# Patient Record
Sex: Female | Born: 1960 | Race: Black or African American | Hispanic: No | Smoking: Never smoker
Health system: Southern US, Community
[De-identification: ages and names within clinical notes are randomized; demographics above are authoritative.]

## PROBLEM LIST (undated history)

## (undated) ENCOUNTER — Emergency Department (HOSPITAL_COMMUNITY)

## (undated) DIAGNOSIS — M1990 Unspecified osteoarthritis, unspecified site: Secondary | ICD-10-CM

## (undated) DIAGNOSIS — J309 Allergic rhinitis, unspecified: Secondary | ICD-10-CM

## (undated) DIAGNOSIS — Z789 Other specified health status: Secondary | ICD-10-CM

## (undated) DIAGNOSIS — R519 Headache, unspecified: Secondary | ICD-10-CM

## (undated) DIAGNOSIS — R51 Headache: Secondary | ICD-10-CM

## (undated) HISTORY — DX: Allergic rhinitis, unspecified: J30.9

---

## 1997-12-15 ENCOUNTER — Ambulatory Visit (HOSPITAL_COMMUNITY): Admission: RE | Admit: 1997-12-15 | Discharge: 1997-12-15 | Payer: Self-pay | Admitting: Obstetrics

## 1997-12-15 ENCOUNTER — Other Ambulatory Visit: Admission: RE | Admit: 1997-12-15 | Discharge: 1997-12-15 | Payer: Self-pay | Admitting: Obstetrics

## 1999-01-25 ENCOUNTER — Other Ambulatory Visit: Admission: RE | Admit: 1999-01-25 | Discharge: 1999-01-25 | Payer: Self-pay | Admitting: Obstetrics and Gynecology

## 1999-03-24 ENCOUNTER — Emergency Department (HOSPITAL_COMMUNITY): Admission: EM | Admit: 1999-03-24 | Discharge: 1999-03-24 | Payer: Self-pay | Admitting: Emergency Medicine

## 1999-03-25 ENCOUNTER — Encounter: Payer: Self-pay | Admitting: Emergency Medicine

## 2002-04-08 ENCOUNTER — Ambulatory Visit (HOSPITAL_COMMUNITY): Admission: RE | Admit: 2002-04-08 | Discharge: 2002-04-08 | Payer: Self-pay | Admitting: Family Medicine

## 2002-04-08 ENCOUNTER — Encounter: Payer: Self-pay | Admitting: Cardiology

## 2005-05-10 ENCOUNTER — Ambulatory Visit (HOSPITAL_COMMUNITY): Admission: RE | Admit: 2005-05-10 | Discharge: 2005-05-10 | Payer: Self-pay | Admitting: Internal Medicine

## 2007-10-16 ENCOUNTER — Encounter: Admission: RE | Admit: 2007-10-16 | Discharge: 2007-10-16 | Payer: Self-pay | Admitting: Family Medicine

## 2007-10-29 ENCOUNTER — Emergency Department (HOSPITAL_COMMUNITY): Admission: EM | Admit: 2007-10-29 | Discharge: 2007-10-30 | Payer: Self-pay | Admitting: Emergency Medicine

## 2007-11-19 ENCOUNTER — Other Ambulatory Visit: Admission: RE | Admit: 2007-11-19 | Discharge: 2007-11-19 | Payer: Self-pay | Admitting: Family Medicine

## 2008-11-25 ENCOUNTER — Ambulatory Visit (HOSPITAL_COMMUNITY): Admission: RE | Admit: 2008-11-25 | Discharge: 2008-11-25 | Payer: Self-pay | Admitting: Family Medicine

## 2010-10-08 ENCOUNTER — Emergency Department (HOSPITAL_COMMUNITY)
Admission: EM | Admit: 2010-10-08 | Discharge: 2010-10-08 | Disposition: A | Discharge status: Home / Self Care | Payer: BC Managed Care – PPO | Attending: Emergency Medicine | Admitting: Emergency Medicine

## 2010-10-08 DIAGNOSIS — W260XXA Contact with knife, initial encounter: Secondary | ICD-10-CM | POA: Insufficient documentation

## 2010-10-08 DIAGNOSIS — W261XXA Contact with sword or dagger, initial encounter: Secondary | ICD-10-CM | POA: Insufficient documentation

## 2010-10-08 DIAGNOSIS — Y92009 Unspecified place in unspecified non-institutional (private) residence as the place of occurrence of the external cause: Secondary | ICD-10-CM | POA: Insufficient documentation

## 2010-10-08 DIAGNOSIS — S51809A Unspecified open wound of unspecified forearm, initial encounter: Secondary | ICD-10-CM | POA: Insufficient documentation

## 2010-10-17 ENCOUNTER — Inpatient Hospital Stay (INDEPENDENT_AMBULATORY_CARE_PROVIDER_SITE_OTHER)
Admission: RE | Admit: 2010-10-17 | Discharge: 2010-10-17 | Disposition: A | Discharge status: Home / Self Care | Payer: BC Managed Care – PPO | Source: Ambulatory Visit | Attending: Emergency Medicine | Admitting: Emergency Medicine

## 2010-10-17 DIAGNOSIS — Z4802 Encounter for removal of sutures: Secondary | ICD-10-CM

## 2011-03-07 LAB — POCT I-STAT, CHEM 8
BUN: 7
Calcium, Ion: 1.06 — ABNORMAL LOW
Chloride: 106
Creatinine, Ser: 0.6
TCO2: 23

## 2011-03-07 LAB — CBC
MCHC: 34.2
MCV: 94.3
Platelets: 210
RDW: 13.5
WBC: 10.3

## 2011-03-07 LAB — DIFFERENTIAL
Basophils Absolute: 0
Basophils Relative: 0
Eosinophils Absolute: 0.1
Eosinophils Relative: 1
Neutrophils Relative %: 81 — ABNORMAL HIGH

## 2011-03-07 LAB — D-DIMER, QUANTITATIVE: D-Dimer, Quant: 0.3

## 2011-03-07 LAB — POCT CARDIAC MARKERS
Myoglobin, poc: 38
Operator id: 277751
Troponin i, poc: <0.05

## 2012-02-02 ENCOUNTER — Encounter (HOSPITAL_COMMUNITY): Payer: Self-pay | Admitting: *Deleted

## 2012-02-02 ENCOUNTER — Inpatient Hospital Stay (HOSPITAL_COMMUNITY)
Admission: AD | Admit: 2012-02-02 | Discharge: 2012-02-02 | Disposition: A | Discharge status: Home / Self Care | Payer: BC Managed Care – PPO | Source: Ambulatory Visit | Attending: Obstetrics & Gynecology | Admitting: Obstetrics & Gynecology

## 2012-02-02 DIAGNOSIS — R51 Headache: Secondary | ICD-10-CM | POA: Insufficient documentation

## 2012-02-02 DIAGNOSIS — R110 Nausea: Secondary | ICD-10-CM | POA: Insufficient documentation

## 2012-02-02 DIAGNOSIS — R197 Diarrhea, unspecified: Secondary | ICD-10-CM | POA: Insufficient documentation

## 2012-02-02 DIAGNOSIS — A084 Viral intestinal infection, unspecified: Secondary | ICD-10-CM

## 2012-02-02 DIAGNOSIS — R109 Unspecified abdominal pain: Secondary | ICD-10-CM | POA: Insufficient documentation

## 2012-02-02 HISTORY — DX: Other specified health status: Z78.9

## 2012-02-02 LAB — URINALYSIS, ROUTINE W REFLEX MICROSCOPIC
Bilirubin Urine: NEGATIVE
Glucose, UA: NEGATIVE mg/dL
Ketones, ur: >80 mg/dL — AB
Leukocytes, UA: NEGATIVE
Nitrite: NEGATIVE
Protein, ur: NEGATIVE mg/dL
Specific Gravity, Urine: >1.03 — ABNORMAL HIGH (ref 1.005–1.030)
Urobilinogen, UA: 0.2 mg/dL (ref 0.0–1.0)
pH: 6 (ref 5.0–8.0)

## 2012-02-02 LAB — CBC WITH DIFFERENTIAL/PLATELET
Basophils Relative: 0 % (ref 0–1)
Eosinophils Absolute: 0 10*3/uL (ref 0.0–0.7)
Eosinophils Relative: 0 % (ref 0–5)
Hemoglobin: 12.7 g/dL (ref 12.0–15.0)
Lymphs Abs: 0.7 10*3/uL (ref 0.7–4.0)
MCH: 29.8 pg (ref 26.0–34.0)
MCHC: 32.2 g/dL (ref 30.0–36.0)
MCV: 92.5 fL (ref 78.0–100.0)
Monocytes Absolute: 0.6 10*3/uL (ref 0.1–1.0)
Monocytes Relative: 8 % (ref 3–12)
Neutrophils Relative %: 85 % — ABNORMAL HIGH (ref 43–77)
RBC: 4.26 MIL/uL (ref 3.87–5.11)

## 2012-02-02 LAB — URINE MICROSCOPIC-ADD ON

## 2012-02-02 LAB — BASIC METABOLIC PANEL
BUN: 10 mg/dL (ref 6–23)
Calcium: 8.3 mg/dL — ABNORMAL LOW (ref 8.4–10.5)
Creatinine, Ser: 0.52 mg/dL (ref 0.50–1.10)
GFR calc Af Amer: >90 mL/min (ref >90)
GFR calc non Af Amer: >90 mL/min (ref >90)
Glucose, Bld: 101 mg/dL — ABNORMAL HIGH (ref 70–99)

## 2012-02-02 MED ORDER — LOPERAMIDE HCL 2 MG PO CAPS
2.0000 mg | ORAL_CAPSULE | ORAL | Status: DC | PRN
  Administered 2012-02-02: 2 mg via ORAL
  Filled 2012-02-02: qty 1
  Filled 2012-02-02: qty 2

## 2012-02-02 MED ORDER — ACETAMINOPHEN 325 MG PO TABS
325.0000 mg | ORAL_TABLET | Freq: Once | ORAL | Status: AC
  Administered 2012-02-02: 325 mg via ORAL
  Filled 2012-02-02 (×2): qty 1

## 2012-02-02 MED ORDER — SODIUM CHLORIDE 0.9 % IV SOLN
25.0000 mg | Freq: Once | INTRAVENOUS | Status: AC
  Administered 2012-02-02: 25 mg via INTRAVENOUS
  Filled 2012-02-02: qty 1

## 2012-02-02 MED ORDER — LOPERAMIDE HCL 2 MG PO CAPS: 2.0000 mg | ORAL_CAPSULE | Freq: Four times a day (QID) | ORAL | Status: DC | PRN

## 2012-02-02 MED ORDER — LOPERAMIDE HCL 2 MG PO CAPS: 2.0000 mg | ORAL_CAPSULE | Freq: Four times a day (QID) | ORAL | Status: AC | PRN

## 2012-02-02 MED ORDER — DEXTROSE-NACL 5-0.9 % IV SOLN: INTRAVENOUS | Status: DC

## 2012-02-02 MED ORDER — ONDANSETRON 8 MG PO TBDP: 8.0000 mg | ORAL_TABLET | Freq: Three times a day (TID) | ORAL | Status: AC | PRN

## 2012-02-02 MED ORDER — PEDIALYTE PO SOLN: 240.0000 mL | Freq: Four times a day (QID) | ORAL | Status: DC

## 2012-02-02 NOTE — MAU Note (Signed)
In to give PO meds and pt vomited large amt in trash can. Will give IVFs . Pharmacy called at 0725 about IVFs.

## 2012-02-02 NOTE — MAU Note (Signed)
Woke up Friday with h/a, nausea, diarrhea. Didn't eat all day Friday. Did drink ginger ale and water on Fri

## 2012-02-02 NOTE — MAU Provider Note (Signed)
History     CSN: [id number]  Arrival date and time: 02/02/12 7829   First Provider Initiated Contact with Patient 02/02/12 [phone]      Chief Complaint  Patient presents with  . Headache  . Nausea  . Diarrhea   HPI This is a 51 y.o. [id number] here with nausea and diarrhea x 3 since yesterday morning.  Pt states she has been unable to eat but has tried drinking fluids throughout the day yesterday.  Has abdominal pain though cannot elaborate quality or quantity.  Menstruation also started yesterday morning, but never had these symptoms with prior menstruation, LMP 6 mo ago (going through menopause).  Nothing makes pt feel better or worse.  Has sick contact of pregnant daughter with similar symptoms (nausea/diarrhea) 2 days ago who came to MAU, given IV fluids and immodium, and sent home, no longer with diarrhea but still feeling poorly.    Also reports fatigue, chills, subjective fevers, chronic epigastric pain, 2 mo of back pain and chronic frontal headache at 4-5/10.  Denies neurologic changes other than chronic blurry vision.  Also denies confusion, upper respiratory symptoms, vomiting,   Pt somewhat a poor historian, ?chronically v due to not feeling well now.  Mentation at baseline per daughter.  PCP - Dr. Parke Simmers on [facility].  Past Medical History  Diagnosis Date  . No pertinent past medical history   Irregular menstruation with LMP 6 mo ago.  Past Surgical History  Procedure Date  . Cesarean section   x 3, no other surgeries  Family History  Problem Relation Age of Onset  . Other Neg Hx   Mother - esophageal cancer at 61 yo Maternal GM - DM, cerebral aneurysm  History  Substance Use Topics  .    Marland Kitchen Smokeless tobacco: Not on file  . Alcohol Use: No  Smoker x ~1 year years ago Lives with 3 daughters and their children, 6 people total, denies DV Works at [employer]  Allergies:  Allergies  Allergen Reactions  . Latex Rash    No prescriptions prior to admission      ROS Per HPI; otherwise, negative Physical Exam   Blood pressure 122/70, pulse 93, temperature 99.3 F (37.4 C), temperature source Oral, resp. rate 20, last menstrual period 02/01/2012.  Physical Exam GEN: AA female lying in bed in NAD, though appears fatigued and answers questions with some prompting HEENT: PERRL, EOMI, dry mucous membranes CV: RRR, 2+ bilateral DP pulses, brisk capillary refill PULM: CTAB, no increased WOB ABD: soft, mild tenderness to palpation diffusely, nondistended, slightly hyperactive bowel sounds EXTR: no bilateral LE edema NEURO: CN 2-12 tested and in tact, alert and oriented x 3   MAU Course  Procedures  MDM  Results for orders placed during the hospital encounter of 02/02/12 (from the past 24 hour(s))  URINALYSIS, ROUTINE W REFLEX MICROSCOPIC     Status: Abnormal   Collection Time   02/02/12  5:45 AM      Component Value Range   Color, Urine YELLOW  YELLOW   APPearance CLEAR  CLEAR   Specific Gravity, Urine >1.030 (*) 1.005 - 1.030   pH 6.0  5.0 - 8.0   Glucose, UA NEGATIVE  NEGATIVE mg/dL   Hgb urine dipstick LARGE (*) NEGATIVE   Bilirubin Urine NEGATIVE  NEGATIVE   Ketones, ur >80 (*) NEGATIVE mg/dL   Protein, ur NEGATIVE  NEGATIVE mg/dL   Urobilinogen, UA 0.2  0.0 - 1.0 mg/dL   Nitrite NEGATIVE  NEGATIVE   Leukocytes, UA NEGATIVE  NEGATIVE  URINE MICROSCOPIC-ADD ON     Status: Abnormal   Collection Time   02/02/12  5:45 AM      Component Value Range   Squamous Epithelial / LPF FEW (*) RARE   WBC, UA 0-2  <3 WBC/hpf   RBC / HPF 21-50  <3 RBC/hpf   Bacteria, UA FEW (*) RARE   Urine-Other MUCOUS PRESENT     - Ordered tylenol for headache, imodium, and rehydrating with IV fluids with phenergan. - CBC and BMET ordered  Assessment and Plan   This is a 51 y.o. [id number] here with nausea and diarrhea x 3 since yesterday morning.  # Diarrhea and abdominal pain: Likely a viral gastroenteritis given symptoms and sick contact.  Pt  clinically appears dehydrated given dry mucous membranes and concentrated urine.  - 1L NS with phenergan in MAU - CBC and BMET are WNL - imodium for diarrhea - expectant management and encourage hydration with gatorade or pedialyte - f/u with PCP if no improvement in 1-2 days  # Headache, chronic: Consider migraine  - tylenol in MAU - f/u with PCP Dr. Parke Simmers, as this appears chronic with no current worsening or neurologic symptoms  # Health maintenance: - f/u chronic epigastric pain with PCP - potential GERD?  [name] 02/02/2012, 6:50 AM

## 2012-02-08 NOTE — MAU Provider Note (Signed)
I examined pt and agree with documentation above and resident plan of care. MUHAMMAD,Jauan Wohl  

## 2012-07-15 ENCOUNTER — Other Ambulatory Visit (HOSPITAL_COMMUNITY)
Admission: RE | Admit: 2012-07-15 | Discharge: 2012-07-15 | Disposition: A | Discharge status: Home / Self Care | Payer: BC Managed Care – PPO | Source: Ambulatory Visit | Attending: Family Medicine | Admitting: Family Medicine

## 2012-07-15 ENCOUNTER — Other Ambulatory Visit: Payer: Self-pay | Admitting: Family Medicine

## 2012-07-15 DIAGNOSIS — Z01419 Encounter for gynecological examination (general) (routine) without abnormal findings: Secondary | ICD-10-CM | POA: Insufficient documentation

## 2012-07-15 DIAGNOSIS — Z1151 Encounter for screening for human papillomavirus (HPV): Secondary | ICD-10-CM | POA: Insufficient documentation

## 2012-09-07 ENCOUNTER — Emergency Department (HOSPITAL_COMMUNITY)
Admission: EM | Admit: 2012-09-07 | Discharge: 2012-09-07 | Disposition: A | Discharge status: Home / Self Care | Payer: BC Managed Care – PPO | Attending: Emergency Medicine | Admitting: Emergency Medicine

## 2012-09-07 ENCOUNTER — Encounter (HOSPITAL_COMMUNITY): Payer: Self-pay | Admitting: *Deleted

## 2012-09-07 DIAGNOSIS — R21 Rash and other nonspecific skin eruption: Secondary | ICD-10-CM | POA: Insufficient documentation

## 2012-09-07 DIAGNOSIS — IMO0002 Reserved for concepts with insufficient information to code with codable children: Secondary | ICD-10-CM | POA: Insufficient documentation

## 2012-09-07 DIAGNOSIS — L03114 Cellulitis of left upper limb: Secondary | ICD-10-CM

## 2012-09-07 MED ORDER — SULFAMETHOXAZOLE-TRIMETHOPRIM 800-160 MG PO TABS: 1.0000 | ORAL_TABLET | Freq: Two times a day (BID) | ORAL | Status: DC

## 2012-09-07 MED ORDER — TRAMADOL HCL 50 MG PO TABS: 50.0000 mg | ORAL_TABLET | Freq: Four times a day (QID) | ORAL | Status: DC | PRN

## 2012-09-07 MED ORDER — SULFAMETHOXAZOLE-TMP DS 800-160 MG PO TABS
1.0000 | ORAL_TABLET | Freq: Once | ORAL | Status: AC
  Administered 2012-09-07: 1 via ORAL
  Filled 2012-09-07: qty 1

## 2012-09-07 MED ORDER — CEPHALEXIN 500 MG PO CAPS: 500.0000 mg | ORAL_CAPSULE | Freq: Four times a day (QID) | ORAL | Status: DC

## 2012-09-07 MED ORDER — CEPHALEXIN 250 MG PO CAPS
500.0000 mg | ORAL_CAPSULE | Freq: Once | ORAL | Status: AC
  Administered 2012-09-07: 500 mg via ORAL
  Filled 2012-09-07: qty 2

## 2012-09-07 MED ORDER — ACETAMINOPHEN 325 MG PO TABS
650.0000 mg | ORAL_TABLET | Freq: Once | ORAL | Status: AC
  Administered 2012-09-07: 650 mg via ORAL
  Filled 2012-09-07: qty 2

## 2012-09-07 NOTE — ED Provider Notes (Signed)
History     CSN: [id number]  Arrival date & time 09/07/12  1027   First MD Initiated Contact with Patient 09/07/12 1120      Chief Complaint  Patient presents with  . Abscess    (Consider location/radiation/quality/duration/timing/severity/associated sxs/prior treatment) Patient is a 52 y.o. female presenting with abscess.  Abscess Associated symptoms: no fever, no nausea and no vomiting     Samantha Banks is a 52 y.o. female complaining of Painful swelling to left forearm onset 2 days ago. Patient's reports that she is being bitten by bugs or possibly spiders and that she has frequent bug bites to turn into abscesses and cellulitis. Denies fever, nausea vomiting. She endorses a pins and needles paresthesia distal to the lesion.  Past Medical History  Diagnosis Date  . No pertinent past medical history     Past Surgical History  Procedure Laterality Date  . Cesarean section      Family History  Problem Relation Age of Onset  . Other Neg Hx     History  Substance Use Topics  . Smoking status: Never Smoker   . Smokeless tobacco: Not on file  . Alcohol Use: No    OB History   Grav Para Term Preterm Abortions TAB SAB Ect Mult Living   3 3 3  0 0 0 0 0 0 3      Review of Systems  Constitutional: Negative for fever.  Respiratory: Negative for shortness of breath.   Cardiovascular: Negative for chest pain.  Gastrointestinal: Negative for nausea, vomiting, abdominal pain and diarrhea.  Skin: Positive for rash.  All other systems reviewed and are negative.    Allergies  Latex  Home Medications  No current outpatient prescriptions on file.  BP 121/57  Pulse 76  Temp(Src) 98 F (36.7 C) (Oral)  Resp 18  SpO2 100%  Physical Exam  Nursing note and vitals reviewed. Constitutional: She is oriented to person, place, and time. She appears well-developed and well-nourished. No distress.  HENT:  Head: Normocephalic.  Eyes: Conjunctivae and EOM are normal.   Cardiovascular: Normal rate.   Pulmonary/Chest: Effort normal. No stridor.  Musculoskeletal: Normal range of motion.       Arms: Neurological: She is alert and oriented to person, place, and time.  Psychiatric: She has a normal mood and affect.      ED Course  Procedures (including critical care time)  Labs Reviewed - No data to display No results found.  Medications  acetaminophen (TYLENOL) tablet 650 mg (not administered)  cephALEXin (KEFLEX) capsule 500 mg (not administered)  sulfamethoxazole-trimethoprim (BACTRIM DS) 800-160 MG per tablet 1 tablet (not administered)    1. Cellulitis of arm, left       MDM   Samantha Banks is a 52 y.o. female 5 cm left forearm cellulitis and no fluctuance.   Filed Vitals:   09/07/12 1118  BP: 121/57  Pulse: 76  Temp: 98 F (36.7 C)  TempSrc: Oral  Resp: 18  SpO2: 100%     Pt verbalized understanding and agrees with care plan. Outpatient follow-up and return precautions given.    New Prescriptions   CEPHALEXIN (KEFLEX) 500 MG CAPSULE    Take 1 capsule (500 mg total) by mouth 4 (four) times daily.   SULFAMETHOXAZOLE-TRIMETHOPRIM (SEPTRA DS) 800-160 MG PER TABLET    Take 1 tablet by mouth every 12 (twelve) hours.   TRAMADOL (ULTRAM) 50 MG TABLET    Take 1 tablet (50 mg total) by mouth  every 6 (six) hours as needed for pain.           Samantha Emery, [name] 09/07/12 1142

## 2012-09-07 NOTE — ED Notes (Signed)
Reports possible bug bites, having abscesses frequently, just had one on leg that healed and now has two on left arm. No distress noted at triage.

## 2012-09-08 NOTE — ED Provider Notes (Signed)
Medical screening examination/treatment/procedure(s) were performed by non-physician practitioner and as supervising physician I was immediately available for consultation/collaboration.  Flint Melter, MD 09/08/12 [phone]

## 2012-12-16 ENCOUNTER — Emergency Department: Payer: Self-pay | Admitting: Unknown Physician Specialty

## 2012-12-16 LAB — URINALYSIS, COMPLETE
Bilirubin,UR: NEGATIVE
Glucose,UR: NEGATIVE mg/dL (ref 0–75)
Ketone: NEGATIVE
Nitrite: NEGATIVE
Ph: 7 (ref 4.5–8.0)
Protein: 100
RBC,UR: 512 /HPF (ref 0–5)
Squamous Epithelial: <1

## 2012-12-17 LAB — GC/CHLAMYDIA PROBE AMP

## 2012-12-18 LAB — URINE CULTURE

## 2013-04-30 ENCOUNTER — Other Ambulatory Visit: Payer: Self-pay | Admitting: Family Medicine

## 2013-04-30 ENCOUNTER — Other Ambulatory Visit (HOSPITAL_COMMUNITY)
Admission: RE | Admit: 2013-04-30 | Discharge: 2013-04-30 | Disposition: A | Discharge status: Home / Self Care | Payer: BC Managed Care – PPO | Source: Ambulatory Visit | Attending: Family Medicine | Admitting: Family Medicine

## 2013-04-30 DIAGNOSIS — Z113 Encounter for screening for infections with a predominantly sexual mode of transmission: Secondary | ICD-10-CM | POA: Insufficient documentation

## 2013-04-30 DIAGNOSIS — N760 Acute vaginitis: Secondary | ICD-10-CM | POA: Insufficient documentation

## 2013-06-17 ENCOUNTER — Ambulatory Visit
Admission: RE | Admit: 2013-06-17 | Discharge: 2013-06-17 | Disposition: A | Discharge status: Home / Self Care | Payer: BC Managed Care – PPO | Source: Ambulatory Visit | Attending: Family Medicine | Admitting: Family Medicine

## 2013-06-17 ENCOUNTER — Other Ambulatory Visit: Payer: Self-pay | Admitting: Family Medicine

## 2013-06-17 DIAGNOSIS — R52 Pain, unspecified: Secondary | ICD-10-CM

## 2013-08-21 ENCOUNTER — Encounter (HOSPITAL_COMMUNITY): Payer: Self-pay | Admitting: Emergency Medicine

## 2013-08-21 ENCOUNTER — Emergency Department (HOSPITAL_COMMUNITY)
Admission: EM | Admit: 2013-08-21 | Discharge: 2013-08-21 | Disposition: A | Discharge status: Home / Self Care | Payer: BC Managed Care – PPO | Attending: Emergency Medicine | Admitting: Emergency Medicine

## 2013-08-21 DIAGNOSIS — Z91040 Latex allergy status: Secondary | ICD-10-CM | POA: Insufficient documentation

## 2013-08-21 DIAGNOSIS — M79609 Pain in unspecified limb: Secondary | ICD-10-CM | POA: Insufficient documentation

## 2013-08-21 DIAGNOSIS — M79606 Pain in leg, unspecified: Secondary | ICD-10-CM

## 2013-08-21 MED ORDER — ENOXAPARIN SODIUM 80 MG/0.8ML ~~LOC~~ SOLN
65.0000 mg | Freq: Once | SUBCUTANEOUS | Status: AC
  Administered 2013-08-21: 65 mg via SUBCUTANEOUS
  Filled 2013-08-21: qty 0.8

## 2013-08-21 NOTE — ED Provider Notes (Signed)
CSN: [id number]     Arrival date & time 08/21/13  2225 History  This chart was scribed for non-physician practitioner Montine Circle, [name] working with Osvaldo Shipper, MD by Eston Mould, ED Scribe. This patient was seen in room TR10C/TR10C and the patient's care was started at 10:41 PM .   Chief Complaint  Patient presents with  . Leg Pain   The history is provided by the patient. No language interpreter was used.   HPI Comments: Samantha Banks is a 53 y.o. female who presents to the Emergency Department complaining of sudden onset worsening L thigh pain that began today. She suspects she may be having "circulation problems". Pt denies having any injuries or traumas to L leg. Pt denies having medical conditions or taking daily medications.  Past Medical History  Diagnosis Date  . No pertinent past medical history    Past Surgical History  Procedure Laterality Date  . Cesarean section     Family History  Problem Relation Age of Onset  . Other Neg Hx    History  Substance Use Topics  . Smoking status: Never Smoker   . Smokeless tobacco: Not on file  . Alcohol Use: No   OB History   Grav Para Term Preterm Abortions TAB SAB Ect Mult Living   3 3 3  0 0 0 0 0 0 3     Review of Systems  Musculoskeletal: Negative for gait problem.  Skin: Negative for color change.    Allergies  Latex  Home Medications   Current Outpatient Rx  Name  Route  Sig  Dispense  Refill  . cephALEXin (KEFLEX) 500 MG capsule   Oral   Take 1 capsule (500 mg total) by mouth 4 (four) times daily.   20 capsule   0   . sulfamethoxazole-trimethoprim (SEPTRA DS) 800-160 MG per tablet   Oral   Take 1 tablet by mouth every 12 (twelve) hours.   14 tablet   0   . traMADol (ULTRAM) 50 MG tablet   Oral   Take 1 tablet (50 mg total) by mouth every 6 (six) hours as needed for pain.   15 tablet   0    BP 135/51  Pulse 70  Temp(Src) 97.6 F (36.4 C) (Oral)  Resp 20  SpO2 99%   LMP 02/01/2012  Physical Exam  Nursing note and vitals reviewed. Constitutional: She is oriented to person, place, and time. She appears well-developed and well-nourished. No distress.  HENT:  Head: Normocephalic and atraumatic.  Eyes: EOM are normal.  Neck: Neck supple. No tracheal deviation present.  Cardiovascular: Normal rate and intact distal pulses.   Pulmonary/Chest: Effort normal. No respiratory distress.  Musculoskeletal: Normal range of motion.  L leg non tender to pal, ROM and strength 5/5. No unilateral leg swelling. No evidence of DVT. No cellulitis or signs of septic joint.  Neurological: She is alert and oriented to person, place, and time.  Sensation intact  Skin: Skin is warm and dry.  Psychiatric: She has a normal mood and affect. Her behavior is normal.   ED Course  Procedures DIAGNOSTIC STUDIES: Oxygen Saturation is 99% on RA, normal by my interpretation.    COORDINATION OF CARE: 10:44 PM-Discussed treatment plan which includes administer Lovenox  shot while in ED and advised pt to return in the morning for ultrasound. Pt agreed to plan.   Labs Review Labs Reviewed - No data to display Imaging Review No results found.   EKG Interpretation  None     MDM   Final diagnoses:  Leg pain   Patient with left leg pain. No obvious injury or deformity. Doubt DVT, but the patient is concerned that she has one. I've given her a Lovenox shot, and recommend ultrasound tomorrow morning. Patient understands and agrees with the plan. She ambulates without difficulty. There is no evidence of infection. She is stable and ready for discharge.  I personally performed the services described in this documentation, which was scribed in my presence. The recorded information has been reviewed and is accurate.     Montine Circle, [name] 08/21/13 2351

## 2013-08-21 NOTE — ED Notes (Signed)
Pt reports left thigh pain x 1 week. States pain has gotten worse in the last couple of hours. States that she noticed a bruise a week ago and that her thigh has gotten hotter and more swollen. Left and right thigh appear same in size and temperature at this time. 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

## 2013-08-21 NOTE — Discharge Instructions (Signed)
Return to the ER tomorrow for and ultrasound on your leg.  Deep Vein Thrombosis A deep vein thrombosis (DVT) is a blood clot that develops in the deep, larger veins of the leg, arm, or pelvis. These are more dangerous than clots that might form in veins near the surface of the body. A DVT can lead to complications if the clot breaks off and travels in the bloodstream to the lungs.  A DVT can damage the valves in your leg veins, so that instead of flowing upward, the blood pools in the lower leg. This is called post-thrombotic syndrome, and it can result in pain, swelling, discoloration, and sores on the leg. CAUSES Usually, several things contribute to blood clots forming. Contributing factors include:  The flow of blood slows down.  The inside of the vein is damaged in some way.  You have a condition that makes blood clot more easily. RISK FACTORS Some people are more likely than others to develop blood clots. Risk factors include:   Older age, especially over 75 years of age.  Having a family history of blood clots or if you have already had a blot clot.  Having major or lengthy surgery. This is especially true for surgery on the hip, knee, or belly (abdomen). Hip surgery is particularly high risk.  Breaking a hip or leg.  Sitting or lying still for a long time. This includes long-distance travel, paralysis, or recovery from an illness or surgery.  Having cancer or cancer treatment.  Having a long, thin tube (catheter) placed inside a vein during a medical procedure.  Being overweight (obese).  Pregnancy and childbirth.  Hormone changes make the blood clot more easily during pregnancy.  The fetus puts pressure on the veins of the pelvis.  There is a risk of injury to veins during delivery or a caesarean. The risk is highest just after childbirth.  Medicines with the female hormone estrogen. This includes birth control pills and hormone replacement  therapy.  Smoking.  Other circulation or heart problems.  SIGNS AND SYMPTOMS When a clot forms, it can either partially or totally block the blood flow in that vein. Symptoms of a DVT can include:  Swelling of the leg or arm, especially if one side is much worse.  Warmth and redness of the leg or arm, especially if one side is much worse.  Pain in an arm or leg. If the clot is in the leg, symptoms may be more noticeable or worse when standing or walking. The symptoms of a DVT that has traveled to the lungs (pulmonary embolism, PE) usually start suddenly and include:  Shortness of breath.  Coughing.  Coughing up blood or blood-tinged phlegm.  Chest pain. The chest pain is often worse with deep breaths.  Rapid heartbeat. Anyone with these symptoms should get emergency medical treatment right away. Call your local emergency services (911 in the [place] if you have these symptoms. DIAGNOSIS If a DVT is suspected, your health care provider will take a full medical history and perform a physical exam. Tests that also may be required include:  Blood tests, including studies of the clotting properties of the blood.  Ultrasonography to see if you have clots in your legs or lungs.  X-rays to show the flow of blood when dye is injected into the veins (venography).  Studies of your lungs if you have any chest symptoms. PREVENTION  Exercise the legs regularly. Take a brisk 30-minute walk every day.  Maintain a weight that  is appropriate for your height.  Avoid sitting or lying in bed for long periods of time without moving your legs.  Women, particularly those over the age of 55 years, should consider the risks and benefits of taking estrogen medicines, including birth control pills.  Do not smoke, especially if you take estrogen medicines.  Long-distance travel can increase your risk of DVT. You should exercise your legs by walking or pumping the muscles every hour.  In-hospital  prevention:  Many of the risk factors above relate to situations that exist with hospitalization, either for illness, injury, or elective surgery.  Your health care provider will assess you for the need for venous thromboembolism prophylaxis when you are admitted to the hospital. If you are having surgery, your surgeon will assess you the day of or day after surgery.  Prevention may include medical and nonmedical measures. TREATMENT Once identified, a DVT can be treated. It can also be prevented in some circumstances. Once you have had a DVT, you may be at increased risk for a DVT in the future. The most common treatment for DVT is blood thinning (anticoagulant) medicine, which reduces the blood's tendency to clot. Anticoagulants can stop new blood clots from forming and stop old ones from growing. They cannot dissolve existing clots. Your body does this by itself over time. Anticoagulants can be given by mouth, by IV access, or by injection. Your health care provider will determine the best program for you. Other medicines or treatments that may be used are:  Heparin or related medicines (low molecular weight heparin) are usually the first treatment for a blood clot. They act quickly. However, they cannot be taken orally.  Heparin can cause a fall in a component of blood that stops bleeding and forms blood clots (platelets). You will be monitored with blood tests to be sure this does not occur.  Warfarin is an anticoagulant that can be swallowed. It takes a few days to start working, so usually heparin or related medicines are used in combination. Once warfarin is working, heparin is usually stopped.  Less commonly, clot dissolving drugs (thrombolytics) are used to dissolve a DVT. They carry a high risk of bleeding, so they are used mainly in severe cases, where your life or a limb is threatened.  Very rarely, a blood clot in the leg needs to be removed surgically.  If you are unable to take  anticoagulants, your health care provider may arrange for you to have a filter placed in a main vein in your abdomen. This filter prevents clots from traveling to your lungs. HOME CARE INSTRUCTIONS  Take all medicines prescribed by your health care provider. Only take over-the-counter or prescription medicines for pain, fever, or discomfort as directed by your health care provider.  Warfarin. Most people will continue taking warfarin after hospital discharge. Your health care provider will advise you on the length of treatment (usually 3 6 months, sometimes lifelong).  Too much and too little warfarin are both dangerous. Too much warfarin increases the risk of bleeding. Too little warfarin continues to allow the risk for blood clots. While taking warfarin, you will need to have regular blood tests to measure your blood clotting time. These blood tests usually include both the prothrombin time (PT) and international normalized ratio (INR) tests. The PT and INR results allow your health care provider to adjust your dose of warfarin. The dose can change for many reasons. It is critically important that you take warfarin exactly as prescribed, and  that you have your PT and INR levels drawn exactly as directed.  Many foods, especially foods high in vitamin K, can interfere with warfarin and affect the PT and INR results. Foods high in vitamin K include spinach, kale, broccoli, cabbage, collard and turnip greens, brussel sprouts, peas, cauliflower, seaweed, and parsley as well as beef and pork liver, green tea, and soybean oil. You should eat a consistent amount of foods high in vitamin K. Avoid major changes in your diet, or notify your health care provider before changing your diet. Arrange a visit with a dietitian to answer your questions.  Many medicines can interfere with warfarin and affect the PT and INR results. You must tell your health care provider about any and all medicines you take. This includes  all vitamins and supplements. Be especially cautious with aspirin and anti-inflammatory medicines. Ask your health care provider before taking these. Do not take or discontinue any prescribed or over-the-counter medicine except on the advice of your health care provider or pharmacist.  Warfarin can have side effects, primarily excessive bruising or bleeding. You will need to hold pressure over cuts for longer than usual. Your health care provider or pharmacist will discuss other potential side effects.  Alcohol can change the body's ability to handle warfarin. It is best to avoid alcoholic drinks or consume only very small amounts while taking warfarin. Notify your health care provider if you change your alcohol intake.  Notify your dentist or other health care providers before procedures.  Activity. Ask your health care provider how soon you can go back to normal activities. It is important to stay active to prevent blood clots. If you are on anticoagulant medicine, avoid contact sports.  Exercise. It is very important to exercise. This is especially important while traveling, sitting, or standing for long periods of time. Exercise your legs by walking or by pumping the muscles frequently. Take frequent walks.  Compression stockings. These are tight elastic stockings that apply pressure to the lower legs. This pressure can help keep the blood in the legs from clotting. You may need to wear compression stockings at home to help prevent a DVT.  Do not smoke. If you smoke, quit. Ask your health care provider for help with quitting smoking.  Learn as much as you can about DVT. Knowing more about the condition should help you keep it from coming back.  Wear a medical alert bracelet or carry a medical alert card. SEEK MEDICAL CARE IF:  You notice a rapid heartbeat.  You feel weaker or more tired than usual.  You feel faint.  You notice increased bruising.  You feel your symptoms are not  getting better in the time expected.  You believe you are having side effects of medicine. SEEK IMMEDIATE MEDICAL CARE IF:  You have chest pain.  You have trouble breathing.  You have new or increased swelling or pain in one leg.  You cough up blood.  You notice blood in vomit, in a bowel movement, or in urine. MAKE SURE YOU:  Understand these instructions.  Will watch your condition.  Will get help right away if you are not doing well or get worse. Document Released: 05/28/2005 Document Revised: 03/18/2013 Document Reviewed: 02/02/2013 [facility] Patient Information 2014 [place].

## 2013-08-21 NOTE — ED Notes (Signed)
Lt thigh pain for one week and she thinks the leg is swollen with more pain today.  No known injury.  The lt leg appears to be the size of the rt leg

## 2013-08-22 ENCOUNTER — Ambulatory Visit (HOSPITAL_COMMUNITY)
Admission: RE | Admit: 2013-08-22 | Discharge: 2013-08-22 | Disposition: A | Discharge status: Home / Self Care | Payer: BC Managed Care – PPO | Source: Ambulatory Visit | Attending: Emergency Medicine | Admitting: Emergency Medicine

## 2013-08-22 DIAGNOSIS — M79609 Pain in unspecified limb: Secondary | ICD-10-CM | POA: Insufficient documentation

## 2013-08-22 NOTE — ED Provider Notes (Signed)
Medical screening examination/treatment/procedure(s) were performed by non-physician practitioner and as supervising physician I was immediately available for consultation/collaboration.   EKG Interpretation None        Osvaldo Shipper, MD 08/22/13 2300

## 2013-08-22 NOTE — Progress Notes (Signed)
VASCULAR LAB PRELIMINARY  PRELIMINARY  PRELIMINARY  PRELIMINARY  Left lower extremity venous Doppler completed.    Preliminary report:  There is no DVT or SVT noted in the left lower extremity.  Halena Mohar, RVT 08/22/2013, 8:47 AM

## 2014-03-01 ENCOUNTER — Other Ambulatory Visit (HOSPITAL_COMMUNITY)
Admission: RE | Admit: 2014-03-01 | Discharge: 2014-03-01 | Disposition: A | Discharge status: Home / Self Care | Payer: BC Managed Care – PPO | Source: Ambulatory Visit | Attending: Family Medicine | Admitting: Family Medicine

## 2014-03-01 ENCOUNTER — Other Ambulatory Visit: Payer: BC Managed Care – PPO | Admitting: Family Medicine

## 2014-03-01 DIAGNOSIS — Z113 Encounter for screening for infections with a predominantly sexual mode of transmission: Secondary | ICD-10-CM | POA: Insufficient documentation

## 2014-03-01 DIAGNOSIS — N760 Acute vaginitis: Secondary | ICD-10-CM | POA: Insufficient documentation

## 2014-03-04 ENCOUNTER — Other Ambulatory Visit: Payer: Self-pay | Admitting: Family Medicine

## 2014-03-04 ENCOUNTER — Other Ambulatory Visit: Payer: Self-pay

## 2014-03-04 ENCOUNTER — Ambulatory Visit
Admission: RE | Admit: 2014-03-04 | Discharge: 2014-03-04 | Disposition: A | Discharge status: Home / Self Care | Payer: BC Managed Care – PPO | Source: Ambulatory Visit | Attending: Family Medicine | Admitting: Family Medicine

## 2014-03-04 DIAGNOSIS — M1991 Primary osteoarthritis, unspecified site: Secondary | ICD-10-CM

## 2014-03-05 LAB — URINE CYTOLOGY ANCILLARY ONLY
BACTERIAL VAGINITIS: POSITIVE — AB
Candida vaginitis: NEGATIVE
Chlamydia: NEGATIVE
Neisseria Gonorrhea: NEGATIVE
Trichomonas: POSITIVE — AB

## 2014-03-13 ENCOUNTER — Emergency Department (HOSPITAL_COMMUNITY)
Admission: EM | Admit: 2014-03-13 | Discharge: 2014-03-13 | Disposition: A | Discharge status: Home / Self Care | Payer: BC Managed Care – PPO | Attending: Emergency Medicine | Admitting: Emergency Medicine

## 2014-03-13 ENCOUNTER — Encounter (HOSPITAL_COMMUNITY): Payer: Self-pay | Admitting: Emergency Medicine

## 2014-03-13 DIAGNOSIS — Z91040 Latex allergy status: Secondary | ICD-10-CM | POA: Insufficient documentation

## 2014-03-13 DIAGNOSIS — R0789 Other chest pain: Secondary | ICD-10-CM | POA: Insufficient documentation

## 2014-03-13 DIAGNOSIS — R079 Chest pain, unspecified: Secondary | ICD-10-CM | POA: Diagnosis present

## 2014-03-13 LAB — CBC
HCT: 41 % (ref 36.0–46.0)
HEMOGLOBIN: 13.6 g/dL (ref 12.0–15.0)
MCH: 29.9 pg (ref 26.0–34.0)
MCHC: 33.2 g/dL (ref 30.0–36.0)
MCV: 90.1 fL (ref 78.0–100.0)
Platelets: 221 10*3/uL (ref 150–400)
RBC: 4.55 MIL/uL (ref 3.87–5.11)
RDW: 13.6 % (ref 11.5–15.5)
WBC: 7.5 10*3/uL (ref 4.0–10.5)

## 2014-03-13 LAB — I-STAT TROPONIN, ED: TROPONIN I, POC: 0 ng/mL (ref 0.00–0.08)

## 2014-03-13 LAB — BASIC METABOLIC PANEL
Anion gap: 13 (ref 5–15)
BUN: 11 mg/dL (ref 6–23)
CALCIUM: 8.9 mg/dL (ref 8.4–10.5)
CHLORIDE: 101 meq/L (ref 96–112)
CO2: 25 meq/L (ref 19–32)
Creatinine, Ser: 0.59 mg/dL (ref 0.50–1.10)
GFR calc Af Amer: >90 mL/min (ref >90)
GFR calc non Af Amer: >90 mL/min (ref >90)
Glucose, Bld: 107 mg/dL — ABNORMAL HIGH (ref 70–99)
POTASSIUM: 3.8 meq/L (ref 3.7–5.3)
SODIUM: 139 meq/L (ref 137–147)

## 2014-03-13 MED ORDER — IBUPROFEN 800 MG PO TABS: 800.0000 mg | ORAL_TABLET | Freq: Three times a day (TID) | ORAL | Status: DC

## 2014-03-13 MED ORDER — KETOROLAC TROMETHAMINE 60 MG/2ML IM SOLN
60.0000 mg | Freq: Once | INTRAMUSCULAR | Status: AC
  Administered 2014-03-13: 60 mg via INTRAMUSCULAR
  Filled 2014-03-13: qty 2

## 2014-03-13 NOTE — ED Provider Notes (Signed)
CSN: [id number]     Arrival date & time 03/13/14  2016 History   First MD Initiated Contact with Patient 03/13/14 2141     Chief Complaint  Patient presents with  . Chest Pain  . Abdominal Pain     (Consider location/radiation/quality/duration/timing/severity/associated sxs/prior Treatment) Patient is a 53 y.o. female presenting with chest pain.  Chest Pain Pain location:  L chest Pain quality: aching   Pain radiates to:  Does not radiate Pain radiates to the back: no   Pain severity:  Moderate Onset quality:  Gradual Duration:  1 week Timing:  Constant Progression:  Waxing and waning Chronicity:  Recurrent Context: raising an arm   Relieved by:  None tried Worsened by:  Movement Ineffective treatments:  None tried Associated symptoms: no abdominal pain, no cough, no diaphoresis, no fever, no headache, no lower extremity edema, no nausea, no shortness of breath and not vomiting   Risk factors: no coronary artery disease, no diabetes mellitus, no high cholesterol and no hypertension     Past Medical History  Diagnosis Date  . No pertinent past medical history    Past Surgical History  Procedure Laterality Date  . Cesarean section     Family History  Problem Relation Age of Onset  . Other Neg Hx    History  Substance Use Topics  . Smoking status: Never Smoker   . Smokeless tobacco: Not on file  . Alcohol Use: No   OB History   Grav Para Term Preterm Abortions TAB SAB Ect Mult Living   3 3 3  0 0 0 0 0 0 3     Review of Systems  Constitutional: Negative for fever, chills and diaphoresis.  HENT: Negative for congestion and sore throat.   Eyes: Negative for visual disturbance.  Respiratory: Negative for cough, shortness of breath and wheezing.   Cardiovascular: Positive for chest pain. Negative for leg swelling.  Gastrointestinal: Negative for nausea, vomiting, abdominal pain, diarrhea and constipation.  Genitourinary: Negative for dysuria, difficulty urinating  and vaginal pain.  Musculoskeletal: Negative for arthralgias and myalgias.  Skin: Negative for rash.  Neurological: Negative for syncope and headaches.  Psychiatric/Behavioral: Negative for behavioral problems.  All other systems reviewed and are negative.     Allergies  Latex  Home Medications   Prior to Admission medications   Medication Sig Start Date End Date Taking? Authorizing Provider  ibuprofen (ADVIL,MOTRIN) 800 MG tablet Take 1 tablet (800 mg total) by mouth 3 (three) times daily. 03/13/14   Renne Musca, MD   BP 120/66  Pulse 51  Temp(Src) 98 F (36.7 C) (Oral)  Resp 13  SpO2 100%  LMP 02/01/2012 Physical Exam  Vitals reviewed. Constitutional: She is oriented to person, place, and time. She appears well-developed and well-nourished. No distress.  HENT:  Head: Normocephalic and atraumatic.  Eyes: EOM are normal.  Neck: Normal range of motion.  Cardiovascular: Normal rate, regular rhythm and normal heart sounds.   No murmur heard. Pulmonary/Chest: Effort normal and breath sounds normal. No respiratory distress. She has no wheezes. She exhibits tenderness.    Abdominal: Soft. There is no tenderness.  Musculoskeletal: She exhibits no edema.  Neurological: She is alert and oriented to person, place, and time.  Skin: She is not diaphoretic.  Psychiatric: She has a normal mood and affect. Her behavior is normal.    ED Course  Procedures (including critical care time) Labs Review Labs Reviewed  BASIC METABOLIC PANEL - Abnormal; Notable for the following:  Glucose, Bld 107 (*)    All other components within normal limits  CBC  I-STAT TROPOININ, ED    Imaging Review No results found.   EKG Interpretation   Date/Time:  Saturday March 13 2014 20:21:06 EDT Ventricular Rate:  66 PR Interval:  158 QRS Duration: 72 QT Interval:  368 QTC Calculation: 385 R Axis:   83 Text Interpretation:  Normal sinus rhythm Normal ECG No significant change  since  last tracing Confirmed by YAO  MD, DAVID ([id number]) on 03/13/2014  9:56:25 PM      MDM   Final diagnoses:  Chest wall pain    Patient presents with recurrence left-sided chest wall pain. Patient states this pain started one week ago and is worse when lying on her left side with moving her arm. Patient denies any trauma. Patient has had no cough no leg swelling is Wells low risk. Patient states that she has had a recent DVT workup that was negative. Patient's labs as seen above are unremarkable with a negative troponin making ACS unlikely. On physical exam patient has no focal lung sounds and is very tender to the left chest wall and she states it is the pain that she came in for. Patient was given a shot of IM Toradol and had improvement of pain. Is likely costochondritis the patient advised to take Motrin for symptom control. Patient to followup with PCP.    Renne Musca, MD 03/13/14 [phone]

## 2014-03-13 NOTE — ED Notes (Signed)
Patient arrives with complaint of chest pain and mild abdominal pain which began around Monday and has been ongoing. Denies other anginal equivalents. States that otherwise she feels fine. Ambulatory in triage. States that the pain gets worse when she tries to lay down on her side for bed.

## 2014-03-13 NOTE — Discharge Instructions (Signed)

## 2014-03-16 NOTE — ED Provider Notes (Signed)
I saw and evaluated the patient, reviewed the resident's note and I agree with the findings and plan.   EKG Interpretation   Date/Time:  Saturday March 13 2014 20:21:06 EDT Ventricular Rate:  66 PR Interval:  158 QRS Duration: 72 QT Interval:  368 QTC Calculation: 385 R Axis:   83 Text Interpretation:  Normal sinus rhythm Normal ECG No significant change  since last tracing Confirmed by Dhyana Bastone  MD, Kariem Wolfson ([id number]) on 03/13/2014  6:04:54 PM      Samantha Banks is a 53 y.o. female here with L sided chest pain. Started a week ago, worse with movement. No hx of DVT or PE. On exam, patient comfortable. Reproducible tenderness L chest wall. Given toradol, felt better. Labs unremarkable. Likely costochondritis. Will d/c home with motrin.    Wandra Arthurs, MD 03/16/14 2228

## 2014-04-06 ENCOUNTER — Ambulatory Visit: Payer: Self-pay | Admitting: Podiatry

## 2014-04-12 ENCOUNTER — Encounter (HOSPITAL_COMMUNITY): Payer: Self-pay | Admitting: Emergency Medicine

## 2014-04-13 ENCOUNTER — Ambulatory Visit (INDEPENDENT_AMBULATORY_CARE_PROVIDER_SITE_OTHER): Payer: BC Managed Care – PPO

## 2014-04-13 ENCOUNTER — Encounter: Payer: Self-pay | Admitting: Podiatry

## 2014-04-13 ENCOUNTER — Ambulatory Visit (INDEPENDENT_AMBULATORY_CARE_PROVIDER_SITE_OTHER): Payer: BC Managed Care – PPO | Admitting: Podiatry

## 2014-04-13 VITALS — BP 125/84 | HR 74 | Resp 16 | Ht 59.0 in | Wt 149.0 lb

## 2014-04-13 DIAGNOSIS — M779 Enthesopathy, unspecified: Secondary | ICD-10-CM

## 2014-04-13 DIAGNOSIS — M8430XA Stress fracture, unspecified site, initial encounter for fracture: Secondary | ICD-10-CM

## 2014-04-13 MED ORDER — HYDROCODONE-ACETAMINOPHEN 5-325 MG PO TABS: 1.0000 | ORAL_TABLET | ORAL | Status: DC | PRN

## 2014-04-13 NOTE — Progress Notes (Signed)
   Subjective:    Patient ID: Samantha Banks, female    DOB: 01/24/1961, 53 y.o.   MRN: [id number]  HPI Comments: Samantha Banks, 53 year old female, presents the office today with complaints of left foot pain which is been ongoing for approximately 4 weeks and has been progressively getting worse. She states that the left foot is painful and swollen and hurts mostly on top. She states it is painful to walk, stand, wear shoes. She said no prior treatment. She denies any specific injury or trauma to the area. No other complaints at this time.  Foot Pain      Review of Systems  Constitutional:       Sweating  HENT: Positive for sinus pressure.   Eyes: Positive for visual disturbance.  All other systems reviewed and are negative.      Objective:   Physical Exam AAO x3, NAD DP/PT pulses palpable bilaterally, CRT less than 3 seconds Protective sensation intact with Simms Weinstein monofilament, vibratory sensation intact, Achilles tendon reflex intact Tenderness to palpation and pain with vibratory sensation of the left fourth metatarsal. There is overlying edema over this area. No pain with ROM of the MTPJ. There is no other areas of tenderness palpation or pain with vibratory sensation.  MMT decreased on the left secondary to pain. No open lesions. No calf pain, swelling, warmth, erythema.     Assessment & Plan:  53 year old female with left fourth metatarsal stress fracture. -X-rays were obtained and reviewed with the patient. There is periosteal reaction of the fourth metatarsal consistent with stress fracture. -Treatment options discussed including alternatives, risks, complications. Likely etiology discussed.  -At this time recommend immobilization in a CAM Walker. Risks and complications of immobilization including but not limited to DVT/PE were discussed the patient. Discussed with her that if she were to have any symptoms, to go directly to the ER.  -Ice to the effected  area. -Prescribed Vicodin. -Follow-up in 2 weeks for repeat x-ray. Meantime call the office with any questions, concerns, change in symptoms. Follow-up with PCP for other issues mentioned in the ROS as these are chronic and no acute change.

## 2014-04-27 ENCOUNTER — Encounter: Payer: Self-pay | Admitting: Podiatry

## 2014-04-27 ENCOUNTER — Ambulatory Visit (INDEPENDENT_AMBULATORY_CARE_PROVIDER_SITE_OTHER): Payer: BC Managed Care – PPO | Admitting: Podiatry

## 2014-04-27 ENCOUNTER — Ambulatory Visit (INDEPENDENT_AMBULATORY_CARE_PROVIDER_SITE_OTHER): Payer: BC Managed Care – PPO

## 2014-04-27 VITALS — BP 121/74 | HR 61 | Resp 16

## 2014-04-27 DIAGNOSIS — M8430XD Stress fracture, unspecified site, subsequent encounter for fracture with routine healing: Secondary | ICD-10-CM

## 2014-04-27 DIAGNOSIS — M8430XA Stress fracture, unspecified site, initial encounter for fracture: Secondary | ICD-10-CM

## 2014-04-28 NOTE — Progress Notes (Signed)
Patient ID: Samantha Banks, female   DOB: 10/31/1960, 53 y.o.   MRN: [id number]  Subjective: 53 year old female returns the office today for follow up evaluation of left fourth metatarsal stress fracture. She states that she is continuing continuing with the cam walker. 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 She denies any acute changes since last appointment. Denies any systemic complaints as fevers, chills, nausea, vomiting. No other complaints at this time.  Objective: AAO x3, NAD DP/PT pulses palpable bilaterally, CRT less than 3 seconds Protective sensation intact with Simms Weinstein monofilament, vibratory sensation intact, Achilles tendon reflex intact There is tenderness to palpation overlying the left fourth metatarsal. There is mild overlying edema. There is no other areas of pinpoint bony tenderness or pain with vibratory sensation. There is no overlying erythema or increase in warmth. No calf pain with compression, swelling, warmth, erythema. No open lesions or pre-ulcerative lesions.  Assessment: 53 year old female with left fourth metatarsal stress fracture.  Plan: -X-rays were obtained and reviewed with the patient. -Treatment options discussed including alternatives, risks, complications. -At this time recommend continue immobilization in a Cam Walker. Due to patient's job recommend the patient to try to have a more sedentary position at work. A letter was provided for the patient to give to the office. Also discussed possible crutches and/or scooter to help take some of the pressure off however she states that she cannot use this. Discussed the patient the more she is off of her foot it'll help the healing process. -Continue ice to the area. -Follow-up in 3 weeks. In the meantime, call the office in the questions, concerns, change in symptoms.

## 2014-05-20 ENCOUNTER — Encounter: Payer: Self-pay | Admitting: Podiatry

## 2014-05-20 ENCOUNTER — Ambulatory Visit (INDEPENDENT_AMBULATORY_CARE_PROVIDER_SITE_OTHER): Payer: BC Managed Care – PPO | Admitting: Podiatry

## 2014-05-20 ENCOUNTER — Ambulatory Visit (INDEPENDENT_AMBULATORY_CARE_PROVIDER_SITE_OTHER): Payer: BC Managed Care – PPO

## 2014-05-20 VITALS — BP 119/71 | HR 78 | Resp 16

## 2014-05-20 DIAGNOSIS — M8430XD Stress fracture, unspecified site, subsequent encounter for fracture with routine healing: Secondary | ICD-10-CM

## 2014-05-20 NOTE — Progress Notes (Signed)
Patient ID: Samantha Banks, female   DOB: June 08, 1961, 53 y.o.   MRN: [id number]  Subjective: 53 year old female returns the office they for follow-up evaluation of left fourth metatarsal stress fracture. She's been continuing to wear the CAM walker and she's been using crutches. She states that it aches when it rains and she has some mild discomfort at times. She states that she has been staying off of her foot more. Denies any acute changes since last appointment and no other complaints at this time. Denies any systemic complaints as fevers, chills, nausea, vomiting.  Objective: AAO x3, NAD DP/PT pulses palpable bilaterally, CRT less than 3 seconds Protective sensation intact with Simms Weinstein monofilament, vibratory sensation intact, Achilles tendon reflex intact There is mild tenderness to palpation overlying the fourth metatarsal the site of the fracture. There is no overlying edema, erythema, increase in warmth. There is no other areas of pinpoint bony tenderness or pain with vibratory sensation. No gross deformity to the foot. No open lesions or pre-ulcerative lesions. No pain with calf compression, swelling, warmth, erythema.  Assessment: 53 year old female with left fourth metatarsal stress fracture, healing   Plan: -X-rays were obtained and reviewed with the patient.  -Treatment options were discussed including alternatives, risks, complications. -At this time recommend continue immobilization in a CAM Walker. Discussed the patient she can start to put weight in the boot as tolerated however she should wear the boot at all times. Continue to ice and elevate. Again discussed the patient risks, complications of immobilization including but not limited to DVT/PE and directed to the emergency room should any occur. -Follow-up in 2 weeks for repeat x-rays or sooner if any palms are to arise. In the meantime, call the office with any questions, concerns, change in symptoms.

## 2014-06-01 ENCOUNTER — Encounter: Payer: Self-pay | Admitting: Podiatry

## 2014-06-01 ENCOUNTER — Ambulatory Visit (INDEPENDENT_AMBULATORY_CARE_PROVIDER_SITE_OTHER): Payer: BC Managed Care – PPO | Admitting: Podiatry

## 2014-06-01 ENCOUNTER — Ambulatory Visit (INDEPENDENT_AMBULATORY_CARE_PROVIDER_SITE_OTHER): Payer: BC Managed Care – PPO

## 2014-06-01 VITALS — BP 119/75 | HR 74 | Resp 18

## 2014-06-01 DIAGNOSIS — M8430XD Stress fracture, unspecified site, subsequent encounter for fracture with routine healing: Secondary | ICD-10-CM

## 2014-06-06 NOTE — Progress Notes (Signed)
Patient ID: Samantha Banks, female   DOB: 1960-09-02, 53 y.o.   MRN: [id number]  Subjective: 53 year old female returns the office they for follow-up evaluation of left fourth metatarsal stress fracture. His last appointment, she has transition to weightbearing in a Cam Walker. Today, she states that she has no discomfort overlying the area and only has some intermittent discomfort. No recent injury or trauma. Denies any acute changes since last appointment and no other complaints at this time. Denies any systemic complaints as fevers, chills, nausea, vomiting.  Objective: AAO 3, NAD DP/PT pulses palpable bilaterally, CRT less than 3 seconds Protective sensation intact with Simms Weinstein monofilament, vibratory sensation intact, Achilles tendon reflex intact. Currently, there is no tenderness to palpation around the fourth metatarsal is at the fracture. There is no overlying edema, erythema or increase in warmth. There is no other areas of pinpoint bony tenderness or pain with vibratory sensation. No tenderness overlying the right foot/ankle. No open lesions or pre-ulcerative lesions.  No pain with calf compression, swelling, warmth, erythema.  Assessment: 53 year old female with left fourth metatarsal stress fracture, healing  Plan: -X-rays were obtained and reviewed with the patient. -Treatment options were discussed the patient including alternatives, risks, complications. -Discussed the patient over the next couple weeks can slowly start to transition out of the Cam Walker back into a regular supportive sneaker. Discussed the patient that if she has any increase in symptoms will transitioning to return to the [facility]. -Follow-up in 3 weeks or sooner should any problems arise. In the meantime, call the office in the questions, concerns, change in symptoms. We'll obtain a new x-ray next appointment.

## 2014-06-19 ENCOUNTER — Emergency Department (HOSPITAL_COMMUNITY): Payer: BLUE CROSS/BLUE SHIELD

## 2014-06-19 ENCOUNTER — Encounter (HOSPITAL_COMMUNITY): Payer: Self-pay | Admitting: Emergency Medicine

## 2014-06-19 ENCOUNTER — Emergency Department (HOSPITAL_COMMUNITY)
Admission: EM | Admit: 2014-06-19 | Discharge: 2014-06-19 | Disposition: A | Discharge status: Home / Self Care | Payer: BLUE CROSS/BLUE SHIELD | Attending: Emergency Medicine | Admitting: Emergency Medicine

## 2014-06-19 DIAGNOSIS — R05 Cough: Secondary | ICD-10-CM

## 2014-06-19 DIAGNOSIS — J069 Acute upper respiratory infection, unspecified: Secondary | ICD-10-CM | POA: Diagnosis not present

## 2014-06-19 DIAGNOSIS — R0789 Other chest pain: Secondary | ICD-10-CM | POA: Insufficient documentation

## 2014-06-19 DIAGNOSIS — R059 Cough, unspecified: Secondary | ICD-10-CM

## 2014-06-19 DIAGNOSIS — R079 Chest pain, unspecified: Secondary | ICD-10-CM | POA: Diagnosis present

## 2014-06-19 DIAGNOSIS — Z91040 Latex allergy status: Secondary | ICD-10-CM | POA: Diagnosis not present

## 2014-06-19 LAB — CBC
HCT: 42.8 % (ref 36.0–46.0)
Hemoglobin: 14.3 g/dL (ref 12.0–15.0)
MCH: 31 pg (ref 26.0–34.0)
MCHC: 33.4 g/dL (ref 30.0–36.0)
MCV: 92.8 fL (ref 78.0–100.0)
Platelets: 221 10*3/uL (ref 150–400)
RBC: 4.61 MIL/uL (ref 3.87–5.11)
RDW: 13.1 % (ref 11.5–15.5)
WBC: 12.1 10*3/uL — AB (ref 4.0–10.5)

## 2014-06-19 LAB — BASIC METABOLIC PANEL
ANION GAP: 9 (ref 5–15)
BUN: 12 mg/dL (ref 6–23)
CALCIUM: 9.1 mg/dL (ref 8.4–10.5)
CO2: 26 mmol/L (ref 19–32)
Chloride: 104 mEq/L (ref 96–112)
Creatinine, Ser: 0.79 mg/dL (ref 0.50–1.10)
GFR calc Af Amer: >90 mL/min (ref >90)
GLUCOSE: 104 mg/dL — AB (ref 70–99)
Potassium: 3.9 mmol/L (ref 3.5–5.1)
Sodium: 139 mmol/L (ref 135–145)

## 2014-06-19 LAB — I-STAT TROPONIN, ED: Troponin i, poc: 0 ng/mL (ref 0.00–0.08)

## 2014-06-19 MED ORDER — GUAIFENESIN-CODEINE 100-10 MG/5ML PO SOLN: 5.0000 mL | Freq: Three times a day (TID) | ORAL | Status: DC | PRN

## 2014-06-19 MED ORDER — HYDROCODONE-ACETAMINOPHEN 5-325 MG PO TABS
2.0000 | ORAL_TABLET | Freq: Once | ORAL | Status: AC
  Administered 2014-06-19: 2 via ORAL
  Filled 2014-06-19: qty 2

## 2014-06-19 MED ORDER — IBUPROFEN 400 MG PO TABS: 400.0000 mg | ORAL_TABLET | Freq: Four times a day (QID) | ORAL | Status: DC | PRN

## 2014-06-19 MED ORDER — IBUPROFEN 800 MG PO TABS
800.0000 mg | ORAL_TABLET | Freq: Once | ORAL | Status: AC
  Administered 2014-06-19: 800 mg via ORAL
  Filled 2014-06-19: qty 1

## 2014-06-19 NOTE — Discharge Instructions (Signed)

## 2014-06-19 NOTE — ED Notes (Signed)
EKG given to EDP Vanita Panda for review

## 2014-06-19 NOTE — ED Provider Notes (Signed)
CSN: [id number]     Arrival date & time 06/19/14  1957 History   First MD Initiated Contact with Patient 06/19/14 2158     Chief Complaint  Patient presents with  . Chest Pain  . Cough     (Consider location/radiation/quality/duration/timing/severity/associated sxs/prior Treatment) HPI Comments: Patient presents today with a chief complaint of cough.  She states that she has had a productive cough since yesterday.  She also reports that she has had some substernal non radiating chest pain.  She describes the pain as soreness.  Pain has been constant since yesterday morning.  She states that the pain is worse with coughing. She has taken [place] for her symptoms with mild improvement.  She denies fever, chills, hemoptysis, nausea, vomiting, abdominal pain, diaphoresis, LE pain, or LE swelling.  She denies prior cardiac history.  Denies history of HTN, Hyperlipidemia, or DM.  She does not smoke.  No family history of Cardiac disease.  She denies history of PE or DVT.  Denies prolonged travel or surgeries in the past 4 weeks.  Denies use of estrogen containing medications.  Patient is a 54 y.o. female presenting with chest pain and cough. The history is provided by the patient.  Chest Pain Associated symptoms: cough   Cough Associated symptoms: chest pain     Past Medical History  Diagnosis Date  . No pertinent past medical history    Past Surgical History  Procedure Laterality Date  . Cesarean section     Family History  Problem Relation Age of Onset  . Other Neg Hx    History  Substance Use Topics  . Smoking status: Never Smoker   . Smokeless tobacco: Not on file  . Alcohol Use: No   OB History    Gravida Para Term Preterm AB TAB SAB Ectopic Multiple Living   3 3 3  0 0 0 0 0 0 3     Review of Systems  Respiratory: Positive for cough.   Cardiovascular: Positive for chest pain.  All other systems reviewed and are negative.     Allergies  Latex  Home Medications    Prior to Admission medications   Medication Sig Start Date End Date Taking? Authorizing Provider  HYDROcodone-acetaminophen (NORCO/VICODIN) 5-325 MG per tablet Take 1 tablet by mouth every 4 (four) hours as needed. Patient not taking: Reported on 06/19/2014 04/13/14   Celesta Gentile, DPM  ibuprofen (ADVIL,MOTRIN) 800 MG tablet Take 1 tablet (800 mg total) by mouth 3 (three) times daily. Patient not taking: Reported on 06/19/2014 03/13/14   Renne Musca, MD   BP 126/71 mmHg  Pulse 84  Temp(Src) 99.1 F (37.3 C) (Oral)  Resp 16  Ht 4\' 11"  (1.499 m)  Wt 140 lb (63.504 kg)  BMI 28.26 kg/m2  SpO2 97%  LMP 02/01/2012 Physical Exam  Constitutional: She appears well-developed and well-nourished.  HENT:  Head: Normocephalic and atraumatic.  Mouth/Throat: Oropharynx is clear and moist.  Neck: Normal range of motion. Neck supple.  Cardiovascular: Normal rate, regular rhythm and normal heart sounds.   Pulses:      Dorsalis pedis pulses are 2+ on the right side, and 2+ on the left side.  Pulmonary/Chest: Effort normal and breath sounds normal. No respiratory distress. She has no wheezes. She has no rales. She exhibits tenderness.  Abdominal: Soft. Bowel sounds are normal. She exhibits no distension and no mass. There is no tenderness. There is no rebound and no guarding.  Musculoskeletal: Normal range of motion.  No LE edema bilaterally  Neurological: She is alert.  Skin: Skin is warm and dry.  Psychiatric: She has a normal mood and affect.  Nursing note and vitals reviewed.   ED Course  Procedures (including critical care time) Labs Review Labs Reviewed  CBC - Abnormal; Notable for the following:    WBC 12.1 (*)    All other components within normal limits  BASIC METABOLIC PANEL - Abnormal; Notable for the following:    Glucose, Bld 104 (*)    All other components within normal limits  I-STAT TROPOININ, ED    Imaging Review Dg Chest 2 View  06/19/2014   CLINICAL DATA:  Initial  evaluation for chest pain cough congestion 1 week, worse for the past day  EXAM: CHEST  2 VIEW  COMPARISON:  10/30/2007  FINDINGS: The heart size and mediastinal contours are within normal limits. Both lungs are clear. The visualized skeletal structures are unremarkable.  IMPRESSION: No active cardiopulmonary disease.   Electronically Signed   By: Skipper Cliche M.D.   On: 06/19/2014 21:14     EKG Interpretation None      Date: 06/19/2014  Rate: 97  Rhythm: normal sinus rhythm  QRS Axis: normal  Intervals: normal  ST/T Wave abnormalities: normal  Conduction Disutrbances:none  Narrative Interpretation:   Old EKG Reviewed: unchanged  11:01 PM Reassessed patient.  She reports that her pain has improved at this time. MDM   Final diagnoses:  Chest pain  Cough   Patient presents today with cough and chest wall pain.  She reports that her chest pain has been constant since yesterday morning.  Pain is atypical and worse with coughing and palpation.  Chest wall tender to palpation on exam.  Feel that the pain is most likely musculoskeletal, possibly costochondritis.  No ischemic changes on EKG.  Troponin negative.  CXR negative.  No risk factors for PE aside from age.  HEART score is 1.  Pain improved after given Ibuprofen and Hydrocodone.  Feel that the patient is stable for discharge.  Return precautions given.  Patient discussed with Dr Vanita Panda who is in agreement with the plan.     Hyman Bible, [name] 06/19/14 [place], MD 06/20/14 1660  Carmin Muskrat, MD 06/20/14 [phone]

## 2014-06-19 NOTE — ED Notes (Signed)
Pt from home c/o generalized chest pain that's is sensitive to touch and cough, and chills x several days.

## 2014-06-22 ENCOUNTER — Ambulatory Visit: Payer: BC Managed Care – PPO | Admitting: Podiatry

## 2014-06-29 ENCOUNTER — Ambulatory Visit (INDEPENDENT_AMBULATORY_CARE_PROVIDER_SITE_OTHER): Payer: BLUE CROSS/BLUE SHIELD

## 2014-06-29 ENCOUNTER — Ambulatory Visit (INDEPENDENT_AMBULATORY_CARE_PROVIDER_SITE_OTHER): Payer: BLUE CROSS/BLUE SHIELD | Admitting: Podiatry

## 2014-06-29 VITALS — BP 122/70 | HR 63 | Resp 16

## 2014-06-29 DIAGNOSIS — M8430XD Stress fracture, unspecified site, subsequent encounter for fracture with routine healing: Secondary | ICD-10-CM

## 2014-06-29 MED ORDER — ACETAMINOPHEN-CODEINE #3 300-30 MG PO TABS: 1.0000 | ORAL_TABLET | Freq: Four times a day (QID) | ORAL | Status: DC | PRN

## 2014-06-30 NOTE — Progress Notes (Signed)
Patient ID: Samantha Banks, female   DOB: 03/03/1961, 54 y.o.   MRN: [id number]  Subjective: 54 year old female returns the office today for follow-up evaluation of left fourth metatarsal stress fracture. Since last appointment she states that she is transition back into regular sneaker. She states that she intermittently has some achiness to the foot is requesting something to help with the discomfort however she denies any specific pain. She is also return back to work. No other complaints at this time.  Objective: AAO x3, NAD; wearing a regular shoe (boot) DP/PT pulses palpable bilaterally, CRT less than 3 seconds Protective sensation intact with Simms Weinstein monofilament, vibratory sensation intact, Achilles tendon reflex intact There is no areas of pinpoint bony tenderness or pain the vibratory sensation overlying the site of the fracture or other areas of the left foot. There is no tenderness over the right foot. There is trace edema to the left foot. There is no overlying erythema or increase in warmth. MMT 5/5, ROM WNL No open lesions or pre-ulcerative lesions. No pain with calf compression, swelling, warmth, erythema.  Assessment: 54 year old female presents for follow-up of left fourth metatarsal stress fracture  Plan: -X-ray was obtained and reviewed the patient. -Treatment options were discussed including alternatives, risks, complications. -Discussed the patient that she would most likely be best any regular, supportive sneaker as opposed to a boot type shoe. -Given a one-time prescription of Tylenol 3. -Discussed with the patient that if she has any recurrence of symptoms or any increase in symptoms to call the office. Otherwise, follow-up as needed. Encouraged to call the office with any questions, concerns, changes symptoms.

## 2014-07-09 ENCOUNTER — Emergency Department (HOSPITAL_COMMUNITY): Payer: BLUE CROSS/BLUE SHIELD

## 2014-07-09 ENCOUNTER — Emergency Department (HOSPITAL_COMMUNITY)
Admission: EM | Admit: 2014-07-09 | Discharge: 2014-07-09 | Disposition: A | Discharge status: Home / Self Care | Payer: BLUE CROSS/BLUE SHIELD | Attending: Emergency Medicine | Admitting: Emergency Medicine

## 2014-07-09 ENCOUNTER — Encounter (HOSPITAL_COMMUNITY): Payer: Self-pay | Admitting: *Deleted

## 2014-07-09 DIAGNOSIS — R071 Chest pain on breathing: Secondary | ICD-10-CM | POA: Diagnosis not present

## 2014-07-09 DIAGNOSIS — R079 Chest pain, unspecified: Secondary | ICD-10-CM | POA: Diagnosis present

## 2014-07-09 DIAGNOSIS — Z91040 Latex allergy status: Secondary | ICD-10-CM | POA: Diagnosis not present

## 2014-07-09 DIAGNOSIS — R110 Nausea: Secondary | ICD-10-CM | POA: Diagnosis not present

## 2014-07-09 LAB — I-STAT TROPONIN, ED: TROPONIN I, POC: 0 ng/mL (ref 0.00–0.08)

## 2014-07-09 LAB — CBC
HCT: 42 % (ref 36.0–46.0)
HEMOGLOBIN: 13.3 g/dL (ref 12.0–15.0)
MCH: 29.8 pg (ref 26.0–34.0)
MCHC: 31.7 g/dL (ref 30.0–36.0)
MCV: 94.2 fL (ref 78.0–100.0)
PLATELETS: 217 10*3/uL (ref 150–400)
RBC: 4.46 MIL/uL (ref 3.87–5.11)
RDW: 13.4 % (ref 11.5–15.5)
WBC: 9 10*3/uL (ref 4.0–10.5)

## 2014-07-09 LAB — BASIC METABOLIC PANEL
Anion gap: 5 (ref 5–15)
BUN: 15 mg/dL (ref 6–23)
CHLORIDE: 109 mmol/L (ref 96–112)
CO2: 28 mmol/L (ref 19–32)
Calcium: 8.7 mg/dL (ref 8.4–10.5)
Creatinine, Ser: 0.6 mg/dL (ref 0.50–1.10)
GFR calc Af Amer: >90 mL/min (ref >90)
GFR calc non Af Amer: >90 mL/min (ref >90)
GLUCOSE: 72 mg/dL (ref 70–99)
Potassium: 3.8 mmol/L (ref 3.5–5.1)
SODIUM: 142 mmol/L (ref 135–145)

## 2014-07-09 MED ORDER — HYDROCODONE-ACETAMINOPHEN 5-325 MG PO TABS
1.0000 | ORAL_TABLET | ORAL | Status: AC
  Administered 2014-07-09: 1 via ORAL
  Filled 2014-07-09: qty 1

## 2014-07-09 MED ORDER — HYDROCODONE-ACETAMINOPHEN 5-325 MG PO TABS: 1.0000 | ORAL_TABLET | ORAL | Status: DC | PRN

## 2014-07-09 NOTE — ED Notes (Addendum)
Pt aware of driving risks with vidcodin and reports will have daughter pick her up.

## 2014-07-09 NOTE — Discharge Instructions (Signed)
Chest Wall Pain °Chest wall pain is pain felt in or around the chest bones and muscles. It may take up to 6 weeks to get better. It may take longer if you are active. Chest wall pain can happen on its own. Other times, things like germs, injury, coughing, or exercise can cause the pain. °HOME CARE  °· Avoid activities that make you tired or cause pain. Try not to use your chest, belly (abdominal), or side muscles. Do not use heavy weights. °· Put ice on the sore area. °¨ Put ice in a plastic bag. °¨ Place a towel between your skin and the bag. °¨ Leave the ice on for 15-20 minutes for the first 2 days. °· Only take medicine as told by your doctor. °GET HELP RIGHT AWAY IF:  °· You have more pain or are very uncomfortable. °· You have a fever. °· Your chest pain gets worse. °· You have new problems. °· You feel sick to your stomach (nauseous) or throw up (vomit). °· You start to sweat or feel lightheaded. °· You have a cough with mucus (phlegm). °· You cough up blood. °MAKE SURE YOU:  °· Understand these instructions. °· Will watch your condition. °· Will get help right away if you are not doing well or get worse. °Document Released: 11/14/2007 Document Revised: 08/20/2011 Document Reviewed: 01/22/2011 °[employer]® Patient Information ©2015 [employer]. This information is not intended to replace advice given to you by your health care provider. Make sure you discuss any questions you have with your health care provider. ° °

## 2014-07-09 NOTE — ED Notes (Signed)
Pt checked in as L breast pain. In triage, pt sts pain is under L breast and radiating to L chest. Intermittent pain for last week, worse since last night. Worse when lying flat or takin a deep breath. Mild nausea. Denies vomiting, sob.

## 2014-07-09 NOTE — ED Provider Notes (Signed)
CSN: [id number]     Arrival date & time 07/09/14  1621 History   First MD Initiated Contact with Patient 07/09/14 1841     Chief Complaint  Patient presents with  . Chest Pain  . Nausea    Patient is a 54 y.o. female presenting with chest pain. The history is provided by the patient.  Chest Pain Pain location:  L chest Pain quality: aching   Pain quality comment:  A deep pain Pain radiates to:  Does not radiate Pain severity:  Severe Duration:  1 week Timing:  Constant (it has been constant all day today) Chronicity:  New Context: breathing   Context comment:  Coughing Relieved by:  Rest Worsened by:  Coughing and deep breathing Risk factors: no coronary artery disease and no prior DVT/PE     Past Medical History  Diagnosis Date  . No pertinent past medical history    Past Surgical History  Procedure Laterality Date  . Cesarean section     Family History  Problem Relation Age of Onset  . Other Neg Hx    History  Substance Use Topics  . Smoking status: Never Smoker   . Smokeless tobacco: Not on file  . Alcohol Use: No   OB History    Gravida Para Term Preterm AB TAB SAB Ectopic Multiple Living   3 3 3  0 0 0 0 0 0 3     Review of Systems  Cardiovascular: Positive for chest pain.  All other systems reviewed and are negative.     Allergies  Latex  Home Medications   Prior to Admission medications   Medication Sig Start Date End Date Taking? Authorizing Provider  acetaminophen-codeine (TYLENOL #3) 300-30 MG per tablet Take 1 tablet by mouth every 6 (six) hours as needed for moderate pain. 06/29/14  Yes Celesta Gentile, DPM  ibuprofen (ADVIL,MOTRIN) 400 MG tablet Take 1 tablet (400 mg total) by mouth every 6 (six) hours as needed. 06/19/14  Yes Heather Laisure, [name]  HYDROcodone-acetaminophen (NORCO/VICODIN) 5-325 MG per tablet Take 1-2 tablets by mouth every 4 (four) hours as needed. 07/09/14   Dorie Rank, MD   BP 107/57 mmHg  Pulse 62  Temp(Src) 97.9 F  (36.6 C) (Oral)  Resp 16  SpO2 96%  LMP 02/01/2012 Physical Exam  Constitutional: She appears well-developed and well-nourished. No distress.  HENT:  Head: Normocephalic and atraumatic.  Right Ear: External ear normal.  Left Ear: External ear normal.  Eyes: Conjunctivae are normal. Right eye exhibits no discharge. Left eye exhibits no discharge. No scleral icterus.  Neck: Neck supple. No tracheal deviation present.  Cardiovascular: Normal rate, regular rhythm and intact distal pulses.   Pulmonary/Chest: Effort normal and breath sounds normal. No stridor. No respiratory distress. She has no wheezes. She has no rales. She exhibits tenderness (left side).  Abdominal: Soft. Bowel sounds are normal. She exhibits no distension. There is no tenderness. There is no rebound and no guarding.  Musculoskeletal: She exhibits no edema or tenderness.  Neurological: She is alert. She has normal strength. No cranial nerve deficit (no facial droop, extraocular movements intact, no slurred speech) or sensory deficit. She exhibits normal muscle tone. She displays no seizure activity. Coordination normal.  Skin: Skin is warm and dry. No rash noted.  Psychiatric: She has a normal mood and affect.  Nursing note and vitals reviewed.   ED Course  Procedures (including critical care time) Labs Review Labs Reviewed  CBC  BASIC METABOLIC PANEL  I-STAT  [place], ED  [facility], ED    Imaging Review Dg Chest 2 View  07/09/2014   CLINICAL DATA:  Left-sided chest pain for 1 week, worsening.  EXAM: CHEST  2 VIEW  COMPARISON:  06/19/2014  FINDINGS: The heart size and mediastinal contours are within normal limits. Both lungs are clear. The visualized skeletal structures are unremarkable.  There is no significant interval change.  IMPRESSION: No active cardiopulmonary disease.   Electronically Signed   By: Andreas Newport M.D.   On: 07/09/2014 17:48     Date: 07/09/2014  Rate: 72  Rhythm: normal sinus  rhythm  QRS Axis: normal  Intervals: normal  ST/T Wave abnormalities: normal  Conduction Disutrbances:none  Narrative Interpretation: nl  Old EKG Reviewed: none available    MDM   Final diagnoses:  Chest pain on breathing    Pt has chest wall ttp.  Sx ongoing for 1 week.  Did have a recent URI and has been coughing a lot.  Doubt ACS, PE.  DC home with pain meds.  Follow up with pCP    Dorie Rank, MD 07/10/14 [phone]

## 2014-08-06 ENCOUNTER — Other Ambulatory Visit: Payer: Self-pay | Admitting: Orthopedic Surgery

## 2014-08-06 ENCOUNTER — Ambulatory Visit
Admission: RE | Admit: 2014-08-06 | Discharge: 2014-08-06 | Disposition: A | Discharge status: Home / Self Care | Payer: BLUE CROSS/BLUE SHIELD | Source: Ambulatory Visit | Attending: Orthopedic Surgery | Admitting: Orthopedic Surgery

## 2014-08-06 DIAGNOSIS — M940 Chondrocostal junction syndrome [Tietze]: Secondary | ICD-10-CM

## 2014-08-23 ENCOUNTER — Emergency Department (HOSPITAL_COMMUNITY)
Admission: EM | Admit: 2014-08-23 | Discharge: 2014-08-24 | Disposition: A | Discharge status: Home / Self Care | Payer: BLUE CROSS/BLUE SHIELD | Attending: Emergency Medicine | Admitting: Emergency Medicine

## 2014-08-23 ENCOUNTER — Encounter (HOSPITAL_COMMUNITY): Payer: Self-pay | Admitting: Emergency Medicine

## 2014-08-23 ENCOUNTER — Emergency Department (HOSPITAL_COMMUNITY): Payer: BLUE CROSS/BLUE SHIELD

## 2014-08-23 DIAGNOSIS — B9789 Other viral agents as the cause of diseases classified elsewhere: Secondary | ICD-10-CM

## 2014-08-23 DIAGNOSIS — R079 Chest pain, unspecified: Secondary | ICD-10-CM | POA: Insufficient documentation

## 2014-08-23 DIAGNOSIS — J069 Acute upper respiratory infection, unspecified: Secondary | ICD-10-CM

## 2014-08-23 DIAGNOSIS — Z91040 Latex allergy status: Secondary | ICD-10-CM | POA: Diagnosis not present

## 2014-08-23 DIAGNOSIS — R05 Cough: Secondary | ICD-10-CM | POA: Diagnosis present

## 2014-08-23 LAB — COMPREHENSIVE METABOLIC PANEL
ALK PHOS: 69 U/L (ref 39–117)
ALT: 18 U/L (ref 0–35)
AST: 18 U/L (ref 0–37)
Albumin: 4.6 g/dL (ref 3.5–5.2)
Anion gap: 9 (ref 5–15)
BUN: 11 mg/dL (ref 6–23)
CO2: 26 mmol/L (ref 19–32)
Calcium: 9.4 mg/dL (ref 8.4–10.5)
Chloride: 104 mmol/L (ref 96–112)
Creatinine, Ser: 0.61 mg/dL (ref 0.50–1.10)
GFR calc Af Amer: >90 mL/min (ref >90)
GLUCOSE: 104 mg/dL — AB (ref 70–99)
POTASSIUM: 3.7 mmol/L (ref 3.5–5.1)
SODIUM: 139 mmol/L (ref 135–145)
Total Bilirubin: 0.5 mg/dL (ref 0.3–1.2)
Total Protein: 7.8 g/dL (ref 6.0–8.3)

## 2014-08-23 LAB — CBC WITH DIFFERENTIAL/PLATELET
BASOS ABS: 0 10*3/uL (ref 0.0–0.1)
Basophils Relative: 0 % (ref 0–1)
EOS ABS: 0 10*3/uL (ref 0.0–0.7)
Eosinophils Relative: 0 % (ref 0–5)
HCT: 44.7 % (ref 36.0–46.0)
Hemoglobin: 14.6 g/dL (ref 12.0–15.0)
Lymphocytes Relative: 5 % — ABNORMAL LOW (ref 12–46)
Lymphs Abs: 0.6 10*3/uL — ABNORMAL LOW (ref 0.7–4.0)
MCH: 30.5 pg (ref 26.0–34.0)
MCHC: 32.7 g/dL (ref 30.0–36.0)
MCV: 93.3 fL (ref 78.0–100.0)
Monocytes Absolute: 1.2 10*3/uL — ABNORMAL HIGH (ref 0.1–1.0)
Monocytes Relative: 9 % (ref 3–12)
NEUTROS ABS: 10.6 10*3/uL — AB (ref 1.7–7.7)
Neutrophils Relative %: 86 % — ABNORMAL HIGH (ref 43–77)
Platelets: 205 10*3/uL (ref 150–400)
RBC: 4.79 MIL/uL (ref 3.87–5.11)
RDW: 14.3 % (ref 11.5–15.5)
WBC: 12.4 10*3/uL — AB (ref 4.0–10.5)

## 2014-08-23 LAB — BRAIN NATRIURETIC PEPTIDE: B NATRIURETIC PEPTIDE 5: 15.4 pg/mL (ref 0.0–100.0)

## 2014-08-23 LAB — I-STAT TROPONIN, ED: Troponin i, poc: 0 ng/mL (ref 0.00–0.08)

## 2014-08-23 MED ORDER — ACETAMINOPHEN-CODEINE #3 300-30 MG PO TABS: 1.0000 | ORAL_TABLET | Freq: Two times a day (BID) | ORAL | Status: DC | PRN

## 2014-08-23 MED ORDER — METOCLOPRAMIDE HCL 10 MG PO TABS
10.0000 mg | ORAL_TABLET | Freq: Once | ORAL | Status: AC
  Administered 2014-08-24: 10 mg via ORAL
  Filled 2014-08-23: qty 1

## 2014-08-23 MED ORDER — ACETAMINOPHEN-CODEINE #3 300-30 MG PO TABS
2.0000 | ORAL_TABLET | Freq: Once | ORAL | Status: AC
  Administered 2014-08-24: 2 via ORAL
  Filled 2014-08-23: qty 2

## 2014-08-23 NOTE — ED Provider Notes (Signed)
CSN: [id number]     Arrival date & time 08/23/14  1751 History   First MD Initiated Contact with Patient 08/23/14 2301     Chief Complaint  Patient presents with  . Chest Pain  . Cough     (Consider location/radiation/quality/duration/timing/severity/associated sxs/prior Treatment) HPI  Samantha Banks is a 54 y.o. female with no significant past medical history presenting today with congestion and cough for the past 2 months. Patient states this has occurred off and on and this episode has lasted for the last 2 days. She was treated with azithromycin recently with only minimal relief. Patient describes associated chest pain as well which is worse with a cough. It is midsternal without radiation. The cough is nonproductive, she admits to subjective fevers as well. She has no rhinorrhea or sneezing. She does have sick contacts and her children with viral URIs.  She denies getting her flu shot this year, she does have diffuse body aches as well. Patient states she has no further complaints.  10 Systems reviewed and are negative for acute change except as noted in the HPI.    Past Medical History  Diagnosis Date  . No pertinent past medical history    Past Surgical History  Procedure Laterality Date  . Cesarean section     Family History  Problem Relation Age of Onset  . Other Neg Hx    History  Substance Use Topics  . Smoking status: Never Smoker   . Smokeless tobacco: Not on file  . Alcohol Use: No   OB History    Gravida Para Term Preterm AB TAB SAB Ectopic Multiple Living   3 3 3  0 0 0 0 0 0 3     Review of Systems    Allergies  Latex  Home Medications   Prior to Admission medications   Medication Sig Start Date End Date Taking? Authorizing Provider  acetaminophen-codeine (TYLENOL #3) 300-30 MG per tablet Take 1 tablet by mouth every 6 (six) hours as needed for moderate pain. Patient not taking: Reported on 08/23/2014 06/29/14   Trula Slade, DPM   HYDROcodone-acetaminophen (NORCO/VICODIN) 5-325 MG per tablet Take 1-2 tablets by mouth every 4 (four) hours as needed. Patient not taking: Reported on 08/23/2014 07/09/14   Dorie Rank, MD  ibuprofen (ADVIL,MOTRIN) 400 MG tablet Take 1 tablet (400 mg total) by mouth every 6 (six) hours as needed. Patient not taking: Reported on 08/23/2014 06/19/14   Hyman Bible, [name]   BP 111/65 mmHg  Pulse 93  Temp(Src) 98.9 F (37.2 C) (Oral)  Resp 20  SpO2 100%  LMP 02/01/2012 Physical Exam  Constitutional: She is oriented to person, place, and time. She appears well-developed and well-nourished. No distress.  HENT:  Head: Normocephalic and atraumatic.  Nose: Nose normal.  Mouth/Throat: Oropharynx is clear and moist. No oropharyngeal exudate.  Eyes: Conjunctivae and EOM are normal. Pupils are equal, round, and reactive to light. No scleral icterus.  Neck: Normal range of motion. Neck supple. No JVD present. No tracheal deviation present. No thyromegaly present.  Cardiovascular: Normal rate, regular rhythm and normal heart sounds.  Exam reveals no gallop and no friction rub.   No murmur heard. Pulmonary/Chest: Effort normal and breath sounds normal. No respiratory distress. She has no wheezes. She exhibits no tenderness.  Abdominal: Soft. Bowel sounds are normal. She exhibits no distension and no mass. There is no tenderness. There is no rebound and no guarding.  Musculoskeletal: Normal range of motion. She exhibits no  edema or tenderness.  Lymphadenopathy:    She has no cervical adenopathy.  Neurological: She is alert and oriented to person, place, and time. No cranial nerve deficit. She exhibits normal muscle tone.  Skin: Skin is warm and dry. No rash noted. No erythema. No pallor.  Nursing note and vitals reviewed.   ED Course  Procedures (including critical care time) Labs Review Labs Reviewed  CBC WITH DIFFERENTIAL/PLATELET - Abnormal; Notable for the following:    WBC 12.4 (*)     Neutrophils Relative % 86 (*)    Neutro Abs 10.6 (*)    Lymphocytes Relative 5 (*)    Lymphs Abs 0.6 (*)    Monocytes Absolute 1.2 (*)    All other components within normal limits  COMPREHENSIVE METABOLIC PANEL - Abnormal; Notable for the following:    Glucose, Bld 104 (*)    All other components within normal limits  BRAIN NATRIURETIC PEPTIDE  I-STAT [facility], ED    Imaging Review Dg Chest 2 View  08/23/2014   CLINICAL DATA:  Acute onset of cough, congestion and shortness of breath. Chest pain. Initial encounter.  EXAM: CHEST  2 VIEW  COMPARISON:  Chest radiograph performed 08/06/2014  FINDINGS: The lungs are well-aerated and clear. There is no evidence of focal opacification, pleural effusion or pneumothorax.  The heart is normal in size; the mediastinal contour is within normal limits. No acute osseous abnormalities are seen.  IMPRESSION: No acute cardiopulmonary process seen.   Electronically Signed   By: Garald Balding M.D.   On: 08/23/2014 19:21     EKG Interpretation   Date/Time:  Monday August 23 2014 17:59:18 EDT Ventricular Rate:  95 PR Interval:  141 QRS Duration: 74 QT Interval:  330 QTC Calculation: 415 R Axis:   57 Text Interpretation:  Sinus rhythm Probable left atrial enlargement Normal  ECG Since last tracing rate faster Confirmed by MILLER  MD, BRIAN ([id number])  on 08/23/2014 9:14:29 PM      MDM   Final diagnoses:  Chest pain    Patient since emergency department for congestion and coughing for the past 2 months. Chest x-ray here does not show any concerns for pneumonia. White count is mildly elevated at 12.4, I do not believe this is reflective of any serious bacterial infection. She likely has a viral URI with persistent cough. Also has sick contacts at home. Patient was given Tylenol 3 and Reglan emergency department to treat her coughing, chest pain and headache.  Patient will be given a prescription for continued relief. Her vital signs were within her  normal limits and she is safe for discharge and primary care follow-up within 3 days.    Everlene Balls, MD 08/23/14 2324

## 2014-08-23 NOTE — ED Notes (Signed)
Pt states she was here one month ago for similar symtoms, states she received pills that made the pain go away. States she's having continued pain that's not going away. Pt states today she's been having fever/chills, chest pain, and a dry painful cough

## 2014-08-23 NOTE — Discharge Instructions (Signed)
Cough, Adult [name], your chest x-ray does not show any pneumonia. You likely have a viral infection causing her cough. Continue to take medication as prescribed to help your symptoms and this will resolve on its own. Follow-up with your primary care physician within 3 days for continued management. If symptoms worsen come back to the emergency department immediately. Thank you.  A cough is a reflex. It helps you clear your throat and airways. A cough can help heal your body. A cough can last 2 or 3 weeks (acute) or may last more than 8 weeks (chronic). Some common causes of a cough can include an infection, allergy, or a cold. HOME CARE  Only take medicine as told by your doctor.  If given, take your medicines (antibiotics) as told. Finish them even if you start to feel better.  Use a cold steam vaporizer or humidifier in your home. This can help loosen thick spit (secretions).  Sleep so you are almost sitting up (semi-upright). Use pillows to do this. This helps reduce coughing.  Rest as needed.  Stop smoking if you smoke. GET HELP RIGHT AWAY IF:  You have yellowish-white fluid (pus) in your thick spit.  Your cough gets worse.  Your medicine does not reduce coughing, and you are losing sleep.  You cough up blood.  You have trouble breathing.  Your pain gets worse and medicine does not help.  You have a fever. MAKE SURE YOU:   Understand these instructions.  Will watch your condition.  Will get help right away if you are not doing well or get worse. Document Released: 02/08/2011 Document Revised: 10/12/2013 Document Reviewed: 02/08/2011 [facility] Patient Information 2015 [place], [place]. This information is not intended to replace advice given to you by your health care provider. Make sure you discuss any questions you have with your health care provider.

## 2014-08-24 NOTE — ED Notes (Signed)
Awake. Verbally responsive. A/O x4. Resp even and unlabored. No audible adventitious breath sounds noted. ABC's intact.  

## 2014-09-20 ENCOUNTER — Encounter: Payer: Self-pay | Admitting: Pulmonary Disease

## 2014-09-20 ENCOUNTER — Ambulatory Visit (INDEPENDENT_AMBULATORY_CARE_PROVIDER_SITE_OTHER): Payer: BLUE CROSS/BLUE SHIELD | Admitting: Pulmonary Disease

## 2014-09-20 VITALS — BP 118/70 | HR 64 | Temp 97.6°F | Ht 59.0 in | Wt 144.0 lb

## 2014-09-20 DIAGNOSIS — R0600 Dyspnea, unspecified: Secondary | ICD-10-CM | POA: Diagnosis not present

## 2014-09-20 DIAGNOSIS — F411 Generalized anxiety disorder: Secondary | ICD-10-CM | POA: Diagnosis not present

## 2014-09-20 DIAGNOSIS — R0789 Other chest pain: Secondary | ICD-10-CM | POA: Diagnosis not present

## 2014-09-20 HISTORY — DX: Dyspnea, unspecified: R06.00

## 2014-09-20 MED ORDER — TRAMADOL HCL 50 MG PO TABS: 50.0000 mg | ORAL_TABLET | Freq: Three times a day (TID) | ORAL | Status: DC | PRN

## 2014-09-20 MED ORDER — CLONAZEPAM 0.5 MG PO TABS: ORAL_TABLET | ORAL | Status: DC

## 2014-09-20 NOTE — Patient Instructions (Signed)
For your chest wall pain/ inflammation>>    I recommend that you REST THE CHEST (no heavy lifting etc)...    Apply HEAT to the chest wall- hot towel, heating pad, consider deep heat cream/ solanpas patches/ etc (all avail OTC)    Take the TRAMADOL 50mg  - one tab up to 3 times daily as needed for pain (you may take it w/ one extra strength Tylenol to potentiate it's effect...  For the chest wall muscle spasm>>    Apply the HEAT as directed...    Use the "combination relaxer" as directed>       In this regard try KLONOPIN Rx 0.5mg  tabs- 1/2 to 1 tab twice daily to help the shortness of breath & allow you to take in a good deep breath...  You should follow up w/ your primary provider in 3-4 weeks.Marland KitchenMarland Kitchen

## 2014-09-20 NOTE — Progress Notes (Signed)
Subjective:     Patient ID: Samantha Banks, female   DOB: 09-09-1960, 54 y.o.   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   She states she noted onset of chest congestion ~38mo ago, with lingering symptoms but her CC is the CWP ("it was so painful- pain, pain, pain"); also notes SOB- "I have to take deep breaths all day long", hard to get the air "in", doesn't feel satisfied breathing but if she takes a deep sigh breath she'll feel better for awhile... She denies prev hx of lung/ breathing problems (no hx athma, bronchitis, pneumonia, etc)...  Current Meds>  She is not on any regular meds x the pain meds prev used...   EXAM showed Afeb, VSS, O2sat=99% on RA;  HEENT- neg;  Chest- clear to P&A, +tender on palpation;  Heart- RR w/o m/r/g;  Ext- w/o c/c/e...   CXR 08/23/14 showed norm heart size, clear lungs, NAD (I reviewed the images on computer)...  Left Rib details 08/06/14 were neg for fx or other abn...  EKG 08/23/14 showed NSR, rate95, poss LAE, otherw wnl...  Spirometry 09/20/14 showed a poor effort & was uninterpretable...  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  ROV in 3-4 weeks to assess response...   Past Medical History  Diagnosis Date  . No pertinent past medical history   . Allergic rhinitis    Past Surgical History  Procedure Laterality Date  . Cesarean section      Outpatient Encounter Prescriptions as of 09/20/2014  Medication Sig  . [DISCONTINUED] acetaminophen-codeine (TYLENOL #3) 300-30 MG per tablet Take 1 tablet by mouth 2 (two) times daily as needed for severe pain (cough). (Patient not taking: Reported on 09/20/2014)  . [DISCONTINUED] HYDROcodone-acetaminophen (NORCO/VICODIN) 5-325 MG per tablet Take 1-2 tablets by mouth every 4 (four) hours as needed. (Patient not taking: Reported on 08/23/2014)  . [DISCONTINUED] ibuprofen (ADVIL,MOTRIN) 400 MG tablet Take 1 tablet (400 mg total) by mouth every 6 (six) hours as needed. (Patient not taking: Reported on 08/23/2014)   Allergies  Allergen Reactions  . Latex Rash    Family History  Problem Relation Age of Onset  . Other Neg Hx   . Throat cancer Mother   . Osteoarthritis Mother    History   Social History  . Marital Status: Widowed    Spouse Name: N/A  . Number of  Children: N/A  . Years of Education: N/A   Occupational History  . cna    Social History Main Topics  . Smoking status: Never Smoker   . Smokeless tobacco: Not on file  . Alcohol Use: No  . Drug Use: No  . Sexual Activity: Not Currently    Birth Control/ Protection: None   Other Topics Concern  . Not on file   Social History Narrative   Current Medications, Allergies, Past Medical History, Past Surgical History, Family History, and Social History were reviewed in [employer] record.   Review of Systems            All symptoms NEG except where BOLDED >>  Constitutional:   Denies F/C/S, anorexia, unexpected weight change. HEENT:  No HA, visual changes, earache, nasal symptoms, sore throat, hoarseness. Resp:  cough, sputum, hemoptysis; SOB, tightness, wheezing. Cardio:  CP, palpit, DOE, orthopnea, edema. GI:  Denies N/V/D/C or blood in stool; no reflux, abd pain, distention, or gas. GU:  No dysuria, freq, urgency, hematuria, or flank pain. MS:  Denies joint pain, swelling, tenderness, or decr ROM; no neck pain, back pain, etc. Neuro:  No tremors, seizures, dizziness, syncope, weakness, numbness, gait abn. Skin:  No suspicious lesions or skin rash. Heme:  No adenopathy, bruising, bleeding. Psyche: Denies confusion, sleep disturbance, hallucinations, anxiety, depression.   Objective:   Physical Exam    Vital Signs:  Reviewed...  General:  WD, WN, 54 y/o WF in NAD; alert & oriented; pleasant & cooperative... HEENT:  [place]/AT; Conjunctiva- pink, Sclera- nonicteric, EOM-wnl, PERRLA, EACs-clear, TMs-wnl; NOSE-clear; THROAT-clear & wnl. Neck:  Supple w/ full ROM; no JVD; normal carotid impulses w/o bruits; no thyromegaly or nodules palpated; no lymphadenopathy. Chest:  Clear to P & A; without wheezes, rales, or rhonchi heard; +tender chest wall & costal margin to palp... Heart:  Regular Rhythm; norm S1 & S2 without murmurs, rubs, or gallops detected. Abdomen:  Soft & nontender- no guarding or rebound; normal bowel sounds; no organomegaly or masses palpated. Ext:  Normal ROM; without deformities or arthritic changes; no varicose veins, venous insuffic, or edema;  Pulses intact w/o bruits. Neuro:  CNs II-XII intact; motor testing normal; sensory testing normal; gait normal & balance OK. Derm:  No lesions noted; no rash etc. Lymph:  No cervical, supraclavicular, axillary, or inguinal adenopathy palpated.   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  ROV in 3-4 weeks to assess response.     Plan:     Patient's Medications  New Prescriptions   CLONAZEPAM (KLONOPIN) 0.5 MG TABLET    Take 1/2- 1 tablet po BID as directed by physician   TRAMADOL (ULTRAM) 50 MG TABLET    Take 1 tablet (50 mg total) by mouth 3 (three) times daily as needed.  Previous Medications   No medications on file  Modified Medications   No medications on file  Discontinued Medications   ACETAMINOPHEN-CODEINE (TYLENOL #3) 300-30 MG PER TABLET    Take 1 tablet by mouth 2 (two) times daily as needed for severe pain (cough).   HYDROCODONE-ACETAMINOPHEN (NORCO/VICODIN) 5-325 MG PER TABLET    Take 1-2 tablets by mouth every 4 (four) hours  as needed.   IBUPROFEN (ADVIL,MOTRIN) 400 MG TABLET    Take 1 tablet (400 mg total) by mouth every 6 (six) hours as needed.

## 2014-09-27 ENCOUNTER — Institutional Professional Consult (permissible substitution): Payer: BLUE CROSS/BLUE SHIELD | Admitting: Internal Medicine

## 2015-01-07 ENCOUNTER — Emergency Department
Admission: EM | Admit: 2015-01-07 | Discharge: 2015-01-08 | Disposition: A | Discharge status: Home / Self Care | Payer: BLUE CROSS/BLUE SHIELD | Attending: Emergency Medicine | Admitting: Emergency Medicine

## 2015-01-07 ENCOUNTER — Other Ambulatory Visit: Payer: Self-pay

## 2015-01-07 ENCOUNTER — Encounter: Payer: Self-pay | Admitting: *Deleted

## 2015-01-07 ENCOUNTER — Emergency Department: Payer: BLUE CROSS/BLUE SHIELD

## 2015-01-07 DIAGNOSIS — R0602 Shortness of breath: Secondary | ICD-10-CM | POA: Diagnosis present

## 2015-01-07 DIAGNOSIS — Z91040 Latex allergy status: Secondary | ICD-10-CM | POA: Diagnosis not present

## 2015-01-07 DIAGNOSIS — R0789 Other chest pain: Secondary | ICD-10-CM | POA: Insufficient documentation

## 2015-01-07 DIAGNOSIS — Z79899 Other long term (current) drug therapy: Secondary | ICD-10-CM | POA: Diagnosis not present

## 2015-01-07 LAB — CBC
HEMATOCRIT: 41 % (ref 35.0–47.0)
Hemoglobin: 13.1 g/dL (ref 12.0–16.0)
MCH: 29.5 pg (ref 26.0–34.0)
MCHC: 32 g/dL (ref 32.0–36.0)
MCV: 92.2 fL (ref 80.0–100.0)
Platelets: 196 10*3/uL (ref 150–440)
RBC: 4.45 MIL/uL (ref 3.80–5.20)
RDW: 14.3 % (ref 11.5–14.5)
WBC: 9.9 10*3/uL (ref 3.6–11.0)

## 2015-01-07 LAB — BASIC METABOLIC PANEL
Anion gap: 8 (ref 5–15)
BUN: 13 mg/dL (ref 6–20)
CO2: 28 mmol/L (ref 22–32)
CREATININE: 0.77 mg/dL (ref 0.44–1.00)
Calcium: 8.6 mg/dL — ABNORMAL LOW (ref 8.9–10.3)
Chloride: 103 mmol/L (ref 101–111)
GFR calc Af Amer: >60 mL/min (ref >60)
GLUCOSE: 88 mg/dL (ref 65–99)
Potassium: 4 mmol/L (ref 3.5–5.1)
Sodium: 139 mmol/L (ref 135–145)

## 2015-01-07 LAB — TROPONIN I: Troponin I: <0.03 ng/mL (ref <0.031)

## 2015-01-07 NOTE — ED Notes (Signed)
Pt states she has had shortness of breath and chest pain  for about 3-4 months, "its just getting worse"

## 2015-01-07 NOTE — ED Provider Notes (Signed)
[facility] Emergency Department Provider Note  ____________________________________________  Time seen: Approximately 10:47 PM  I have reviewed the triage vital signs and the nursing notes.   HISTORY  Chief Complaint Shortness of Breath    HPI Samantha Banks is a 54 y.o. female who reports she's having pain in her left chest and left back. It's worse when she bends over or takes a deep breath. The pain is worse  at work where she does a lot of bending and lifting. Patient is also somewhat short of breath for last 2 days at least with this. Patient has had this intermittently for about a year. Got better then got worse again. Patient talked me worried about it possibly being a symptom with HIV apparently she had a man try to have sex with her but did not penetrate and did not ejaculate. She had her doctor check her afterwards and thinks she was diagnosed with Trichomonas. Uncertain if Dr. Criss Rosales her doctor had checked her for HIV or not. Suggest that she should recontact her doctor and find out if a she was checked for HIV and be possibly get rechecked again. Patient also complained of some swelling this been going on in the left upper leg. She the swelling is just above the knee.   Past Medical History  Diagnosis Date  . No pertinent past medical history   . Allergic rhinitis     Patient Active Problem List   Diagnosis Date Noted  . Dyspnea 09/20/2014  . Chest wall pain 09/20/2014    Past Surgical History  Procedure Laterality Date  . Cesarean section      Current Outpatient Rx  Name  Route  Sig  Dispense  Refill  . clonazePAM (KLONOPIN) 0.5 MG tablet      Take 1/2- 1 tablet po BID as directed by physician   60 tablet   0   . ibuprofen (ADVIL,MOTRIN) 800 MG tablet   Oral   Take 1 tablet (800 mg total) by mouth every 8 (eight) hours as needed.   30 tablet   0   . traMADol (ULTRAM) 50 MG tablet   Oral   Take 1 tablet (50 mg total) by mouth  3 (three) times daily as needed.   90 tablet   0     Allergies Latex  Family History  Problem Relation Age of Onset  . Other Neg Hx   . Throat cancer Mother   . Osteoarthritis Mother     Social History History  Substance Use Topics  . Smoking status: Never Smoker   . Smokeless tobacco: Not on file  . Alcohol Use: No    Review of Systems Constitutional: No fever/chills Eyes: No visual changes. ENT: No sore throat. Cardiovascular: See history of present illness Respiratory: See history of present illness Gastrointestinal: No abdominal pain.  No nausea, no vomiting.  No diarrhea.  No constipation. Genitourinary: Negative for dysuria. Musculoskeletal: Negative for back pain. Skin: Negative for rash. Neurological: Negative for headaches, focal weakness or numbness.  10-point ROS otherwise negative.  ____________________________________________   PHYSICAL EXAM:  VITAL SIGNS: ED Triage Vitals  Enc Vitals Group     BP 01/07/15 1813 142/80 mmHg     Pulse Rate 01/07/15 1813 72     Resp 01/07/15 1813 16     Temp 01/07/15 1813 98.7 F (37.1 C)     Temp Source 01/07/15 1813 Oral     SpO2 01/07/15 1813 100 %  Weight 01/07/15 1813 143 lb (64.864 kg)     Height 01/07/15 1813 4\' 11"  (1.499 m)     Head Cir --      Peak Flow --      Pain Score 01/07/15 1814 7     Pain Loc --      Pain Edu? --      Excl. in [place]? --     Constitutional: Alert and oriented. Well appearing and in no acute distress. Eyes: Conjunctivae are normal. PERRL. EOMI. Head: Atraumatic. Nose: No congestion/rhinnorhea. Mouth/Throat: Mucous membranes are moist.  Oropharynx non-erythematous. Neck: No stridor.   Cardiovascular: Normal rate, regular rhythm. Grossly normal heart sounds.  Good peripheral circulation. Respiratory: Normal respiratory effort.  No retractions. Lungs CTAB. Gastrointestinal: Soft and nontender. No distention. No abdominal bruits. No CVA tenderness. Musculoskeletal: No lower  extremity tenderness nor edema. The left upper leg is however 1 inch bigger in circumference than the right 3 inches above the knee  No joint effusions. Neurologic:  Normal speech and language. No gross focal neurologic deficits are appreciated. No gait instability. Skin:  Skin is warm, dry and intact. No rash noted. Psychiatric: Mood and affect are normal. Speech and behavior are normal.  ____________________________________________   LABS (all labs ordered are listed, but only abnormal results are displayed)  Labs Reviewed  BASIC METABOLIC PANEL - Abnormal; Notable for the following:    Calcium 8.6 (*)    All other components within normal limits  CBC  TROPONIN I  FIBRIN DERIVATIVES D-DIMER ([facility] ONLY)   ____________________________________________  EKG EKG read and interpreted by me shows normal sinus rhythm at a rate of 67 normal axis normal EKG similar to one done 08/23/2014 ____________________________________________  RADIOLOGY  Chest x-ray read by radiology as normal ____________________________________________   PROCEDURES    ____________________________________________   INITIAL IMPRESSION / ASSESSMENT AND PLAN / ED COURSE  Pertinent labs & imaging results that were available during my care of the patient were reviewed by me and considered in my medical decision making (see chart for details). Patient's d-dimer is negative. We'll treat the patient's chest wall pain patient follow-up with her family physician  ____________________________________________   FINAL CLINICAL IMPRESSION(S) / ED DIAGNOSES  Final diagnoses:  Chest wall pain      Nena Polio, MD 01/08/15 [phone]

## 2015-01-07 NOTE — ED Notes (Signed)
States pain is worse when working.  Job requires a lot of bending.

## 2015-01-07 NOTE — ED Notes (Signed)
AAOx3.  Skin warm and dry.  No SOB/ DOE.  NAD 

## 2015-01-08 LAB — FIBRIN DERIVATIVES D-DIMER (ARMC ONLY): Fibrin derivatives D-dimer (ARMC): 107 (ref 0–499)

## 2015-01-08 MED ORDER — IBUPROFEN 800 MG PO TABS: 800.0000 mg | ORAL_TABLET | Freq: Three times a day (TID) | ORAL | Status: DC | PRN

## 2015-01-08 NOTE — Discharge Instructions (Signed)
Chest Wall Pain Chest wall pain is pain in or around the bones and muscles of your chest. It may take up to 6 weeks to get better. It may take longer if you must stay physically active in your work and activities.  CAUSES  Chest wall pain may happen on its own. However, it may be caused by:  A viral illness like the flu.  Injury.  Coughing.  Exercise.  Arthritis.  Fibromyalgia.  Shingles. HOME CARE INSTRUCTIONS   Avoid overtiring physical activity. Try not to strain or perform activities that cause pain. This includes any activities using your chest or your abdominal and side muscles, especially if heavy weights are used.  Put ice on the sore area.  Put ice in a plastic bag.  Place a towel between your skin and the bag.  Leave the ice on for 15-20 minutes per hour while awake for the first 2 days.  Only take over-the-counter or prescription medicines for pain, discomfort, or fever as directed by your caregiver. SEEK IMMEDIATE MEDICAL CARE IF:   Your pain increases, or you are very uncomfortable.  You have a fever.  Your chest pain becomes worse.  You have new, unexplained symptoms.  You have nausea or vomiting.  You feel sweaty or lightheaded.  You have a cough with phlegm (sputum), or you cough up blood. MAKE SURE YOU:   Understand these instructions.  Will watch your condition.  Will get help right away if you are not doing well or get worse. Document Released: 05/28/2005 Document Revised: 08/20/2011 Document Reviewed: 01/22/2011 [facility] Patient Information 2015 [place], [place]. This information is not intended to replace advice given to you by your health care provider. Make sure you discuss any questions you have with your health care provider. Please follow up with your doctor. Return for worse pain, fever or worse shortness of breath. For now use the motrin 1 pill 3 x a day with food. Please talk to your doctor about the tests she ran and ask her about an  HIV test if you are worried about that.

## 2015-03-30 DIAGNOSIS — J301 Allergic rhinitis due to pollen: Secondary | ICD-10-CM | POA: Insufficient documentation

## 2015-04-13 DIAGNOSIS — G43709 Chronic migraine without aura, not intractable, without status migrainosus: Secondary | ICD-10-CM | POA: Insufficient documentation

## 2015-04-13 DIAGNOSIS — IMO0002 Reserved for concepts with insufficient information to code with codable children: Secondary | ICD-10-CM | POA: Insufficient documentation

## 2015-05-24 ENCOUNTER — Other Ambulatory Visit: Payer: Self-pay | Admitting: Family Medicine

## 2015-05-24 DIAGNOSIS — R51 Headache: Principal | ICD-10-CM

## 2015-05-24 DIAGNOSIS — R519 Headache, unspecified: Secondary | ICD-10-CM

## 2015-05-27 ENCOUNTER — Ambulatory Visit
Admission: RE | Admit: 2015-05-27 | Discharge: 2015-05-27 | Disposition: A | Discharge status: Home / Self Care | Payer: BLUE CROSS/BLUE SHIELD | Source: Ambulatory Visit | Attending: Family Medicine | Admitting: Family Medicine

## 2015-05-27 DIAGNOSIS — R51 Headache: Secondary | ICD-10-CM | POA: Diagnosis present

## 2015-05-27 DIAGNOSIS — R519 Headache, unspecified: Secondary | ICD-10-CM

## 2015-05-27 MED ORDER — IOHEXOL 300 MG/ML  SOLN
75.0000 mL | Freq: Once | INTRAMUSCULAR | Status: AC | PRN
  Administered 2015-05-27: 75 mL via INTRAVENOUS

## 2015-06-12 ENCOUNTER — Emergency Department (HOSPITAL_COMMUNITY): Payer: BLUE CROSS/BLUE SHIELD

## 2015-06-12 ENCOUNTER — Emergency Department (HOSPITAL_COMMUNITY)
Admission: EM | Admit: 2015-06-12 | Discharge: 2015-06-12 | Disposition: A | Discharge status: Home / Self Care | Payer: BLUE CROSS/BLUE SHIELD | Attending: Emergency Medicine | Admitting: Emergency Medicine

## 2015-06-12 ENCOUNTER — Encounter (HOSPITAL_COMMUNITY): Payer: Self-pay | Admitting: Emergency Medicine

## 2015-06-12 DIAGNOSIS — Z91040 Latex allergy status: Secondary | ICD-10-CM | POA: Insufficient documentation

## 2015-06-12 DIAGNOSIS — R079 Chest pain, unspecified: Secondary | ICD-10-CM | POA: Diagnosis present

## 2015-06-12 DIAGNOSIS — R0600 Dyspnea, unspecified: Secondary | ICD-10-CM | POA: Diagnosis not present

## 2015-06-12 DIAGNOSIS — Z79899 Other long term (current) drug therapy: Secondary | ICD-10-CM | POA: Diagnosis not present

## 2015-06-12 DIAGNOSIS — R0789 Other chest pain: Secondary | ICD-10-CM | POA: Diagnosis not present

## 2015-06-12 DIAGNOSIS — Z3202 Encounter for pregnancy test, result negative: Secondary | ICD-10-CM | POA: Diagnosis not present

## 2015-06-12 LAB — I-STAT TROPONIN, ED
Troponin i, poc: 0 ng/mL (ref 0.00–0.08)
Troponin i, poc: 0.01 ng/mL (ref 0.00–0.08)

## 2015-06-12 LAB — BASIC METABOLIC PANEL
ANION GAP: 10 (ref 5–15)
BUN: 10 mg/dL (ref 6–20)
CO2: 28 mmol/L (ref 22–32)
Calcium: 9.1 mg/dL (ref 8.9–10.3)
Chloride: 105 mmol/L (ref 101–111)
Creatinine, Ser: 0.58 mg/dL (ref 0.44–1.00)
GFR calc Af Amer: >60 mL/min (ref >60)
Glucose, Bld: 112 mg/dL — ABNORMAL HIGH (ref 65–99)
Potassium: 3.6 mmol/L (ref 3.5–5.1)
SODIUM: 143 mmol/L (ref 135–145)

## 2015-06-12 LAB — CBC
HCT: 42.2 % (ref 36.0–46.0)
Hemoglobin: 13.6 g/dL (ref 12.0–15.0)
MCH: 30 pg (ref 26.0–34.0)
MCHC: 32.2 g/dL (ref 30.0–36.0)
MCV: 93 fL (ref 78.0–100.0)
PLATELETS: 226 10*3/uL (ref 150–400)
RBC: 4.54 MIL/uL (ref 3.87–5.11)
RDW: 13.4 % (ref 11.5–15.5)
WBC: 11.3 10*3/uL — ABNORMAL HIGH (ref 4.0–10.5)

## 2015-06-12 LAB — HCG, QUANTITATIVE, PREGNANCY: hCG, Beta Chain, Quant, S: 4 m[IU]/mL (ref <5)

## 2015-06-12 LAB — D-DIMER, QUANTITATIVE: D-Dimer, Quant: 4.47 ug/mL-FEU — ABNORMAL HIGH (ref 0.00–0.50)

## 2015-06-12 MED ORDER — IOHEXOL 350 MG/ML SOLN
100.0000 mL | Freq: Once | INTRAVENOUS | Status: AC | PRN
  Administered 2015-06-12: 75 mL via INTRAVENOUS

## 2015-06-12 MED ORDER — IBUPROFEN 800 MG PO TABS
800.0000 mg | ORAL_TABLET | Freq: Once | ORAL | Status: AC
  Administered 2015-06-12: 800 mg via ORAL
  Filled 2015-06-12: qty 1

## 2015-06-12 MED ORDER — MORPHINE SULFATE (PF) 4 MG/ML IV SOLN
4.0000 mg | Freq: Once | INTRAVENOUS | Status: DC
  Filled 2015-06-12: qty 1

## 2015-06-12 MED ORDER — NAPROXEN 500 MG PO TABS: 500.0000 mg | ORAL_TABLET | Freq: Two times a day (BID) | ORAL | Status: DC

## 2015-06-12 NOTE — ED Notes (Signed)
Patient transported to CT 

## 2015-06-12 NOTE — ED Notes (Signed)
PT CALLED FOR BLOOD DRAW NO RESPONSE

## 2015-06-12 NOTE — ED Provider Notes (Signed)
CSN: [id number]     Arrival date & time 06/12/15  1528 History   First MD Initiated Contact with Patient 06/12/15 1754     Chief Complaint  Patient presents with  . Chest Pain  . Shortness of Breath     (Consider location/radiation/quality/duration/timing/severity/associated sxs/prior Treatment) HPI   55 year old female without any significant past medical history who presents for evaluation of chest pain and shortness of breath. Patient reports gradual onset of persistent left-sided chest pain and shortness of breath since last night. She described the discomfort as a pressure sensation radiates to her back with having some shortness of breath and having to breathe deeply to catch her breath. The symptom has been persistent throughout the day and currently rated as 7 out of 10. Movement does aggravate her symptoms. No associated fever, chills, headache, lightheadedness, dizziness, diaphoresis, nausea vomiting diarrhea, abdominal pain, focal numbness or weakness. 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 She does not have any significant cardiac history, she is not a smoker and denies any recent alcohol use. No strong family history of premature cardiac death. No prior history of PE or DVT, no recent surgery, prolonged bed rest, unilateral leg swelling or calf pain, active cancer, or taking hormone. She denies any specific treatment tried.  Past Medical History  Diagnosis Date  . No pertinent past medical history   . Allergic rhinitis    Past Surgical History  Procedure Laterality Date  . Cesarean section     Family History  Problem Relation Age of Onset  . Other Neg Hx   . Throat cancer Mother   . Osteoarthritis Mother    Social History  Substance Use Topics  . Smoking status: Never Smoker   . Smokeless tobacco: None  . Alcohol Use: No   OB History    Gravida Para Term Preterm AB TAB SAB Ectopic Multiple Living   3 3 3  0 0 0 0 0 0 3      Review of Systems  All other systems reviewed and are negative.     Allergies  Latex  Home Medications   Prior to Admission medications   Medication Sig Start Date End Date Taking? Authorizing Provider  clonazePAM (KLONOPIN) 0.5 MG tablet Take 1/2- 1 tablet po BID as directed by physician 09/20/14   Noralee Space, MD  ibuprofen (ADVIL,MOTRIN) 800 MG tablet Take 1 tablet (800 mg total) by mouth every 8 (eight) hours as needed. 01/08/15   Nena Polio, MD  traMADol (ULTRAM) 50 MG tablet Take 1 tablet (50 mg total) by mouth 3 (three) times daily as needed. 09/20/14   Noralee Space, MD   BP 119/64 mmHg  Pulse 77  Temp(Src) 97.6 F (36.4 C) (Oral)  Resp 20  SpO2 100%  LMP 02/01/2012 Physical Exam  Constitutional: She appears well-developed and well-nourished. No distress.  African-American female resting in bed in no acute discomfort.  HENT:  Head: Atraumatic.  Eyes: Conjunctivae are normal.  Neck: Neck supple. No JVD present.  Cardiovascular: Normal rate, regular rhythm and intact distal pulses.   Pulmonary/Chest: Effort normal and breath sounds normal. No respiratory distress. She has no wheezes. She has no rales. She exhibits tenderness (Mild tenderness to anterior chest wall on palpation without crepitus or emphysema. No rash noted.).  Abdominal: Soft. There is no tenderness.  Musculoskeletal:  Bilateral lower extremities without palpable cords, erythema, edema.   Neurological: She is alert.  Skin: No  rash noted.  Psychiatric: She has a normal mood and affect.  Nursing note and vitals reviewed.   ED Course  Procedures (including critical care time) Labs Review Labs Reviewed  BASIC METABOLIC PANEL - Abnormal; Notable for the following:    Glucose, Bld 112 (*)    All other components within normal limits  CBC - Abnormal; Notable for the following:    WBC 11.3 (*)    All other components within normal limits  D-DIMER, QUANTITATIVE (NOT AT [facility]) - Abnormal; Notable  for the following:    D-Dimer, Quant 4.47 (*)    All other components within normal limits  HCG, QUANTITATIVE, PREGNANCY  I-STAT TROPOININ, ED  Randolm Idol, ED    Imaging Review Dg Chest 2 View  06/12/2015  CLINICAL DATA:  55 year old female with acute left chest pain and shortness of breath for 1 day. EXAM: CHEST  2 VIEW COMPARISON:  01/07/2015 and prior chest radiographs dating back to 10/30/2006 FINDINGS: The cardiomediastinal silhouette is unremarkable. There is no evidence of focal airspace disease, pulmonary edema, suspicious pulmonary nodule/mass, pleural effusion, or pneumothorax. No acute bony abnormalities are identified. IMPRESSION: No active cardiopulmonary disease. Electronically Signed   By: Margarette Canada M.D.   On: 06/12/2015 16:09   Ct Angio Chest Pe W/cm &/or Wo Cm  06/12/2015  CLINICAL DATA:  Left chest pressure in shortness of Breath starting last night. EXAM: CT ANGIOGRAPHY CHEST WITH CONTRAST TECHNIQUE: Multidetector CT imaging of the chest was performed using the standard protocol during bolus administration of intravenous contrast. Multiplanar CT image reconstructions and MIPs were obtained to evaluate the vascular anatomy. CONTRAST:  29mL OMNIPAQUE IOHEXOL 350 MG/ML SOLN COMPARISON:  06/12/2015 FINDINGS: Mediastinum/Nodes: No filling defect is identified in the pulmonary arterial tree to suggest pulmonary embolus. There is some fluid in the superior pericardial recess adjacent to the aorta but no aortic dissection or acute aortic abnormality observed. Heart size normal. No thoracic adenopathy. Lungs/Pleura: Calcified granuloma in the apical segment right upper lobe. Mild dependent subsegmental atelectasis in the posterior basal segments of both lower lobes. Upper abdomen: Unremarkable Musculoskeletal: Unremarkable Review of the MIP images confirms the above findings. IMPRESSION: 1. No filling defect is identified in the pulmonary arterial tree to suggest pulmonary embolus. No  acute findings. Electronically Signed   By: Van Clines M.D.   On: 06/12/2015 21:47   I have personally reviewed and evaluated these images and lab results as part of my medical decision-making.   EKG Interpretation   Date/Time:  Sunday June 12 2015 15:35:17 EST Ventricular Rate:  78 PR Interval:  159 QRS Duration: 82 QT Interval:  377 QTC Calculation: 429 R Axis:   56 Text Interpretation:  Sinus rhythm since last tracing no significant  change Confirmed by BELFI  MD, MELANIE ([id number]) on 06/12/2015 3:39:37 PM      MDM   Final diagnoses:  Chest wall pain  Dyspnea    BP 111/67 mmHg  Pulse 65  Temp(Src) 97.6 F (36.4 C) (Oral)  Resp 13  SpO2 100%  LMP 02/01/2012   6:34 PM Patient presents with reproducible chest wall discomfort but also reported having shortness of breath. Although she does not have any significant risk factors for PE, due to her age, a d-dimer was obtained. Her heart score is 1, low suspicion for MACE.  9:52 PM D-dimer was elevated at 4.47. Chest CT and she'll was obtained and results shown no evidence of PE or any other acute finding. Patient has a negative  delta troponin. Pt currently resting comfortably.  Results of testing and imaging was discussed with pt.  All questions answer to pt's satisfaction.  Pt will f/u with PCP for further care.  Return precaution discussed.  Care discussed with Dr. Nanetta Batty, [name] 06/12/15 2158  Merrily Pew, MD 06/13/15 1550

## 2015-06-12 NOTE — ED Notes (Signed)
Patient is resting comfortably. 

## 2015-06-12 NOTE — Discharge Instructions (Signed)
Please follow up closely with your doctor for further evaluation of your pain.  Return if your condition worsen or if you have other concerns.    Chest Wall Pain Chest wall pain is pain in or around the bones and muscles of your chest. Sometimes, an injury causes this pain. Sometimes, the cause may not be known. This pain may take several weeks or longer to get better. HOME CARE INSTRUCTIONS  Pay attention to any changes in your symptoms. Take these actions to help with your pain:   Rest as told by your health care provider.   Avoid activities that cause pain. These include any activities that use your chest muscles or your abdominal and side muscles to lift heavy items.   If directed, apply ice to the painful area:  Put ice in a plastic bag.  Place a towel between your skin and the bag.  Leave the ice on for 20 minutes, 2-3 times per day.  Take over-the-counter and prescription medicines only as told by your health care provider.  Do not use tobacco products, including cigarettes, chewing tobacco, and e-cigarettes. If you need help quitting, ask your health care provider.  Keep all follow-up visits as told by your health care provider. This is important. SEEK MEDICAL CARE IF:  You have a fever.  Your chest pain becomes worse.  You have new symptoms. SEEK IMMEDIATE MEDICAL CARE IF:  You have nausea or vomiting.  You feel sweaty or light-headed.  You have a cough with phlegm (sputum) or you cough up blood.  You develop shortness of breath.   This information is not intended to replace advice given to you by your health care provider. Make sure you discuss any questions you have with your health care provider.   Document Released: 05/28/2005 Document Revised: 02/16/2015 Document Reviewed: 08/23/2014 Elsevier Interactive Patient Education [employer].

## 2015-06-12 NOTE — ED Notes (Signed)
Pt states that she has had L sided chest pressure and SOB since last night. Denies nausea or hx of same. Alert and oriented.

## 2015-12-13 ENCOUNTER — Emergency Department (HOSPITAL_COMMUNITY): Payer: Self-pay

## 2015-12-13 ENCOUNTER — Encounter (HOSPITAL_COMMUNITY): Payer: Self-pay | Admitting: Emergency Medicine

## 2015-12-13 ENCOUNTER — Emergency Department (HOSPITAL_COMMUNITY)
Admission: EM | Admit: 2015-12-13 | Discharge: 2015-12-14 | Disposition: A | Discharge status: Home / Self Care | Payer: Self-pay | Attending: Emergency Medicine | Admitting: Emergency Medicine

## 2015-12-13 DIAGNOSIS — Z91040 Latex allergy status: Secondary | ICD-10-CM | POA: Insufficient documentation

## 2015-12-13 DIAGNOSIS — R509 Fever, unspecified: Secondary | ICD-10-CM | POA: Insufficient documentation

## 2015-12-13 DIAGNOSIS — M549 Dorsalgia, unspecified: Secondary | ICD-10-CM | POA: Insufficient documentation

## 2015-12-13 DIAGNOSIS — R079 Chest pain, unspecified: Secondary | ICD-10-CM | POA: Insufficient documentation

## 2015-12-13 DIAGNOSIS — Z79899 Other long term (current) drug therapy: Secondary | ICD-10-CM | POA: Insufficient documentation

## 2015-12-13 DIAGNOSIS — R1013 Epigastric pain: Secondary | ICD-10-CM | POA: Insufficient documentation

## 2015-12-13 LAB — I-STAT TROPONIN, ED: TROPONIN I, POC: 0 ng/mL (ref 0.00–0.08)

## 2015-12-13 LAB — HEPATIC FUNCTION PANEL
ALBUMIN: 4.4 g/dL (ref 3.5–5.0)
ALK PHOS: 62 U/L (ref 38–126)
ALT: 19 U/L (ref 14–54)
AST: 27 U/L (ref 15–41)
BILIRUBIN INDIRECT: 0.6 mg/dL (ref 0.3–0.9)
Bilirubin, Direct: 0.3 mg/dL (ref 0.1–0.5)
TOTAL PROTEIN: 7.1 g/dL (ref 6.5–8.1)
Total Bilirubin: 0.9 mg/dL (ref 0.3–1.2)

## 2015-12-13 LAB — BASIC METABOLIC PANEL
Anion gap: 9 (ref 5–15)
BUN: 10 mg/dL (ref 6–20)
CALCIUM: 8.7 mg/dL — AB (ref 8.9–10.3)
CHLORIDE: 101 mmol/L (ref 101–111)
CO2: 24 mmol/L (ref 22–32)
CREATININE: 0.57 mg/dL (ref 0.44–1.00)
GFR calc Af Amer: >60 mL/min (ref >60)
GFR calc non Af Amer: >60 mL/min (ref >60)
GLUCOSE: 91 mg/dL (ref 65–99)
Potassium: 4.1 mmol/L (ref 3.5–5.1)
Sodium: 134 mmol/L — ABNORMAL LOW (ref 135–145)

## 2015-12-13 LAB — URINE MICROSCOPIC-ADD ON

## 2015-12-13 LAB — URINALYSIS, ROUTINE W REFLEX MICROSCOPIC
Bilirubin Urine: NEGATIVE
GLUCOSE, UA: NEGATIVE mg/dL
Hgb urine dipstick: NEGATIVE
KETONES UR: 15 mg/dL — AB
Nitrite: NEGATIVE
PH: 7 (ref 5.0–8.0)
Protein, ur: NEGATIVE mg/dL
SPECIFIC GRAVITY, URINE: 1.009 (ref 1.005–1.030)

## 2015-12-13 LAB — CBC
HCT: 40.3 % (ref 36.0–46.0)
HEMOGLOBIN: 13.2 g/dL (ref 12.0–15.0)
MCH: 29.8 pg (ref 26.0–34.0)
MCHC: 32.8 g/dL (ref 30.0–36.0)
MCV: 91 fL (ref 78.0–100.0)
PLATELETS: 179 10*3/uL (ref 150–400)
RBC: 4.43 MIL/uL (ref 3.87–5.11)
RDW: 13.7 % (ref 11.5–15.5)
WBC: 10.6 10*3/uL — ABNORMAL HIGH (ref 4.0–10.5)

## 2015-12-13 LAB — I-STAT CG4 LACTIC ACID, ED: Lactic Acid, Venous: 1.51 mmol/L (ref 0.5–1.9)

## 2015-12-13 LAB — LIPASE, BLOOD: Lipase: 22 U/L (ref 11–51)

## 2015-12-13 MED ORDER — IBUPROFEN 200 MG PO TABS
600.0000 mg | ORAL_TABLET | Freq: Once | ORAL | Status: AC
  Administered 2015-12-13: 600 mg via ORAL
  Filled 2015-12-13: qty 3

## 2015-12-13 MED ORDER — ACETAMINOPHEN 500 MG PO TABS
1000.0000 mg | ORAL_TABLET | Freq: Once | ORAL | Status: AC
  Administered 2015-12-13: 1000 mg via ORAL
  Filled 2015-12-13: qty 2

## 2015-12-13 MED ORDER — SODIUM CHLORIDE 0.9 % IV BOLUS (SEPSIS)
1000.0000 mL | Freq: Once | INTRAVENOUS | Status: AC
  Administered 2015-12-13: 1000 mL via INTRAVENOUS

## 2015-12-13 NOTE — ED Notes (Signed)
Patient transported to X-ray 

## 2015-12-13 NOTE — ED Notes (Signed)
Per patient, she is complaining of chest pain that radiates all over her body.  It started 3 hours ago.  Has some nausea.  She states she has a headache.

## 2015-12-13 NOTE — ED Provider Notes (Signed)
CSN: [id number]     Arrival date & time 12/13/15  1732 History  By signing my name below, I, Samantha Banks, attest that this documentation has been prepared under the direction and in the presence of Elnora Morrison, MD . Electronically Signed: Evelene Banks, Scribe. 12/13/2015. 7:17 PM.     Chief Complaint  Patient presents with  . Chest Pain    The history is provided by the patient. No language interpreter was used.     HPI Comments:  Samantha Banks is a 55 y.o. female who presents to the Emergency Department complaining of moderate CP x ~ 3 hours. She states her pain radiates to her back.  She reports associated chills, fever with TMAX of 102, and generalized weakness. She also reports a HA; describes a pressure in her head. Pt reports a history of the same intermittently for the last 6 months. She denies personal and family history of cardiac issues. She also denies h/o blood clots, h/o IVDA, recent travel outside of the country and recent surgery. Pt denies nausea, vomiting, diarrhea, and cough. No sick contacts at home.  No alleviating factors noted.  Past Medical History  Diagnosis Date  . No pertinent past medical history   . Allergic rhinitis    Past Surgical History  Procedure Laterality Date  . Cesarean section     Family History  Problem Relation Age of Onset  . Other Neg Hx   . Throat cancer Mother   . Osteoarthritis Mother    Social History  Substance Use Topics  . Smoking status: Never Smoker   . Smokeless tobacco: None  . Alcohol Use: No   OB History    Gravida Para Term Preterm AB TAB SAB Ectopic Multiple Living   3 3 3  0 0 0 0 0 0 3     Review of Systems  Constitutional: Positive for fever and chills.  Respiratory: Negative for cough.   Cardiovascular: Positive for chest pain.  Gastrointestinal: Negative for nausea, vomiting and diarrhea.  Musculoskeletal: Positive for back pain.  Neurological: Positive for weakness (generalized) and headaches.  All  other systems reviewed and are negative.   Allergies  Latex and Contrast media  Home Medications   Prior to Admission medications   Medication Sig Start Date End Date Taking? Authorizing Provider  acetaminophen (TYLENOL) 500 MG tablet Take 1,000 mg by mouth every 6 (six) hours as needed for moderate pain or headache.   Yes Historical Provider, MD  cephALEXin (KEFLEX) 250 MG capsule Take 1 capsule (250 mg total) by mouth 4 (four) times daily. 12/17/15   Konrad Felix, PA   BP 106/73 mmHg  Pulse 63  Temp(Src) 100.4 F (38 C) (Oral)  Resp 18  Ht 4\' 11"  (1.499 m)  Wt 145 lb (65.772 kg)  BMI 29.27 kg/m2  SpO2 99%  LMP 02/01/2012 Physical Exam  Constitutional: She is oriented to person, place, and time. She appears well-developed and well-nourished. No distress.  HENT:  Head: Normocephalic and atraumatic.  Mild dry mucous membranes   Eyes: Conjunctivae are normal.  Cardiovascular: Normal rate, regular rhythm and normal heart sounds.   Pulmonary/Chest: Effort normal and breath sounds normal. No respiratory distress.  Abdominal: Soft. She exhibits no distension. There is tenderness.  Umbilical and epigastric region;  no peritonitis  No focal flank pain   Neurological: She is alert and oriented to person, place, and time.  Skin: Skin is warm and dry.  Psychiatric: She has a normal mood and  affect.  Nursing note and vitals reviewed.   ED Course  Procedures  DIAGNOSTIC STUDIES:  Oxygen Saturation is 100% on RA, normal by my interpretation.    COORDINATION OF CARE:  6:30 PM Discussed treatment plan with pt at bedside and pt agreed to plan.  Labs Review Labs Reviewed  BASIC METABOLIC PANEL - Abnormal; Notable for the following:    Sodium 134 (*)    Calcium 8.7 (*)    All other components within normal limits  CBC - Abnormal; Notable for the following:    WBC 10.6 (*)    All other components within normal limits  URINALYSIS, ROUTINE W REFLEX MICROSCOPIC (NOT AT [facility]) -  Abnormal; Notable for the following:    Ketones, ur 15 (*)    Leukocytes, UA TRACE (*)    All other components within normal limits  URINE MICROSCOPIC-ADD ON - Abnormal; Notable for the following:    Squamous Epithelial / LPF 0-5 (*)    Bacteria, UA RARE (*)    All other components within normal limits  HEPATIC FUNCTION PANEL  LIPASE, BLOOD  I-STAT TROPOININ, ED  I-STAT CG4 LACTIC ACID, ED    Imaging Review No results found. I have personally reviewed and evaluated these images and lab results as part of my medical decision-making.   EKG Interpretation   Date/Time:  Tuesday December 13 2015 17:54:31 EDT Ventricular Rate:  95 PR Interval:    QRS Duration: 76 QT Interval:  321 QTC Calculation: 404 R Axis:   60 Text Interpretation:  Sinus rhythm similar to previous Confirmed by Zollie Ellery  MD, Samvel Zinn [phone]) on 12/13/2015 6:08:49 PM      MDM   Final diagnoses:  Fever, unspecified fever cause   Pt presents with epig pain and fever.  No pelvic sxs.  Pt well appearing otherwise.  No concerning rashes on exam. No meningismus or encephalitis.   Pt had cardiac screen in triage with epig pain.  Pt signed out to reassess and fup CT scan for final dispo.  Results and differential diagnosis were discussed with the patient/parent/guardian. Xrays were independently reviewed by myself.  Close follow up outpatient was discussed, comfortable with the plan.   Medications  acetaminophen (TYLENOL) tablet 1,000 mg (1,000 mg Oral Given 12/13/15 1910)  sodium chloride 0.9 % bolus 1,000 mL (0 mLs Intravenous Stopped 12/13/15 2010)  ibuprofen (ADVIL,MOTRIN) tablet 600 mg (600 mg Oral Given 12/13/15 2059)  iopamidol (ISOVUE-300) 61 % injection 100 mL (100 mLs Intravenous Contrast Given 12/14/15 0003)  diphenhydrAMINE (BENADRYL) injection 50 mg (50 mg Intravenous Given 12/14/15 0032)    Filed Vitals:   12/13/15 2043 12/13/15 2121 12/14/15 0158 12/14/15 0405  BP:  102/59 112/65 106/73  Pulse:  82 69 63   Temp: 100.4 F (38 C)     TempSrc: Oral     Resp:  16 18 18   Height:      Weight:      SpO2:  98% 98% 99%    Final diagnoses:  Fever, unspecified fever cause     Elnora Morrison, MD 12/26/15 [phone]

## 2015-12-14 ENCOUNTER — Emergency Department (HOSPITAL_COMMUNITY): Payer: Self-pay

## 2015-12-14 MED ORDER — DIPHENHYDRAMINE HCL 50 MG/ML IJ SOLN
50.0000 mg | Freq: Once | INTRAMUSCULAR | Status: AC
  Administered 2015-12-14: 50 mg via INTRAVENOUS
  Filled 2015-12-14: qty 1

## 2015-12-14 MED ORDER — IOPAMIDOL (ISOVUE-300) INJECTION 61%
100.0000 mL | Freq: Once | INTRAVENOUS | Status: AC | PRN
  Administered 2015-12-14: 100 mL via INTRAVENOUS

## 2015-12-14 NOTE — ED Provider Notes (Signed)
Patient signed out by Dr. Reather Converse as pending CT scan for evaluation of fever. CT is negative.  Rec for PCP fu in 2 days.  No other infectious source found.  VS remain within her normal limits.  Dr. Reather Converse states pelvic exam is not indicated in this case.  Patient safe for DC.  Everlene Balls, MD 12/14/15 [phone]

## 2015-12-14 NOTE — ED Notes (Signed)
Pt drove self here and has had medications that are contraindicated to drive while taking  Will hold pt until she has a ride home or it is safe for her to drive

## 2015-12-14 NOTE — Discharge Instructions (Signed)
Fever, Adult Ms. Vanyo, your CT scan did not show a cause for your fever.  Continue tylenol and ibuprofen as needed for fever and see your primary doctor in 2 days for repeat evaluation. If any symptoms worsen, come back to the ED immediately. Thank you. A fever is an increase in the body's temperature. It is often defined as a temperature of 100 F (38C) or higher. Short mild or moderate fevers often have no long-term effects. They also often do not need treatment. Moderate or high fevers may make you feel uncomfortable. Sometimes, they can also be a sign of a serious illness or disease. The sweating that may happen with repeated fevers or fevers that last a while may also cause you to not have enough fluid in your body (dehydration). You can take your temperature with a thermometer to see if you have a fever. A measured temperature can change with:  Age.  Time of day.  Where the thermometer is placed:  Mouth (oral).  Rectum (rectal).  Ear (tympanic).  Underarm (axillary).  Forehead (temporal). HOME CARE Pay attention to any changes in your symptoms. Take these actions to help with your condition:  Take over-the-counter and prescription medicines only as told by your doctor. Follow the dosing instructions carefully.  If you were prescribed an antibiotic medicine, take it as told by your doctor. Do not stop taking the antibiotic even if you start to feel better.  Rest as needed.  Drink enough fluid to keep your pee (urine) clear or pale yellow.  Sponge yourself or bathe with room-temperature water as needed. This helps to lower your body temperature . Do not use ice water.  Do not wear too many blankets or heavy clothes. GET HELP IF:  You throw up (vomit).  You cannot eat or drink without throwing up.  You have watery poop (diarrhea).  It hurts when you pee.  Your symptoms do not get better with treatment.  You have new symptoms.  You feel very weak. GET HELP  RIGHT AWAY IF:  You are short of breath or have trouble breathing.  You are dizzy or you pass out (faint).  You feel confused.  You have signs of not having enough fluid in your body, such as:  A dry mouth.  Peeing less.  Looking pale.  You have very bad pain in your belly (abdomen).  You keep throwing up or having water poop.  You have a skin rash.  Your symptoms suddenly get worse.   This information is not intended to replace advice given to you by your health care provider. Make sure you discuss any questions you have with your health care provider.   Document Released: 03/06/2008 Document Revised: 02/16/2015 Document Reviewed: 07/22/2014 Elsevier Interactive Patient Education [employer].

## 2015-12-14 NOTE — ED Notes (Signed)
Patient transported to CT 

## 2015-12-17 ENCOUNTER — Encounter (HOSPITAL_COMMUNITY): Payer: Self-pay | Admitting: Emergency Medicine

## 2015-12-17 ENCOUNTER — Ambulatory Visit (HOSPITAL_COMMUNITY)
Admission: EM | Admit: 2015-12-17 | Discharge: 2015-12-17 | Disposition: A | Discharge status: Home / Self Care | Payer: BLUE CROSS/BLUE SHIELD | Attending: Family Medicine | Admitting: Family Medicine

## 2015-12-17 ENCOUNTER — Ambulatory Visit (INDEPENDENT_AMBULATORY_CARE_PROVIDER_SITE_OTHER): Payer: BLUE CROSS/BLUE SHIELD

## 2015-12-17 DIAGNOSIS — L03011 Cellulitis of right finger: Secondary | ICD-10-CM

## 2015-12-17 MED ORDER — CEPHALEXIN 250 MG PO CAPS: 250.0000 mg | ORAL_CAPSULE | Freq: Four times a day (QID) | ORAL | Status: DC

## 2015-12-17 NOTE — ED Provider Notes (Signed)
CSN: [id number]     Arrival date & time 12/17/15  1210 History   First MD Initiated Contact with Patient 12/17/15 1242     Chief Complaint  Patient presents with  . Hand Pain   (Consider location/radiation/quality/duration/timing/severity/associated sxs/prior Treatment) HPI History obtained from patient: Location:Left index finger   Patient states that several days ago she started having swelling in her left index finger. She was able to express a little bit' from the wound. States that she does bite her fingernails at times. Recently he was seen in the emergency department for fever workup was essentially negative but patient states that she still feels as though she then going on in her body. He is also complaining of left knee pain. She denies any recent injury. Her pain score is approximately 3. She states that the pain in her left finger radiates into her left arm. There is a family history of thyroid cancer.    Past Medical History  Diagnosis Date  . No pertinent past medical history   . Allergic rhinitis    Past Surgical History  Procedure Laterality Date  . Cesarean section     Family History  Problem Relation Age of Onset  . Other Neg Hx   . Throat cancer Mother   . Osteoarthritis Mother    Social History  Substance Use Topics  . Smoking status: Never Smoker   . Smokeless tobacco: None  . Alcohol Use: No   OB History    Gravida Para Term Preterm AB TAB SAB Ectopic Multiple Living   3 3 3  0 0 0 0 0 0 3     Review of Systems  Denies: HEADACHE, NAUSEA, ABDOMINAL PAIN, CHEST PAIN, CONGESTION, DYSURIA, SHORTNESS OF BREATH  Allergies  Latex and Contrast media  Home Medications   Prior to Admission medications   Medication Sig Start Date End Date Taking? Authorizing Provider  acetaminophen (TYLENOL) 500 MG tablet Take 1,000 mg by mouth every 6 (six) hours as needed for moderate pain or headache.    Historical Provider, MD   Meds Ordered and Administered this Visit   Medications - No data to display  BP 133/58 mmHg  Pulse 60  Temp(Src) 97.5 F (36.4 C) (Oral)  Resp 16  SpO2 100%  LMP 02/01/2012 No data found.   Physical Exam NURSES NOTES AND VITAL SIGNS REVIEWED. CONSTITUTIONAL: Well developed, well nourished, no acute distress HEENT: normocephalic, atraumatic EYES: Conjunctiva normal NECK:normal ROM, supple, no adenopathy PULMONARY:No respiratory distress, normal effort ABDOMINAL: Soft, ND, NT BS+, No CVAT MUSCULOSKELETAL: Normal ROM of all extremities, left index finger there is a small cuticle infection noted. Left knee is minimally tender to palpation there is a small effusion palpable. There is some crepitus noted. SKIN: warm and dry without rash PSYCHIATRIC: Mood and affect, behavior are normal  ED Course  Procedures (including critical care time)  Labs Review Labs Reviewed - No data to display  Imaging Review No results found.   Visual Acuity Review  Right Eye Distance:   Left Eye Distance:   Bilateral Distance:    Right Eye Near:   Left Eye Near:    Bilateral Near:         MDM   1. Paronychia of finger of right hand     Patient is reassured that there are no issues that require transfer to higher level of care at this time or additional tests. Patient is advised to continue home symptomatic treatment. Patient is advised that if there  are new or worsening symptoms to attend the emergency department, contact primary care provider, or return to UC. Instructions of care provided discharged home in stable condition.    THIS NOTE WAS GENERATED USING A VOICE RECOGNITION SOFTWARE PROGRAM. ALL REASONABLE EFFORTS  WERE MADE TO PROOFREAD THIS DOCUMENT FOR ACCURACY.  I have verbally reviewed the discharge instructions with the patient. A printed AVS was given to the patient.  All questions were answered prior to discharge.      Konrad Felix, [place] 12/17/15 2000

## 2015-12-17 NOTE — ED Notes (Signed)
Pt d/c by Frank P, PA 

## 2015-12-17 NOTE — Discharge Instructions (Signed)
Paronychia  °Paronychia is an infection of the skin. It happens near a fingernail or toenail. It may cause pain and swelling around the nail. Usually, it is not serious and it clears up with treatment. °HOME CARE °· Soak the fingers or toes in warm water as told by your doctor. You may be told to do this for 20 minutes, 2-3 times a day. °· Keep the area dry when you are not soaking it. °· Take medicines only as told by your doctor. °· If you were given an antibiotic medicine, finish all of it even if you start to feel better. °· Keep the affected area clean. °· Do not try to drain a fluid-filled bump yourself. °· Wear rubber gloves when putting your hands in water. °· Wear gloves if your hands might touch cleaners or chemicals. °· Follow your doctor's instructions about: °¨ Wound care. °¨ Bandage (dressing) changes and removal. °GET HELP IF: °· Your symptoms get worse or do not improve. °· You have a fever or chills. °· You have redness spreading from the affected area. °· You have more fluid, blood, or pus coming from the affected area. °· Your finger or knuckle is swollen or is hard to move. °  °This information is not intended to replace advice given to you by your health care provider. Make sure you discuss any questions you have with your health care provider. °  °Document Released: 05/16/2009 Document Revised: 10/12/2014 Document Reviewed: 05/05/2014 °Elsevier Interactive Patient Education ©2016 Elsevier Inc. ° °

## 2015-12-17 NOTE — ED Notes (Signed)
C/o right index finger pain/swelling onset x6 days... Denies inj/trauma Reports she was also seen at [facility] ED for fever... Feeling somewhat better A&O x4... NAD

## 2016-01-05 DIAGNOSIS — R202 Paresthesia of skin: Secondary | ICD-10-CM | POA: Insufficient documentation

## 2016-01-05 DIAGNOSIS — J383 Other diseases of vocal cords: Secondary | ICD-10-CM | POA: Insufficient documentation

## 2016-01-05 DIAGNOSIS — F411 Generalized anxiety disorder: Secondary | ICD-10-CM | POA: Insufficient documentation

## 2016-01-05 DIAGNOSIS — G43009 Migraine without aura, not intractable, without status migrainosus: Secondary | ICD-10-CM | POA: Insufficient documentation

## 2016-01-05 DIAGNOSIS — M5416 Radiculopathy, lumbar region: Secondary | ICD-10-CM | POA: Insufficient documentation

## 2016-01-09 DIAGNOSIS — G44229 Chronic tension-type headache, not intractable: Secondary | ICD-10-CM | POA: Insufficient documentation

## 2016-01-09 DIAGNOSIS — R519 Headache, unspecified: Secondary | ICD-10-CM | POA: Insufficient documentation

## 2016-01-30 ENCOUNTER — Encounter: Payer: Self-pay | Admitting: *Deleted

## 2016-01-31 ENCOUNTER — Ambulatory Visit
Admission: RE | Admit: 2016-01-31 | Payer: BLUE CROSS/BLUE SHIELD | Source: Ambulatory Visit | Admitting: Gastroenterology

## 2016-01-31 ENCOUNTER — Encounter: Admission: RE | Payer: Self-pay | Source: Ambulatory Visit

## 2016-01-31 HISTORY — DX: Unspecified osteoarthritis, unspecified site: M19.90

## 2016-01-31 SURGERY — COLONOSCOPY WITH PROPOFOL
Anesthesia: General

## 2016-12-17 ENCOUNTER — Ambulatory Visit: Payer: BLUE CROSS/BLUE SHIELD | Admitting: Anesthesiology

## 2016-12-17 ENCOUNTER — Ambulatory Visit
Admission: RE | Admit: 2016-12-17 | Discharge: 2016-12-17 | Disposition: A | Discharge status: Home / Self Care | Payer: BLUE CROSS/BLUE SHIELD | Source: Ambulatory Visit | Attending: Gastroenterology | Admitting: Gastroenterology

## 2016-12-17 ENCOUNTER — Encounter
Admission: RE | Disposition: A | Discharge status: Home / Self Care | Payer: Self-pay | Source: Ambulatory Visit | Attending: Gastroenterology

## 2016-12-17 DIAGNOSIS — Z1211 Encounter for screening for malignant neoplasm of colon: Secondary | ICD-10-CM | POA: Insufficient documentation

## 2016-12-17 DIAGNOSIS — D124 Benign neoplasm of descending colon: Secondary | ICD-10-CM | POA: Insufficient documentation

## 2016-12-17 DIAGNOSIS — K621 Rectal polyp: Secondary | ICD-10-CM | POA: Diagnosis not present

## 2016-12-17 DIAGNOSIS — Z91040 Latex allergy status: Secondary | ICD-10-CM | POA: Insufficient documentation

## 2016-12-17 DIAGNOSIS — K5730 Diverticulosis of large intestine without perforation or abscess without bleeding: Secondary | ICD-10-CM | POA: Diagnosis not present

## 2016-12-17 DIAGNOSIS — Z91041 Radiographic dye allergy status: Secondary | ICD-10-CM | POA: Insufficient documentation

## 2016-12-17 HISTORY — DX: Headache, unspecified: R51.9

## 2016-12-17 HISTORY — DX: Headache: R51

## 2016-12-17 HISTORY — PX: COLONOSCOPY WITH PROPOFOL: SHX5780

## 2016-12-17 SURGERY — COLONOSCOPY WITH PROPOFOL
Anesthesia: General

## 2016-12-17 MED ORDER — PROPOFOL 10 MG/ML IV BOLUS
INTRAVENOUS | Status: AC
  Filled 2016-12-17: qty 20

## 2016-12-17 MED ORDER — SODIUM CHLORIDE 0.9 % IV SOLN: INTRAVENOUS | Status: DC

## 2016-12-17 MED ORDER — PROPOFOL 10 MG/ML IV BOLUS
INTRAVENOUS | Status: DC | PRN
  Administered 2016-12-17: 100 mg via INTRAVENOUS

## 2016-12-17 MED ORDER — PROPOFOL 500 MG/50ML IV EMUL
INTRAVENOUS | Status: DC | PRN
  Administered 2016-12-17: 120 ug/kg/min via INTRAVENOUS

## 2016-12-17 MED ORDER — SODIUM CHLORIDE 0.9 % IV SOLN
INTRAVENOUS | Status: DC
  Administered 2016-12-17: 10:00:00 via INTRAVENOUS

## 2016-12-17 NOTE — Anesthesia Post-op Follow-up Note (Cosign Needed)
Anesthesia QCDR form completed.        

## 2016-12-17 NOTE — H&P (Signed)
Outpatient short stay form Pre-procedure 12/17/2016 10:18 AM Lollie Sails MD  Primary Physician: Dr. Hortencia Pilar  Reason for visit:  Colonoscopy  History of present illness:  Patient is a 56 year old female presenting today for her initial colonoscopy. She tolerated her prep well. She takes no aspirin or blood thinning agents. There is no family history of colon cancer colon polyps.    Current Facility-Administered Medications:  .  0.9 %  sodium chloride infusion, , Intravenous, Continuous, Lollie Sails, MD, Last Rate: 20 mL/hr at 12/17/16 1016 .  0.9 %  sodium chloride infusion, , Intravenous, Continuous, Lollie Sails, MD  Prescriptions Prior to Admission  Medication Sig Dispense Refill Last Dose  . acetaminophen (TYLENOL) 500 MG tablet Take 1,000 mg by mouth every 6 (six) hours as needed for moderate pain or headache.   Past Week at Unknown time  . nortriptyline (PAMELOR) 10 MG capsule Take 20 mg by mouth at bedtime.   Past Week at Unknown time  . cephALEXin (KEFLEX) 250 MG capsule Take 1 capsule (250 mg total) by mouth 4 (four) times daily. (Patient not taking: Reported on 12/17/2016) 28 capsule 0 Completed Course at Unknown time  . meloxicam (MOBIC) 15 MG tablet Take 15 mg by mouth daily.   Not Taking at Unknown time     Allergies  Allergen Reactions  . Latex Rash  . Contrast Media [Iodinated Diagnostic Agents] Hives    Pt states hives with previous injection of IV contarst, IV benadryl given 1 hour prior to injection     Past Medical History:  Diagnosis Date  . Allergic rhinitis   . Arthritis   . Headache    chronic migraines   . No pertinent past medical history     Review of systems:      Physical Exam    Heart and lungs: Regular rate and rhythm without rub or gallop, lungs are bilaterally clear.    HEENT: Normocephalic atraumatic eyes are anicteric    Other:     Pertinant exam for procedure: Soft nontender nondistended bowel sounds positive  normoactive.    Planned proceedures: Colonoscopy and indicated procedures. I have discussed the risks benefits and complications of procedures to include not limited to bleeding, infection, perforation and the risk of sedation and the patient wishes to proceed.    Lollie Sails, MD Gastroenterology 12/17/2016  10:18 AM

## 2016-12-17 NOTE — Transfer of Care (Signed)
Immediate Anesthesia Transfer of Care Note  Patient: Samantha Banks  Procedure(s) Performed: Procedure(s): COLONOSCOPY WITH PROPOFOL (N/A)  Patient Location: PACU and Endoscopy Unit  Anesthesia Type:General  Level of Consciousness: drowsy and patient cooperative  Airway & Oxygen Therapy: Patient Spontanous Breathing and Patient connected to nasal cannula oxygen  Post-op Assessment: Report given to RN and Post -op Vital signs reviewed and stable  Post vital signs: Reviewed and stable  Last Vitals:  Vitals:   12/17/16 0948 12/17/16 1052  BP: 133/73 99/60  Pulse: 70 73  Resp: 20 18  Temp: (!) 36.1 C     Last Pain:  Vitals:   12/17/16 0948  TempSrc: Tympanic  PainSc: 7          Complications: No apparent anesthesia complications

## 2016-12-17 NOTE — Anesthesia Preprocedure Evaluation (Addendum)
Anesthesia Evaluation  Patient identified by MRN, date of birth, ID band Patient awake    Reviewed: Allergy & Precautions, H&P , NPO status , Patient's Chart, lab work & pertinent test results  Airway Mallampati: III  TM Distance: >3 FB Neck ROM: full    Dental  (+) Poor Dentition, Chipped, Missing   Pulmonary neg pulmonary ROS, neg shortness of breath,           Cardiovascular Exercise Tolerance: Good negative cardio ROS       Neuro/Psych  Headaches, negative psych ROS   GI/Hepatic negative GI ROS, Neg liver ROS, neg GERD  ,  Endo/Other  negative endocrine ROS  Renal/GU negative Renal ROS  negative genitourinary   Musculoskeletal  (+) Arthritis ,   Abdominal   Peds  Hematology negative hematology ROS (+)   Anesthesia Other Findings Past Medical History: No date: Allergic rhinitis No date: Arthritis No date: Headache     Comment: chronic migraines  No date: No pertinent past medical history  Past Surgical History: No date: CESAREAN SECTION  BMI    Body Mass Index:  29.29 kg/m      Reproductive/Obstetrics negative OB ROS                             Anesthesia Physical Anesthesia Plan  ASA: II  Anesthesia Plan: General   Post-op Pain Management:    Induction: Intravenous  PONV Risk Score and Plan:   Airway Management Planned: Natural Airway and Nasal Cannula  Additional Equipment:   Intra-op Plan:   Post-operative Plan:   Informed Consent: I have reviewed the patients History and Physical, chart, labs and discussed the procedure including the risks, benefits and alternatives for the proposed anesthesia with the patient or authorized representative who has indicated his/her understanding and acceptance.   Dental Advisory Given  Plan Discussed with: Anesthesiologist, CRNA and Surgeon  Anesthesia Plan Comments: (Patient consented for risks of anesthesia including  but not limited to:  - adverse reactions to medications - damage to teeth, lips or other oral mucosa - sore throat or hoarseness - Damage to heart, brain, lungs or loss of life  Patient voiced understanding.)       Anesthesia Quick Evaluation

## 2016-12-17 NOTE — Anesthesia Postprocedure Evaluation (Signed)
Anesthesia Post Note  Patient: Samantha Banks  Procedure(s) Performed: Procedure(s) (LRB): COLONOSCOPY WITH PROPOFOL (N/A)  Patient location during evaluation: Endoscopy Anesthesia Type: General Level of consciousness: awake and alert Pain management: pain level controlled Vital Signs Assessment: post-procedure vital signs reviewed and stable Respiratory status: spontaneous breathing, nonlabored ventilation and respiratory function stable Cardiovascular status: blood pressure returned to baseline and stable Postop Assessment: no signs of nausea or vomiting Anesthetic complications: no     Last Vitals:  Vitals:   12/17/16 1130 12/17/16 1153  BP: 128/69 122/69  Pulse: 79   Resp: 11   Temp:      Last Pain:  Vitals:   12/17/16 0948  TempSrc: Tympanic  PainSc: 7                  Precious Haws Piscitello

## 2016-12-17 NOTE — Op Note (Signed)
[facility] Gastroenterology Patient Name: Samantha Banks Procedure Date: 12/17/2016 10:14 AM MRN: [id number] Account #: [id number] Date of Birth: Dec 30, 1960 Admit Type: Outpatient Age: 56 Room: [facility] ENDO ROOM 3 Gender: Female Note Status: Finalized Procedure:            Colonoscopy Indications:          Screening for colorectal malignant neoplasm, This is                        the patient's first colonoscopy Providers:            Lollie Sails, MD Referring MD:         Kerin Perna MD, MD (Referring MD) Medicines:            Monitored Anesthesia Care Complications:        No immediate complications. Procedure:            Pre-Anesthesia Assessment:                       - ASA Grade Assessment: II - A patient with mild                        systemic disease.                       After obtaining informed consent, the colonoscope was                        passed under direct vision. Throughout the procedure,                        the patient's blood pressure, pulse, and oxygen                        saturations were monitored continuously. The                        Colonoscope was introduced through the anus and                        advanced to the the cecum, identified by appendiceal                        orifice and ileocecal valve. The colonoscopy was                        performed without difficulty. The patient tolerated the                        procedure well. The quality of the bowel preparation                        was good. Findings:      A 2 mm polyp was found in the rectum. The polyp was sessile. The polyp       was removed with a cold biopsy forceps. Resection and retrieval were       complete.      A 6 mm polyp was found in the rectum. The polyp was sessile. Polypectomy       was attempted, initially using a lift and cut technique with a  hot       snare. Polyp resection was incomplete with this device. This       intervention  then required a different device and polypectomy technique.       The polyp was removed with a cold biopsy forceps. Resection and       retrieval were complete.      A 4 mm polyp was found in the descending colon. The polyp was sessile.       The polyp was removed with a cold biopsy forceps. Resection and       retrieval were complete.      A few medium-mouthed diverticula were found in the sigmoid colon,       descending colon, transverse colon and ascending colon.      The retroflexed view of the distal rectum and anal verge was normal and       showed no anal or rectal abnormalities.      The digital rectal exam was normal. Impression:           - One 2 mm polyp in the rectum, removed with a cold                        biopsy forceps. Resected and retrieved.                       - One 6 mm polyp in the rectum, removed with a cold                        biopsy forceps. Resected and retrieved.                       - One 4 mm polyp in the descending colon, removed with                        a cold biopsy forceps. Resected and retrieved.                       - Diverticulosis in the sigmoid colon, in the                        descending colon, in the transverse colon and in the                        ascending colon.                       - The distal rectum and anal verge are normal on                        retroflexion view. Recommendation:       - Discharge patient to home.                       - Telephone GI clinic for pathology results in 1 week. Procedure Code(s):    --- Professional ---                       [id number], Colonoscopy, flexible; with biopsy, single or                        multiple Diagnosis Code(s):    ---  Professional ---                       Z12.11, Encounter for screening for malignant neoplasm                        of colon                       K62.1, Rectal polyp                       D12.4, Benign neoplasm of descending colon                        K57.30, Diverticulosis of large intestine without                        perforation or abscess without bleeding CPT copyright 2016 American Medical Association. All rights reserved. The codes documented in this report are preliminary and upon coder review may  be revised to meet current compliance requirements. Lollie Sails, MD 12/17/2016 10:53:21 AM This report has been signed electronically. Number of Addenda: 0 Note Initiated On: 12/17/2016 10:14 AM Scope Withdrawal Time: 0 hours 5 minutes 1 second  Total Procedure Duration: 0 hours 22 minutes 2 seconds       [facility]

## 2016-12-18 ENCOUNTER — Encounter: Payer: Self-pay | Admitting: Gastroenterology

## 2016-12-19 LAB — SURGICAL PATHOLOGY

## 2017-01-17 ENCOUNTER — Encounter (HOSPITAL_COMMUNITY): Payer: Self-pay | Admitting: Emergency Medicine

## 2017-01-17 DIAGNOSIS — R0602 Shortness of breath: Secondary | ICD-10-CM | POA: Insufficient documentation

## 2017-01-17 DIAGNOSIS — Z91040 Latex allergy status: Secondary | ICD-10-CM | POA: Insufficient documentation

## 2017-01-17 DIAGNOSIS — M79605 Pain in left leg: Secondary | ICD-10-CM | POA: Insufficient documentation

## 2017-01-17 DIAGNOSIS — Z79899 Other long term (current) drug therapy: Secondary | ICD-10-CM | POA: Insufficient documentation

## 2017-01-17 NOTE — ED Triage Notes (Signed)
Pt is c/o left leg pain  States the pain is a hot burning pain  Pt states she has also felt short of breath that is worse when she bends over

## 2017-01-18 ENCOUNTER — Other Ambulatory Visit: Payer: Self-pay

## 2017-01-18 ENCOUNTER — Emergency Department (HOSPITAL_COMMUNITY): Payer: BLUE CROSS/BLUE SHIELD

## 2017-01-18 ENCOUNTER — Emergency Department (HOSPITAL_COMMUNITY)
Admission: EM | Admit: 2017-01-18 | Discharge: 2017-01-18 | Disposition: A | Discharge status: Home / Self Care | Payer: BLUE CROSS/BLUE SHIELD | Attending: Emergency Medicine | Admitting: Emergency Medicine

## 2017-01-18 DIAGNOSIS — M79605 Pain in left leg: Secondary | ICD-10-CM

## 2017-01-18 DIAGNOSIS — R0602 Shortness of breath: Secondary | ICD-10-CM

## 2017-01-18 LAB — CBC WITH DIFFERENTIAL/PLATELET
BASOS ABS: 0 10*3/uL (ref 0.0–0.1)
BASOS PCT: 1 %
EOS ABS: 0.2 10*3/uL (ref 0.0–0.7)
EOS PCT: 2 %
HCT: 40.2 % (ref 36.0–46.0)
Hemoglobin: 13.1 g/dL (ref 12.0–15.0)
LYMPHS ABS: 2 10*3/uL (ref 0.7–4.0)
LYMPHS PCT: 24 %
MCH: 29.1 pg (ref 26.0–34.0)
MCHC: 32.6 g/dL (ref 30.0–36.0)
MCV: 89.3 fL (ref 78.0–100.0)
MONOS PCT: 7 %
Monocytes Absolute: 0.6 10*3/uL (ref 0.1–1.0)
Neutro Abs: 5.5 10*3/uL (ref 1.7–7.7)
Neutrophils Relative %: 66 %
PLATELETS: 225 10*3/uL (ref 150–400)
RBC: 4.5 MIL/uL (ref 3.87–5.11)
RDW: 14 % (ref 11.5–15.5)
WBC: 8.4 10*3/uL (ref 4.0–10.5)

## 2017-01-18 LAB — BASIC METABOLIC PANEL
Anion gap: 9 (ref 5–15)
BUN: 9 mg/dL (ref 6–20)
CALCIUM: 9 mg/dL (ref 8.9–10.3)
CO2: 29 mmol/L (ref 22–32)
Chloride: 104 mmol/L (ref 101–111)
Creatinine, Ser: 0.6 mg/dL (ref 0.44–1.00)
GLUCOSE: 108 mg/dL — AB (ref 65–99)
Potassium: 3.8 mmol/L (ref 3.5–5.1)
SODIUM: 142 mmol/L (ref 135–145)

## 2017-01-18 LAB — I-STAT TROPONIN, ED: TROPONIN I, POC: 0 ng/mL (ref 0.00–0.08)

## 2017-01-18 LAB — D-DIMER, QUANTITATIVE (NOT AT ARMC)

## 2017-01-18 MED ORDER — NAPROXEN 500 MG PO TABS: 500.0000 mg | ORAL_TABLET | Freq: Two times a day (BID) | ORAL | 0 refills | Status: DC

## 2017-01-18 MED ORDER — FAMOTIDINE 20 MG PO TABS: 20.0000 mg | ORAL_TABLET | Freq: Two times a day (BID) | ORAL | 0 refills | Status: DC

## 2017-01-18 NOTE — Discharge Instructions (Signed)
Please read and follow all provided instructions.  Your diagnoses today include:  1. Shortness of breath   2. Pain of left lower extremity     Tests performed today include:  An EKG of your heart - normal heart rhythm  A chest x-ray - no infeciton  Cardiac enzymes - a blood test for heart muscle damage was normal  D-dimer - screening test for blood clot was negative  Blood counts and electrolytes  Vital signs. See below for your results today.   Medications prescribed:   Naproxen - anti-inflammatory pain medication  Do not exceed 500mg  naproxen every 12 hours, take with food  You have been prescribed an anti-inflammatory medication or NSAID. Take with food. Take smallest effective dose for the shortest duration needed for your pain. Stop taking if you experience stomach pain or vomiting.    Pepcid (famotidine) - antihistamine  You can find this medication over-the-counter.   DO NOT exceed:   20mg  Pepcid every 12 hours  Take any prescribed medications only as directed.  Follow-up instructions: Please follow-up with your primary care provider in the next 1 week for further evaluation of your symptoms.   Return instructions:  SEEK IMMEDIATE MEDICAL ATTENTION IF:  You have severe chest pain, especially if the pain is crushing or pressure-like and spreads to the arms, back, neck, or jaw, or if you have sweating, nausea (feeling sick to your stomach), or shortness of breath. THIS IS AN EMERGENCY. Don't wait to see if the pain will go away. Get medical help at once. Call 911 or 0 (operator). DO NOT drive yourself to the hospital.   Your chest pain gets worse and does not go away with rest.   You have an attack of chest pain lasting longer than usual, despite rest and treatment with the medications your caregiver has prescribed.   You wake from sleep with chest pain or shortness of breath.  You feel dizzy or faint.  You have chest pain not typical of your usual pain  for which you originally saw your caregiver.   You have any other emergent concerns regarding your health.  Additional Information: Chest pain and shortness of breath can come from many different causes. Your caregiver has diagnosed you as having chest pain that is not specific for one problem, but does not require admission.  You are at low risk for an acute heart condition or other serious illness.   Your vital signs today were: BP (!) 150/84 (BP Location: Right Arm)    Pulse 62    Temp 98 F (36.7 C) (Oral)    Resp 18    LMP 02/01/2012    SpO2 100%  If your blood pressure (BP) was elevated above 135/85 this visit, please have this repeated by your doctor within one month. --------------

## 2017-01-18 NOTE — ED Provider Notes (Signed)
[facility] DEPT Provider Note   CSN: [id number] Arrival date & time: 01/17/17  2152     History   Chief Complaint Chief Complaint  Patient presents with  . Leg Pain    HPI Sam[facility] Banks is a 56 y.o. female.  Patient presents with complaint of left thigh and knee pain/swelling that has been ongoing but acutely worse yesterday. No injuries or falls. She has had associated shortness of breath "like I can get my air out" and a feeling of weight on her chest. This has also been going on 'for about a year' but seems to have gotten worse as well. 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 She states that the symptoms are worse when she bends over. She has had occasional nonproductive cough. No fever. No hemoptysis. No abdominal pain, nausea, vomiting, diarrhea. No treatments prior to arrival. Patient denies risk factors for pulmonary embolism including: unilateral leg swelling, history of DVT/PE/other blood clots, use of exogenous hormones, recent immobilizations, recent surgery, recent travel (>4hr segment), malignancy (does have family history). Denies HTN, high cholesterol, DM, smoking.        Past Medical History:  Diagnosis Date  . Allergic rhinitis   . Arthritis   . Headache    chronic migraines   . No pertinent past medical history     Patient Active Problem List   Diagnosis Date Noted  . Dyspnea 09/20/2014  . Chest wall pain 09/20/2014    Past Surgical History:  Procedure Laterality Date  . CESAREAN SECTION    . COLONOSCOPY WITH PROPOFOL N/A 12/17/2016   Procedure: COLONOSCOPY WITH PROPOFOL;  Surgeon: Lollie Sails, MD;  Location: [facility] ENDOSCOPY;  Service: Endoscopy;  Laterality: N/A;    OB History    Gravida Para Term Preterm AB Living   3 3 3  0 0 3   SAB TAB Ectopic Multiple Live Births   0 0 0 0         Home Medications    Prior to Admission medications   Medication Sig Start Date End Date Taking? Authorizing Provider  acetaminophen  (TYLENOL) 500 MG tablet Take 1,000 mg by mouth every 6 (six) hours as needed for moderate pain or headache.    [provider]  cephALEXin (KEFLEX) 250 MG capsule Take 1 capsule (250 mg total) by mouth 4 (four) times daily. Patient not taking: Reported on 12/17/2016 12/17/15   Konrad Felix, PA  meloxicam (MOBIC) 15 MG tablet Take 15 mg by mouth daily.    [provider]  nortriptyline (PAMELOR) 10 MG capsule Take 20 mg by mouth at bedtime.    [provider]    Family History Family History  Problem Relation Age of Onset  . Throat cancer Mother   . Osteoarthritis Mother   . Hypertension Father   . Diabetes Mellitus II Sister   . Asthma Sister   . Other Neg Hx     Social History Social History  Substance Use Topics  . Smoking status: Never Smoker  . Smokeless tobacco: Never Used  . Alcohol use No     Allergies   Latex and Contrast media [iodinated diagnostic agents]   Review of Systems Review of Systems  Constitutional: Negative for diaphoresis and fever.  Eyes: Negative for redness.  Respiratory: Positive for shortness of breath. Negative for cough.   Cardiovascular: Positive for chest pain (pressure) and leg swelling. Negative for palpitations.  Gastrointestinal: Negative for abdominal pain, nausea and vomiting.  Genitourinary: Negative for dysuria.  Musculoskeletal: Positive for arthralgias and myalgias. Negative for back pain, joint swelling and neck pain.  Skin: Negative for rash.  Neurological: Negative for syncope and light-headedness.  Psychiatric/Behavioral: The patient is not nervous/anxious.      Physical Exam Updated Vital Signs BP (!) 150/84 (BP Location: Right Arm)   Pulse 62   Temp 98 F (36.7 C) (Oral)   Resp 18   LMP 02/01/2012   SpO2 100%   Physical Exam  Constitutional: She appears well-developed and well-nourished.  HENT:  Head: Normocephalic and atraumatic.  Mouth/Throat: Oropharynx is clear and moist and  mucous membranes are normal. Mucous membranes are not dry.  Eyes: Conjunctivae are normal. Right eye exhibits no discharge. Left eye exhibits no discharge.  Neck: Trachea normal and normal range of motion. Neck supple. Normal carotid pulses and no JVD present. No muscular tenderness present. Carotid bruit is not present. No tracheal deviation present.  Cardiovascular: Normal rate, regular rhythm, S1 normal, S2 normal, normal heart sounds and intact distal pulses.  Exam reveals no decreased pulses.   No murmur heard. Pulses:      Dorsalis pedis pulses are 2+ on the right side, and 2+ on the left side.  Pulmonary/Chest: Effort normal and breath sounds normal. No respiratory distress. She has no wheezes. She exhibits no tenderness.  Abdominal: Soft. Normal aorta and bowel sounds are normal. There is no tenderness. There is no rebound and no guarding.  Musculoskeletal: Normal range of motion. She exhibits no edema.  No gross swelling. Generalized tenderness of L knee and thigh. Slightly warm to touch. No cellulitis. No calf tenderness. Full ROM L hip and knee.   Neurological: She is alert.  Skin: Skin is warm and dry. She is not diaphoretic. No cyanosis. No pallor.  Psychiatric: She has a normal mood and affect.  Nursing note and vitals reviewed.    ED Treatments / Results  Labs (all labs ordered are listed, but only abnormal results are displayed) Labs Reviewed  BASIC METABOLIC PANEL - Abnormal; Notable for the following:       Result Value   Glucose, Bld 108 (*)    All other components within normal limits  CBC WITH DIFFERENTIAL/PLATELET  D-DIMER, QUANTITATIVE (NOT AT [facility])  I-STAT TROPONIN, ED    EKG  EKG Interpretation  Date/Time:  Friday January 18 2017 05:28:17 EDT Ventricular Rate:  63 PR Interval:    QRS Duration: 84 QT Interval:  416 QTC Calculation: 426 R Axis:   58 Text Interpretation:  Sinus rhythm Confirmed by Pryor Curia [phone]) on 01/18/2017 5:36:23 AM Also  confirmed by Ward, Cyril Mourning [phone]), editor Drema Pry [phone])  on 01/18/2017 7:23:11 AM       Radiology Dg Chest 2 View  Result Date: 01/18/2017 CLINICAL DATA:  Acute onset of bilateral leg swelling. Initial encounter. EXAM: CHEST  2 VIEW COMPARISON:  Chest radiograph performed 12/13/2015 FINDINGS: The lungs are well-aerated and clear. There is no evidence of focal opacification, pleural effusion or pneumothorax. The heart is normal in size; the mediastinal contour is within normal limits. No acute osseous abnormalities are seen. IMPRESSION: No acute cardiopulmonary process seen. Electronically Signed   By: Garald Balding M.D.   On: 01/18/2017 04:45    Procedures Procedures (including critical care time)  Medications Ordered in ED Medications - No data to display   Initial Impression / Assessment and Plan / ED Course  I have reviewed the triage vital signs and the nursing notes.  Pertinent labs & imaging results that were available during my care of the patient were reviewed by me and considered in my medical decision making (see chart for details).     Patient seen and examined. Work-up initiated.   Vital signs reviewed and are as follows: BP (!) 150/84 (BP Location: Right Arm)   Pulse 62   Temp 98 F (36.7 C) (Oral)   Resp 18   LMP 02/01/2012   SpO2 100%   Vitals and EKG reviewed. Patient has some difficulty verbalizing exactly what she is feeling, however it seems that she has some chronic leg pain, subjective dyspnea that is worse. I feels she is low-risk for PE, but feel given this constellation of symptoms she will need evaluated for DVT/PE. Feel she is even lower risk for ACS with atypical features. Work-up pending.   8:38 AM patient workup is reassuring and exam is stable. I discussed results with patient at bedside. Discussed unlikely that she is having a heart problem or blood clot.  Will place on Naprosyn and Pepcid for symptomatic relief. Encouraged PCP  follow-up, given chronic nature of symptoms, for further evaluation and workup.  Encouraged patient to return to the emergency department with worsening symptoms, lightheadedness, worsening shortness of breath, worsening chest pain, difficulty walking, redness or worsening swelling of her lower extremities.   Final Clinical Impressions(s) / ED Diagnoses   Final diagnoses:  Shortness of breath  Pain of left lower extremity   Patient with acute on chronic shortness of breath, chest pressure, left lower extremity pain and swelling. D-dimer today was negative for clot. EKG was normal. Troponin negative 1. Patient has been in the emergency department for over 10 hours without change in symptoms. Vital signs remained stable with mild hypertension. Do not suspect fracture. No large effusion or suggestion of septic arthritis on exam.  New Prescriptions New Prescriptions   FAMOTIDINE (PEPCID) 20 MG TABLET    Take 1 tablet (20 mg total) by mouth 2 (two) times daily.   NAPROXEN (NAPROSYN) 500 MG TABLET    Take 1 tablet (500 mg total) by mouth 2 (two) times daily.     Carlisle Cater, [name] 01/18/17 0840    Ward, Delice Bison, DO 01/26/17 [phone]

## 2017-01-22 ENCOUNTER — Encounter (INDEPENDENT_AMBULATORY_CARE_PROVIDER_SITE_OTHER): Payer: Self-pay | Admitting: Vascular Surgery

## 2017-01-22 ENCOUNTER — Ambulatory Visit (INDEPENDENT_AMBULATORY_CARE_PROVIDER_SITE_OTHER): Payer: BLUE CROSS/BLUE SHIELD | Admitting: Vascular Surgery

## 2017-01-22 VITALS — BP 137/81 | HR 67 | Resp 16 | Ht 59.0 in | Wt 157.0 lb

## 2017-01-22 DIAGNOSIS — M79604 Pain in right leg: Secondary | ICD-10-CM | POA: Diagnosis not present

## 2017-01-22 DIAGNOSIS — M79605 Pain in left leg: Secondary | ICD-10-CM

## 2017-01-22 DIAGNOSIS — M1990 Unspecified osteoarthritis, unspecified site: Secondary | ICD-10-CM | POA: Diagnosis not present

## 2017-01-22 DIAGNOSIS — M7989 Other specified soft tissue disorders: Secondary | ICD-10-CM | POA: Diagnosis not present

## 2017-01-22 DIAGNOSIS — M1712 Unilateral primary osteoarthritis, left knee: Secondary | ICD-10-CM | POA: Insufficient documentation

## 2017-01-22 DIAGNOSIS — M79609 Pain in unspecified limb: Secondary | ICD-10-CM | POA: Insufficient documentation

## 2017-01-22 NOTE — Assessment & Plan Note (Signed)
Could certainly be contributing lower extremity symptoms

## 2017-01-22 NOTE — Progress Notes (Signed)
Patient ID: Samantha Banks, female   DOB: Nov 14, 1960, 56 y.o.   MRN: [id number]  Chief Complaint  Patient presents with  . Leg Pain    HPI Samantha Banks is a 56 y.o. female.  Patient present for evaluation of Leg pain. She complains of swelling daily. She complains of cramping and tightness in her calves and thighs particularly with activity. She reports left lower extremity to be the more severely affected of the 2 legs. She denies previous DVT, trauma, or injury. She reports no clear inciting event or causative factor that started the symptoms. She has had steady progression of her symptoms over several months. Nothing has really seemed to make it much better.  Current Outpatient Prescriptions  Medication Sig Dispense Refill  . acetaminophen (TYLENOL) 500 MG tablet Take 1,000 mg by mouth every 6 (six) hours as needed for moderate pain or headache.    . famotidine (PEPCID) 20 MG tablet Take 1 tablet (20 mg total) by mouth 2 (two) times daily. 30 tablet 0  . naproxen (NAPROSYN) 500 MG tablet Take 1 tablet (500 mg total) by mouth 2 (two) times daily. 15 tablet 0  . nortriptyline (PAMELOR) 10 MG capsule Take 20 mg by mouth at bedtime.     No current facility-administered medications for this visit.      Past Medical History:  Diagnosis Date  . Allergic rhinitis   . Arthritis   . Headache    chronic migraines   . No pertinent past medical history     Past Surgical History:  Procedure Laterality Date  . CESAREAN SECTION    . COLONOSCOPY WITH PROPOFOL N/A 12/17/2016   Procedure: COLONOSCOPY WITH PROPOFOL;  Surgeon: Samantha Sails, Samantha Banks;  Location: [facility];  Service: Endoscopy;  Laterality: N/A;    Family History  Problem Relation Age of Onset  . Throat cancer Mother   . Osteoarthritis Mother   . Hypertension Father   . Diabetes Mellitus II Sister   . Asthma Sister   . Other Neg Hx     Social History Social History  Substance Use Topics  . Smoking  status: Never Smoker  . Smokeless tobacco: Never Used  . Alcohol use No  No IVDU  Allergies  Allergen Reactions  . Latex Rash  . Contrast Media [Iodinated Diagnostic Agents] Hives    Pt states hives with previous injection of IV contarst, IV benadryl given 1 hour prior to injection        REVIEW OF SYSTEMS (Negative unless checked)  Constitutional: '[]' Weight loss  '[]' Fever  '[]' Chills Cardiac: '[]' Chest pain   '[]' Chest pressure   '[]' Palpitations   '[]' Shortness of breath when laying flat   '[]' Shortness of breath at rest   '[]' Shortness of breath with exertion. Vascular:  '[x]' Pain in legs with walking   '[x]' Pain in legs at rest   '[]' Pain in legs when laying flat   '[]' Claudication   '[]' Pain in feet when walking  '[]' Pain in feet at rest  '[]' Pain in feet when laying flat   '[]' History of DVT   '[]' Phlebitis   '[x]' Swelling in legs   '[]' Varicose veins   '[]' Non-healing ulcers Pulmonary:   '[]' Uses home oxygen   '[]' Productive cough   '[]' Hemoptysis   '[]' Wheeze  '[]' COPD   '[]' Asthma Neurologic:  '[]' Dizziness  '[]' Blackouts   '[]' Seizures   '[]' History of stroke   '[]' History of TIA  '[]' Aphasia   '[]' Temporary blindness   '[]' Dysphagia   '[]' Weakness or numbness in arms   '[]' Weakness or  numbness in legs Musculoskeletal:  '[x]' Arthritis   '[]' Joint swelling   '[]' Joint pain   '[]' Low back pain Hematologic:  '[]' Easy bruising  '[]' Easy bleeding   '[]' Hypercoagulable state   '[]' Anemic  '[]' Hepatitis  Gastrointestinal:  '[]' Blood in stool   '[]' Vomiting blood  '[]' Gastroesophageal reflux/heartburn   '[]' Difficulty swallowing   Genitourinary:  '[]' Chronic kidney disease   '[]' Difficult urination  '[]' Frequent urination  '[]' Burning with urination   '[]' Blood in urine Skin:  '[]' Rashes   '[]' Ulcers   '[]' Wounds Psychological:  '[]' History of anxiety   '[]'  History of major depression.  Physical Exam BP 137/81   Pulse 67   Resp 16   Ht '4\' 11"'  (1.499 m)   Wt 157 lb (71.2 kg)   LMP 02/01/2012   BMI 31.71 kg/m   Gen:  WD/WN, NAD Head: [place]/AT, No temporalis wasting.  Ear/Nose/Throat: Hearing  grossly intact, nares w/o erythema or drainage, oropharynx w/o Erythema/Exudate Eyes: Sclera non-icteric, conjunctiva clear Neck: Trachea midline.  No JVD.  Pulmonary:  Good air movement, no use of accessory muscles.  Cardiac: RRR, normal S1, S2. Vascular:  Vessel Right Left  Radial Palpable Palpable                          PT 1+ Palpable Not Palpable  DP 1+ Palpable 1+ Palpable   Gastrointestinal: soft, non-tender/non-distended Musculoskeletal: M/S 5/5 throughout.  Extremities without ischemic changes.  No deformity or atrophy. 1+ right lower extremity edema, 1-2+ left lower extremity edema. Varicosities scattered  Neurologic:  Sensation grossly intact in extremities.  Symmetrical.  Speech is fluent. Motor exam as listed above. Psychiatric: Judgment intact, Mood & affect appropriate for pt's clinical situation. Dermatologic: No rashes or ulcers noted.  No cellulitis or open wounds.  Radiology Dg Chest 2 View  Result Date: 01/18/2017 CLINICAL DATA:  Acute onset of bilateral leg swelling. Initial encounter. EXAM: CHEST  2 VIEW COMPARISON:  Chest radiograph performed 12/13/2015 FINDINGS: The lungs are well-aerated and clear. There is no evidence of focal opacification, pleural effusion or pneumothorax. The heart is normal in size; the mediastinal contour is within normal limits. No acute osseous abnormalities are seen. IMPRESSION: No acute cardiopulmonary process seen. Electronically Signed   By: Samantha Banks M.D.   On: 01/18/2017 04:45    Labs Recent Results (from the past 2160 hour(s))  Surgical pathology     Status: None   Collection Time: 12/17/16 10:27 AM  Result Value Ref Range   SURGICAL PATHOLOGY      Surgical Pathology CASE: [facility] PATIENT: [facility] Surgical Pathology Report     SPECIMEN SUBMITTED: A. Rectum polyp; cold snare B. Rectum polyp; cbx C. Colon polyp, descending; cbx  CLINICAL HISTORY: None provided  PRE-OPERATIVE  DIAGNOSIS: Screening  POST-OPERATIVE DIAGNOSIS: Polyps, diverticulosis     DIAGNOSIS: A. RECTUM POLYP; COLD SNARE: - HYPERPLASTIC POLYP, 3 FRAGMENTS. - NEGATIVE FOR DYSPLASIA AND MALIGNANCY.  B. RECTUM POLYP; COLD BIOPSY: - HYPERPLASTIC POLYP. - NEGATIVE FOR DYSPLASIA AND MALIGNANCY.  C. COLON POLYP, DESCENDING; COLD BIOPSY: - TUBULAR ADENOMA. - NEGATIVE FOR HIGH-GRADE DYSPLASIA AND MALIGNANCY.   GROSS DESCRIPTION: A. Labeled: cold snare rectum colon polyp Tissue fragment(s): 3 Size: less than 0.1-0.15 cm Description: pink to tan fragments  Entirely submitted in 1 cassette(s).   B. Labeled: C BX rectum colon polyp Tissue fragment(s): 1 Size: 0.15 cm Description: tan fragment  Entirely submitted  in one cassette(s).   C. Labeled: C BX descending colon polyp Tissue fragment(s): 2 Size: 0.1  and 0.4 cm Description: tan fragments  Entirely submitted in 1 cassette(s).  Final Diagnosis performed by Bryan Lemma, Samantha Banks.  Electronically signed 12/19/2016 7:41:59PM    The electronic signature indicates that the named Attending Pathologist has evaluated the specimen  Technical component performed at [facility], [address], [place], [place] [address] Lab: [phone] Dir: Darrick Penna. Evette Doffing, Sam[facility] Banks  Professional component performed at [facility], [facility], [place], [place], [place] [address] Lab: [phone] Dir: Dellia Nims. Rubinas, Samantha Banks    CBC with Differential/Platelet     Status: None   Collection Time: 01/18/17  6:31 AM  Result Value Ref Range   WBC 8.4 4.0 - 10.5 K/uL   RBC 4.50 3.87 - 5.11 MIL/uL   Hemoglobin 13.1 12.0 - 15.0 g/dL   HCT 40.2 36.0 - 46.0 %   MCV 89.3 78.0 - 100.0 fL   MCH 29.1 26.0 - 34.0 pg   MCHC 32.6 30.0 - 36.0 g/dL   RDW 14.0 11.5 - 15.5 %   Platelets 225 150 - 400 K/uL   Neutrophils Relative % 66 %   Neutro Abs 5.5 1.7 - 7.7 K/uL   Lymphocytes Relative 24 %   Lymphs Abs 2.0 0.7 - 4.0 K/uL   Monocytes Relative  7 %   Monocytes Absolute 0.6 0.1 - 1.0 K/uL   Eosinophils Relative 2 %   Eosinophils Absolute 0.2 0.0 - 0.7 K/uL   Basophils Relative 1 %   Basophils Absolute 0.0 0.0 - 0.1 K/uL  Basic metabolic panel     Status: Abnormal   Collection Time: 01/18/17  6:31 AM  Result Value Ref Range   Sodium 142 135 - 145 mmol/L   Potassium 3.8 3.5 - 5.1 mmol/L   Chloride 104 101 - 111 mmol/L   CO2 29 22 - 32 mmol/L   Glucose, Bld 108 (H) 65 - 99 mg/dL   BUN 9 6 - 20 mg/dL   Creatinine, Ser 0.60 0.44 - 1.00 mg/dL   Calcium 9.0 8.9 - 10.3 mg/dL   GFR calc non Af Amer >60 >60 mL/min   GFR calc Af Amer >60 >60 mL/min    Comment: (NOTE) The eGFR has been calculated using the CKD EPI equation. This calculation has not been validated in all clinical situations. eGFR's persistently <60 mL/min signify possible Chronic Kidney Disease.    Anion gap 9 5 - 15  D-dimer, quantitative (not at [facility])     Status: None   Collection Time: 01/18/17  6:31 AM  Result Value Ref Range   D-Dimer, Quant <0.27 0.00 - 0.50 ug/mL-FEU    Comment: (NOTE) At the manufacturer cut-off of 0.50 ug/mL FEU, this assay has been documented to exclude PE with a sensitivity and negative predictive value of 97 to 99%.  At this time, this assay has not been approved by the FDA to exclude DVT/VTE. Results should be correlated with clinical presentation.   I-stat troponin, ED     Status: None   Collection Time: 01/18/17  6:42 AM  Result Value Ref Range   Troponin i, poc 0.00 0.00 - 0.08 ng/mL   Comment 3            Comment: Due to the release kinetics of cTnI, a negative result within the first hours of the onset of symptoms does not rule out myocardial infarction with certainty. If myocardial infarction is still suspected, repeat the test at appropriate intervals.     Assessment/Plan:  Arthritis Could certainly be contributing lower  extremity symptoms  Swelling of limb Although the cause is not entirely clear, vascular  disease will be evaluated for. Given the patient's symptoms, I recommended she wear compression stockings on a daily basis and try to elevate her legs more. We will see her back following her noninvasive studies.  Pain in limb  Recommend:  The patient has atypical pain symptoms for pure atherosclerotic disease. However, on physical exam there is evidence of mixed venous and arterial disease, given the diminished pulses and the edema associated with venous changes of the legs.  Noninvasive studies including ABI's and venous ultrasound of the legs will be obtained and the patient will follow up with me to review these studies.  The patient should continue walking and begin a more formal exercise program. The patient should continue his antiplatelet therapy and aggressive treatment of the lipid abnormalities.  The patient should begin wearing graduated compression socks 15-20 mmHg strength to control edema.      Leotis Pain 01/22/2017, 11:16 AM   This note was created with [facility] medical dictation system.  Any errors from dictation are unintentional.

## 2017-01-22 NOTE — Assessment & Plan Note (Signed)
Although the cause is not entirely clear, vascular disease will be evaluated for. Given the patient's symptoms, I recommended she wear compression stockings on a daily basis and try to elevate her legs more. We will see her back following her noninvasive studies.

## 2017-01-22 NOTE — Assessment & Plan Note (Signed)
>>  ASSESSMENT AND PLAN FOR ARTHRITIS WRITTEN ON 01/22/2017 11:15 AM BY DEW, Erskine Squibb, MD  Could certainly be contributing lower extremity symptoms

## 2017-01-22 NOTE — Patient Instructions (Signed)

## 2017-01-22 NOTE — Assessment & Plan Note (Signed)

## 2017-01-29 NOTE — Progress Notes (Deleted)
I personally reviewed , images. Chest x-ray 01/18/2017, CTAP 12/14/2015; right diaphragm is slightly elevated, morbid obesity.  Spirometry from 09/20/2014; normal FEV to FVC ratio, normal FVC and FEV1 are noted.

## 2017-02-04 ENCOUNTER — Institutional Professional Consult (permissible substitution): Payer: BLUE CROSS/BLUE SHIELD | Admitting: Internal Medicine

## 2017-02-05 ENCOUNTER — Encounter: Payer: Self-pay | Admitting: Internal Medicine

## 2017-02-13 ENCOUNTER — Encounter (INDEPENDENT_AMBULATORY_CARE_PROVIDER_SITE_OTHER): Payer: BLUE CROSS/BLUE SHIELD

## 2017-02-21 ENCOUNTER — Ambulatory Visit: Payer: BLUE CROSS/BLUE SHIELD | Admitting: Cardiovascular Disease

## 2017-12-03 DIAGNOSIS — W57XXXA Bitten or stung by nonvenomous insect and other nonvenomous arthropods, initial encounter: Secondary | ICD-10-CM | POA: Diagnosis not present

## 2017-12-03 DIAGNOSIS — S8010XA Contusion of unspecified lower leg, initial encounter: Secondary | ICD-10-CM | POA: Diagnosis not present

## 2018-01-31 DIAGNOSIS — M25562 Pain in left knee: Secondary | ICD-10-CM | POA: Diagnosis not present

## 2018-03-10 DIAGNOSIS — Z713 Dietary counseling and surveillance: Secondary | ICD-10-CM | POA: Diagnosis not present

## 2018-03-10 DIAGNOSIS — Z6831 Body mass index (BMI) 31.0-31.9, adult: Secondary | ICD-10-CM | POA: Diagnosis not present

## 2018-04-07 DIAGNOSIS — Z6831 Body mass index (BMI) 31.0-31.9, adult: Secondary | ICD-10-CM | POA: Diagnosis not present

## 2018-04-07 DIAGNOSIS — Z713 Dietary counseling and surveillance: Secondary | ICD-10-CM | POA: Diagnosis not present

## 2018-06-26 DIAGNOSIS — J0100 Acute maxillary sinusitis, unspecified: Secondary | ICD-10-CM | POA: Diagnosis not present

## 2018-10-24 ENCOUNTER — Ambulatory Visit (INDEPENDENT_AMBULATORY_CARE_PROVIDER_SITE_OTHER): Payer: 59

## 2018-10-24 ENCOUNTER — Other Ambulatory Visit: Payer: Self-pay

## 2018-10-24 ENCOUNTER — Ambulatory Visit (INDEPENDENT_AMBULATORY_CARE_PROVIDER_SITE_OTHER): Payer: 59 | Admitting: Podiatry

## 2018-10-24 DIAGNOSIS — M7752 Other enthesopathy of left foot: Secondary | ICD-10-CM | POA: Diagnosis not present

## 2018-10-24 DIAGNOSIS — M722 Plantar fascial fibromatosis: Secondary | ICD-10-CM | POA: Diagnosis not present

## 2018-10-24 DIAGNOSIS — M7750 Other enthesopathy of unspecified foot: Secondary | ICD-10-CM | POA: Diagnosis not present

## 2018-10-24 DIAGNOSIS — M7751 Other enthesopathy of right foot: Secondary | ICD-10-CM | POA: Diagnosis not present

## 2018-10-24 DIAGNOSIS — M79672 Pain in left foot: Secondary | ICD-10-CM | POA: Diagnosis not present

## 2018-10-24 DIAGNOSIS — M79671 Pain in right foot: Secondary | ICD-10-CM

## 2018-10-24 NOTE — Patient Instructions (Signed)

## 2018-11-28 ENCOUNTER — Telehealth: Payer: Self-pay | Admitting: Podiatry

## 2018-11-28 NOTE — Telephone Encounter (Signed)
Patient called wanting to know if the doctor could prescribed her an anti inflammatory medication to help with her foot pain until her appt on 12/18/18.

## 2018-12-01 NOTE — Telephone Encounter (Signed)
Left message informing pt I did not see that our doctors had a recent prescription for an antiinflammatory and if she could take Ibuprofen or aleve OTC she could take one or the other as the package instructs.

## 2018-12-09 NOTE — Progress Notes (Signed)
  Subjective:  Patient ID: Samantha Banks, female    DOB: 06-25-1960,  MRN: [id number]  Chief Complaint  Patient presents with  . Foot Pain    pain in bottom of left foot - history of PF bilateral, left worse than right    58 y.o. female presents with the above complaint. Hx as above.  Review of Systems: Negative except as noted in the HPI. Denies N/V/F/Ch.  Past Medical History:  Diagnosis Date  . Allergic rhinitis   . Arthritis   . Headache    chronic migraines   . No pertinent past medical history     Current Outpatient Medications:  .  acetaminophen (TYLENOL) 500 MG tablet, Take 1,000 mg by mouth every 6 (six) hours as needed for moderate pain or headache., Disp: , Rfl:  .  amoxicillin-clavulanate (AUGMENTIN) 875-125 MG tablet, Take 1 tablet by mouth every 12 (twelve) hours., Disp: , Rfl:  .  famotidine (PEPCID) 20 MG tablet, Take 1 tablet (20 mg total) by mouth 2 (two) times daily., Disp: 30 tablet, Rfl: 0 .  fluticasone (FLONASE) 50 MCG/ACT nasal spray, USE 2 SPRAY(S) IN EACH NOSTRIL ONCE DAILY, Disp: , Rfl:  .  meloxicam (MOBIC) 7.5 MG tablet, Take 7.5 mg by mouth daily., Disp: , Rfl:  .  naproxen (NAPROSYN) 500 MG tablet, Take 1 tablet (500 mg total) by mouth 2 (two) times daily., Disp: 15 tablet, Rfl: 0 .  nortriptyline (PAMELOR) 10 MG capsule, Take 20 mg by mouth at bedtime., Disp: , Rfl:   Social History   Tobacco Use  Smoking Status Never Smoker  Smokeless Tobacco Never Used    Allergies  Allergen Reactions  . Latex Rash  . Contrast Media [Iodinated Diagnostic Agents] Hives    Pt states hives with previous injection of IV contarst, IV benadryl given 1 hour prior to injection   Objective:  There were no vitals filed for this visit. There is no height or weight on file to calculate BMI. Constitutional Well developed. Well nourished.  Vascular Dorsalis pedis pulses palpable bilaterally. Posterior tibial pulses palpable bilaterally. Capillary refill  normal to all digits.  No cyanosis or clubbing noted. Pedal hair growth normal.  Neurologic Normal speech. Oriented to person, place, and time. Epicritic sensation to light touch grossly present bilaterally.  Dermatologic Nails well groomed and normal in appearance. No open wounds. No skin lesions.  Orthopedic: Normal joint ROM without pain or crepitus bilaterally. No visible deformities. Tender to palpation at the calcaneal tuber bilaterally. No pain with calcaneal squeeze bilaterally. Ankle ROM diminished range of motion bilaterally. Silfverskiold Test: positive bilaterally.   Radiographs: Taken and reviewed. No acute fractures or dislocations. No evidence of stress fracture.  Plantar heel spur absent. Posterior heel spur absent.   Assessment:   1. Tendinitis of ankle or foot   2. Plantar fasciitis   3. Pain of both heels    Plan:  Patient was evaluated and treated and all questions answered.  Plantar Fasciitis, bilaterally - XR reviewed as above.  - Educated on icing and stretching. Instructions given.  - OTC NSAIDs PRN for pain. - Discussed prompt f/u should pain worsen.  No follow-ups on file.

## 2018-12-18 ENCOUNTER — Encounter: Payer: Self-pay | Admitting: Podiatry

## 2018-12-18 ENCOUNTER — Other Ambulatory Visit: Payer: Self-pay

## 2018-12-18 ENCOUNTER — Encounter

## 2018-12-18 ENCOUNTER — Ambulatory Visit (INDEPENDENT_AMBULATORY_CARE_PROVIDER_SITE_OTHER): Payer: 59 | Admitting: Podiatry

## 2018-12-18 VITALS — Temp 98.2°F

## 2018-12-18 DIAGNOSIS — M722 Plantar fascial fibromatosis: Secondary | ICD-10-CM

## 2019-01-08 ENCOUNTER — Ambulatory Visit: Payer: 59 | Admitting: Podiatry

## 2019-01-12 NOTE — Progress Notes (Signed)
  Subjective:  Patient ID: Samantha Banks, female    DOB: Sep 26, 1960,  MRN: [id number]  Chief Complaint  Patient presents with  . Plantar Fasciitis    i am doing ok on the right and the left is hurting since i have been on it alot     58 y.o. female presents with the above complaint. Hx as above.  Review of Systems: Negative except as noted in the HPI. Denies N/V/F/Ch.  Past Medical History:  Diagnosis Date  . Allergic rhinitis   . Arthritis   . Headache    chronic migraines   . No pertinent past medical history     Current Outpatient Medications:  .  acetaminophen (TYLENOL) 500 MG tablet, Take 1,000 mg by mouth every 6 (six) hours as needed for moderate pain or headache., Disp: , Rfl:  .  amoxicillin-clavulanate (AUGMENTIN) 875-125 MG tablet, Take 1 tablet by mouth every 12 (twelve) hours., Disp: , Rfl:  .  famotidine (PEPCID) 20 MG tablet, Take 1 tablet (20 mg total) by mouth 2 (two) times daily., Disp: 30 tablet, Rfl: 0 .  fluticasone (FLONASE) 50 MCG/ACT nasal spray, USE 2 SPRAY(S) IN EACH NOSTRIL ONCE DAILY, Disp: , Rfl:  .  meloxicam (MOBIC) 7.5 MG tablet, Take 7.5 mg by mouth daily., Disp: , Rfl:  .  naproxen (NAPROSYN) 500 MG tablet, Take 1 tablet (500 mg total) by mouth 2 (two) times daily., Disp: 15 tablet, Rfl: 0 .  nortriptyline (PAMELOR) 10 MG capsule, Take 20 mg by mouth at bedtime., Disp: , Rfl:   Social History   Tobacco Use  Smoking Status Never Smoker  Smokeless Tobacco Never Used    Allergies  Allergen Reactions  . Latex Rash  . Contrast Media [Iodinated Diagnostic Agents] Hives    Pt states hives with previous injection of IV contarst, IV benadryl given 1 hour prior to injection   Objective:   Vitals:   12/18/18 1625  Temp: 98.2 F (36.8 C)   There is no height or weight on file to calculate BMI. Constitutional Well developed. Well nourished.  Vascular Dorsalis pedis pulses palpable bilaterally. Posterior tibial pulses palpable  bilaterally. Capillary refill normal to all digits.  No cyanosis or clubbing noted. Pedal hair growth normal.  Neurologic Normal speech. Oriented to person, place, and time. Epicritic sensation to light touch grossly present bilaterally.  Dermatologic Nails well groomed and normal in appearance. No open wounds. No skin lesions.  Orthopedic: Normal joint ROM without pain or crepitus bilaterally. No visible deformities. Tender to palpation at the calcaneal tuber bilaterally. No pain with calcaneal squeeze bilaterally. Ankle ROM diminished range of motion bilaterally. Silfverskiold Test: positive bilaterally.   Radiographs: none Assessment:   1. Plantar fasciitis    Plan:  Patient was evaluated and treated and all questions answered.  Plantar Fasciitis, bilaterally -Injection delivered to the left plantar fascia, right doing well  Procedure: Injection Tendon/Ligament Consent: Verbal consent obtained. Location: Left plantar fascia at the glabrous junction; medial approach. Skin Prep: Alcohol. Injectate: 1 cc 0.5% marcaine plain, 1 cc dexamethasone phosphate, 0.5 cc kenalog 10. Disposition: Patient tolerated procedure well. Injection site dressed with a band-aid.     No follow-ups on file.

## 2019-01-22 ENCOUNTER — Encounter: Payer: Self-pay | Admitting: Cardiology

## 2019-01-23 ENCOUNTER — Other Ambulatory Visit: Payer: Self-pay

## 2019-01-23 ENCOUNTER — Encounter: Payer: Self-pay | Admitting: Cardiology

## 2019-01-23 ENCOUNTER — Ambulatory Visit (INDEPENDENT_AMBULATORY_CARE_PROVIDER_SITE_OTHER): Payer: 59 | Admitting: Cardiology

## 2019-01-23 VITALS — BP 141/78 | HR 79 | Ht 59.0 in | Wt 156.9 lb

## 2019-01-23 DIAGNOSIS — R0789 Other chest pain: Secondary | ICD-10-CM | POA: Insufficient documentation

## 2019-01-23 DIAGNOSIS — R0602 Shortness of breath: Secondary | ICD-10-CM | POA: Diagnosis not present

## 2019-01-23 HISTORY — DX: Other chest pain: R07.89

## 2019-01-23 NOTE — Progress Notes (Signed)
Patient referred by Shanon Rosser, [name] for atypical chest pain, dyspnea on exertion  Subjective:   Samantha Banks, female    DOB: 12-09-1960, 58 y.o.   MRN: [id number]   Chief Complaint  Patient presents with   Chest Pain   New Patient (Initial Visit)   HPI  58 y.o. African American female with no significant medical problems, referred for evaluation of atypical chest pain and shortness of breath.  Patient works for [employer] as a Geophysicist/field seismologist. She does not do any regular physical activity. She notices retrosternal burning sensation and shortness of breath, mostly at rest. She also complains of pain in her right leg, alsop unrelated to exertion.   Past Medical History:  Diagnosis Date   Allergic rhinitis    Arthritis    Headache    chronic migraines    No pertinent past medical history      Past Surgical History:  Procedure Laterality Date   CESAREAN SECTION     COLONOSCOPY WITH PROPOFOL N/A 12/17/2016   Procedure: COLONOSCOPY WITH PROPOFOL;  Surgeon: Lollie Sails, MD;  Location: [facility] ENDOSCOPY;  Service: Endoscopy;  Laterality: N/A;     Social History   Socioeconomic History   Marital status: Widowed    Spouse name: Not on file   Number of children: 3   Years of education: Not on file   Highest education level: Not on file  Occupational History   Occupation: cna  Social Needs   Financial resource strain: Not on file   Food insecurity    Worry: Not on file    Inability: Not on file   Transportation needs    Medical: Not on file    Non-medical: Not on file  Tobacco Use   Smoking status: Never Smoker   Smokeless tobacco: Never Used  Substance and Sexual Activity   Alcohol use: No    Alcohol/week: 0.0 standard drinks   Drug use: No   Sexual activity: Not Currently    Birth control/protection: None  Lifestyle   Physical activity    Days per week: Not on file    Minutes per session: Not on file   Stress:  Not on file  Relationships   Social connections    Talks on phone: Not on file    Gets together: Not on file    Attends religious service: Not on file    Active member of club or organization: Not on file    Attends meetings of clubs or organizations: Not on file    Relationship status: Not on file   Intimate partner violence    Fear of current or ex partner: Not on file    Emotionally abused: Not on file    Physically abused: Not on file    Forced sexual activity: Not on file  Other Topics Concern   Not on file  Social History Narrative   Not on file     Family History  Problem Relation Age of Onset   Throat cancer Mother    Osteoarthritis Mother    Hypertension Father    Diabetes Mellitus II Sister    Asthma Sister    Other Neg Hx      Current Outpatient Medications on File Prior to Visit  Medication Sig Dispense Refill   acetaminophen (TYLENOL) 500 MG tablet Take 1,000 mg by mouth every 6 (six) hours as needed for moderate pain or headache.     No current facility-administered medications on file  prior to visit.     Cardiovascular studies:  EKG 01/23/2019: Sinus rhythm 77 bpm. Normal EKG.    Recent labs: 01/04/2019: Glucose 97. BUN/Cr 17/0.69. eGFR normal. Na/K 141/4.3. Rest of the CMP normal. H/H 14/42. MCV 89. Platelets 239. WBC 12.1 (3.8-10.8), Monocytes 1016 (200-950) TSH 1.2   Review of Systems  Constitution: Negative for decreased appetite, malaise/fatigue, weight gain and weight loss.  HENT: Negative for congestion.   Eyes: Negative for visual disturbance.  Cardiovascular: Positive for chest pain. Negative for dyspnea on exertion, leg swelling, palpitations and syncope.  Respiratory: Positive for shortness of breath. Negative for cough.   Endocrine: Negative for cold intolerance.  Hematologic/Lymphatic: Does not bruise/bleed easily.  Skin: Negative for itching and rash.  Musculoskeletal: Negative for myalgias.       Right leg pain    Gastrointestinal: Negative for abdominal pain, nausea and vomiting.  Genitourinary: Negative for dysuria.  Neurological: Negative for dizziness and weakness.  Psychiatric/Behavioral: The patient is not nervous/anxious.   All other systems reviewed and are negative.       Vitals:   01/23/19 1504  BP: (!) 141/78  Pulse: 79  SpO2: 100%     Body mass index is 31.69 kg/m. Filed Weights   01/23/19 1504  Weight: 156 lb 14.4 oz (71.2 kg)    Objective:   Physical Exam  Constitutional: She is oriented to person, place, and time. She appears well-developed and well-nourished. No distress.  HENT:  Head: Normocephalic and atraumatic.  Eyes: Pupils are equal, round, and reactive to light. Conjunctivae are normal.  Neck: No JVD present.  Cardiovascular: Normal rate, regular rhythm and intact distal pulses.  Pulmonary/Chest: Effort normal and breath sounds normal. She has no wheezes. She has no rales.  Abdominal: Soft. Bowel sounds are normal. There is no rebound.  Musculoskeletal:        General: No edema.  Lymphadenopathy:    She has no cervical adenopathy.  Neurological: She is alert and oriented to person, place, and time. No cranial nerve deficit.  Skin: Skin is warm and dry.  Psychiatric: She has a normal mood and affect.  Nursing note and vitals reviewed.         Assessment & Recommendations:   58 y.o. African American female with no significant medical problems, referred for evaluation of atypical chest pain and shortness of breath.  Chest pain, shortness of breath, leg pain: All symptoms are non exertional. Normal cardiovascular exam and EKG. No major risk factors for CAD. Will obtain exercise treadmill stress test and echocardiogram.   Thank you for referring the patient to [place]. Please feel free to contact with any questions.  Nigel Mormon, MD [facility] Pager: [phone] Office: [phone] If no answer Cell [phone]

## 2019-03-01 NOTE — Progress Notes (Deleted)
Patient referred by Shanon Rosser, [name] for atypical chest pain, dyspnea on exertion  Subjective:   Samantha Banks, female    DOB: August 11, 1960, 58 y.o.   MRN: [id number]   No chief complaint on file.  HPI  58 y.o. African American female with no significant medical problems, referred for evaluation of atypical chest pain and shortness of breath.  Patient works for [employer] as a Geophysicist/field seismologist. She does not do any regular physical activity. She notices retrosternal burning sensation and shortness of breath, mostly at rest. She also complains of pain in her right leg, alsop unrelated to exertion.   Past Medical History:  Diagnosis Date  . Allergic rhinitis   . Arthritis   . Headache    chronic migraines   . No pertinent past medical history      Past Surgical History:  Procedure Laterality Date  . CESAREAN SECTION    . COLONOSCOPY WITH PROPOFOL N/A 12/17/2016   Procedure: COLONOSCOPY WITH PROPOFOL;  Surgeon: Lollie Sails, MD;  Location: [facility] ENDOSCOPY;  Service: Endoscopy;  Laterality: N/A;     Social History   Socioeconomic History  . Marital status: Widowed    Spouse name: Not on file  . Number of children: 3  . Years of education: Not on file  . Highest education level: Not on file  Occupational History  . Occupation: cna  Social Needs  . Financial resource strain: Not on file  . Food insecurity    Worry: Not on file    Inability: Not on file  . Transportation needs    Medical: Not on file    Non-medical: Not on file  Tobacco Use  . Smoking status: Never Smoker  . Smokeless tobacco: Never Used  Substance and Sexual Activity  . Alcohol use: No    Alcohol/week: 0.0 standard drinks  . Drug use: No  . Sexual activity: Not Currently    Birth control/protection: None  Lifestyle  . Physical activity    Days per week: Not on file    Minutes per session: Not on file  . Stress: Not on file  Relationships  . Social Herbalist  on phone: Not on file    Gets together: Not on file    Attends religious service: Not on file    Active member of club or organization: Not on file    Attends meetings of clubs or organizations: Not on file    Relationship status: Not on file  . Intimate partner violence    Fear of current or ex partner: Not on file    Emotionally abused: Not on file    Physically abused: Not on file    Forced sexual activity: Not on file  Other Topics Concern  . Not on file  Social History Narrative  . Not on file     Family History  Problem Relation Age of Onset  . Throat cancer Mother   . Osteoarthritis Mother   . Hypertension Father   . Diabetes Mellitus II Sister   . Asthma Sister   . Other Neg Hx      Current Outpatient Medications on File Prior to Visit  Medication Sig Dispense Refill  . acetaminophen (TYLENOL) 500 MG tablet Take 1,000 mg by mouth every 6 (six) hours as needed for moderate pain or headache.     No current facility-administered medications on file prior to visit.     Cardiovascular studies:  EKG 01/23/2019:  Sinus rhythm 77 bpm. Normal EKG.    Recent labs: 01/04/2019: Glucose 97. BUN/Cr 17/0.69. eGFR normal. Na/K 141/4.3. Rest of the CMP normal. H/H 14/42. MCV 89. Platelets 239. WBC 12.1 (3.8-10.8), Monocytes 1016 (200-950) TSH 1.2   Review of Systems  Constitution: Negative for decreased appetite, malaise/fatigue, weight gain and weight loss.  HENT: Negative for congestion.   Eyes: Negative for visual disturbance.  Cardiovascular: Positive for chest pain. Negative for dyspnea on exertion, leg swelling, palpitations and syncope.  Respiratory: Positive for shortness of breath. Negative for cough.   Endocrine: Negative for cold intolerance.  Hematologic/Lymphatic: Does not bruise/bleed easily.  Skin: Negative for itching and rash.  Musculoskeletal: Negative for myalgias.       Right leg pain  Gastrointestinal: Negative for abdominal pain, nausea and  vomiting.  Genitourinary: Negative for dysuria.  Neurological: Negative for dizziness and weakness.  Psychiatric/Behavioral: The patient is not nervous/anxious.   All other systems reviewed and are negative.      *** There were no vitals filed for this visit.  *** There is no height or weight on file to calculate BMI. There were no vitals filed for this visit.  Objective:   Physical Exam  Constitutional: She is oriented to person, place, and time. She appears well-developed and well-nourished. No distress.  HENT:  Head: Normocephalic and atraumatic.  Eyes: Pupils are equal, round, and reactive to light. Conjunctivae are normal.  Neck: No JVD present.  Cardiovascular: Normal rate, regular rhythm and intact distal pulses.  Pulmonary/Chest: Effort normal and breath sounds normal. She has no wheezes. She has no rales.  Abdominal: Soft. Bowel sounds are normal. There is no rebound.  Musculoskeletal:        General: No edema.  Lymphadenopathy:    She has no cervical adenopathy.  Neurological: She is alert and oriented to person, place, and time. No cranial nerve deficit.  Skin: Skin is warm and dry.  Psychiatric: She has a normal mood and affect.  Nursing note and vitals reviewed.         Assessment & Recommendations:   58 y.o. African American female with no significant medical problems, referred for evaluation of atypical chest pain and shortness of breath.  Chest pain, shortness of breath, leg pain: All symptoms are non exertional. Normal cardiovascular exam and EKG. No major risk factors for CAD. Will obtain exercise treadmill stress test and echocardiogram.   Thank you for referring the patient to [place]. Please feel free to contact with any questions.  Nigel Mormon, MD [facility] Pager: [phone] Office: [phone] If no answer Cell [phone]

## 2019-03-02 ENCOUNTER — Ambulatory Visit: Payer: 59 | Admitting: Cardiology

## 2019-03-02 ENCOUNTER — Other Ambulatory Visit: Payer: Self-pay

## 2019-03-02 ENCOUNTER — Ambulatory Visit (INDEPENDENT_AMBULATORY_CARE_PROVIDER_SITE_OTHER): Payer: 59

## 2019-03-02 DIAGNOSIS — R0602 Shortness of breath: Secondary | ICD-10-CM | POA: Diagnosis not present

## 2019-03-02 DIAGNOSIS — R0789 Other chest pain: Secondary | ICD-10-CM

## 2019-03-09 ENCOUNTER — Telehealth: Payer: Self-pay

## 2019-03-09 NOTE — Telephone Encounter (Signed)
-----   Message from [facility], MD sent at 03/07/2019  8:34 PM EDT ----- (Please check echo result and call the patient with both results). Low normal exercise capacity. No significant abnormalities on EKG to suggest heart artery abnormalities).  Thanks MJP

## 2019-03-09 NOTE — Progress Notes (Signed)
Called pt no answer, left a vm

## 2019-03-09 NOTE — Telephone Encounter (Signed)
Spoke with patient concerning ECHO and Stress test results. Patient verbalized understanding.

## 2019-03-10 NOTE — Progress Notes (Signed)
Called pt  LVM.

## 2019-03-19 NOTE — Progress Notes (Signed)
Called pt no answer, left a vm

## 2019-04-29 NOTE — Progress Notes (Signed)
Called pt no answer, left a vm

## 2019-07-04 ENCOUNTER — Other Ambulatory Visit: Payer: Self-pay

## 2019-07-04 ENCOUNTER — Emergency Department (HOSPITAL_COMMUNITY): Payer: 59

## 2019-07-04 ENCOUNTER — Emergency Department (HOSPITAL_COMMUNITY)
Admission: EM | Admit: 2019-07-04 | Discharge: 2019-07-04 | Disposition: A | Discharge status: Home / Self Care | Payer: 59 | Attending: Emergency Medicine | Admitting: Emergency Medicine

## 2019-07-04 DIAGNOSIS — R1084 Generalized abdominal pain: Secondary | ICD-10-CM | POA: Insufficient documentation

## 2019-07-04 LAB — COMPREHENSIVE METABOLIC PANEL
ALT: 20 U/L (ref 0–44)
AST: 25 U/L (ref 15–41)
Albumin: 3.9 g/dL (ref 3.5–5.0)
Alkaline Phosphatase: 66 U/L (ref 38–126)
Anion gap: 10 (ref 5–15)
BUN: 5 mg/dL — ABNORMAL LOW (ref 6–20)
CO2: 25 mmol/L (ref 22–32)
Calcium: 8.6 mg/dL — ABNORMAL LOW (ref 8.9–10.3)
Chloride: 104 mmol/L (ref 98–111)
Creatinine, Ser: 0.64 mg/dL (ref 0.44–1.00)
GFR calc Af Amer: >60 mL/min (ref >60)
GFR calc non Af Amer: >60 mL/min (ref >60)
Glucose, Bld: 103 mg/dL — ABNORMAL HIGH (ref 70–99)
Potassium: 3.9 mmol/L (ref 3.5–5.1)
Sodium: 139 mmol/L (ref 135–145)
Total Bilirubin: 0.2 mg/dL — ABNORMAL LOW (ref 0.3–1.2)
Total Protein: 6.7 g/dL (ref 6.5–8.1)

## 2019-07-04 LAB — CBC WITH DIFFERENTIAL/PLATELET
Abs Immature Granulocytes: 0.01 10*3/uL (ref 0.00–0.07)
Basophils Absolute: 0 10*3/uL (ref 0.0–0.1)
Basophils Relative: 0 %
Eosinophils Absolute: 0 10*3/uL (ref 0.0–0.5)
Eosinophils Relative: 1 %
HCT: 45.5 % (ref 36.0–46.0)
Hemoglobin: 14.7 g/dL (ref 12.0–15.0)
Immature Granulocytes: 0 %
Lymphocytes Relative: 23 %
Lymphs Abs: 1.3 10*3/uL (ref 0.7–4.0)
MCH: 30.3 pg (ref 26.0–34.0)
MCHC: 32.3 g/dL (ref 30.0–36.0)
MCV: 93.8 fL (ref 80.0–100.0)
Monocytes Absolute: 0.7 10*3/uL (ref 0.1–1.0)
Monocytes Relative: 13 %
Neutro Abs: 3.5 10*3/uL (ref 1.7–7.7)
Neutrophils Relative %: 63 %
Platelets: 181 10*3/uL (ref 150–400)
RBC: 4.85 MIL/uL (ref 3.87–5.11)
RDW: 13.2 % (ref 11.5–15.5)
WBC: 5.6 10*3/uL (ref 4.0–10.5)
nRBC: 0 % (ref 0.0–0.2)

## 2019-07-04 LAB — URINALYSIS, ROUTINE W REFLEX MICROSCOPIC
Bilirubin Urine: NEGATIVE
Glucose, UA: NEGATIVE mg/dL
Hgb urine dipstick: NEGATIVE
Ketones, ur: NEGATIVE mg/dL
Leukocytes,Ua: NEGATIVE
Nitrite: NEGATIVE
Protein, ur: NEGATIVE mg/dL
Specific Gravity, Urine: 1.003 — ABNORMAL LOW (ref 1.005–1.030)
pH: 7 (ref 5.0–8.0)

## 2019-07-04 LAB — LIPASE, BLOOD: Lipase: 22 U/L (ref 11–51)

## 2019-07-04 MED ORDER — DICYCLOMINE HCL 10 MG PO CAPS
10.0000 mg | ORAL_CAPSULE | Freq: Once | ORAL | Status: AC
  Administered 2019-07-04: 10 mg via ORAL
  Filled 2019-07-04: qty 1

## 2019-07-04 MED ORDER — ONDANSETRON 4 MG PO TBDP: 4.0000 mg | ORAL_TABLET | Freq: Three times a day (TID) | ORAL | 0 refills | Status: DC | PRN

## 2019-07-04 MED ORDER — ALUM & MAG HYDROXIDE-SIMETH 200-200-20 MG/5ML PO SUSP
30.0000 mL | Freq: Once | ORAL | Status: AC
  Administered 2019-07-04: 08:00:00 30 mL via ORAL
  Filled 2019-07-04: qty 30

## 2019-07-04 MED ORDER — DICYCLOMINE HCL 20 MG PO TABS: 20.0000 mg | ORAL_TABLET | Freq: Two times a day (BID) | ORAL | 0 refills | Status: DC

## 2019-07-04 MED ORDER — PANTOPRAZOLE SODIUM 40 MG PO TBEC: 40.0000 mg | DELAYED_RELEASE_TABLET | Freq: Every day | ORAL | 0 refills | Status: DC

## 2019-07-04 NOTE — Discharge Instructions (Signed)
You were seen in the emergency department today with abdominal pain.  Your labs and imaging were reassuring.  I would like for you to follow both with your primary care doctor as well as a gastroenterologist.  Return to the emergency department any new or suddenly worsening symptoms.  Please take medications as prescribed.

## 2019-07-04 NOTE — ED Provider Notes (Signed)
Emergency Department Provider Note   I have reviewed the triage vital signs and the nursing notes.   HISTORY  Chief Complaint Abdominal Pain   HPI Samantha Banks is a 59 y.o. female presents to the emergency department for evaluation of abdominal pain over the past 5 months.  She describes a burning type pain in her back but states it is migratory and has since moved around to her anterior abdomen.  She has decreased appetite without nausea, vomiting, diarrhea.  She continues to have normal bowel movements although somewhat less frequent.  She is not experiencing fever or shaking chills.  She states that she was seen in the emergency department near the beginning of symptoms and diagnosed with "some kind of infection."  She tells me that she took 5 days of an antibiotic as prescribed with no change in symptoms.  She presents today because over the past 3 days her symptoms seem to have worsened and she is mainly been lying in bed.  No change in pain with eating or drinking. Denies CP or SOB.   Past Medical History:  Diagnosis Date  . Allergic rhinitis   . Arthritis   . Headache    chronic migraines   . No pertinent past medical history     Patient Active Problem List   Diagnosis Date Noted  . Atypical chest pain 01/23/2019  . Arthritis 01/22/2017  . Swelling of limb 01/22/2017  . Pain in limb 01/22/2017  . Chronic tension-type headache, not intractable 01/09/2016  . Anxiety state 01/05/2016  . Lumbar radiculopathy 01/05/2016  . Migraine without aura and without status migrainosus, not intractable 01/05/2016  . Paresthesia 01/05/2016  . Vocal cord dysfunction 01/05/2016  . Chronic migraine 04/13/2015  . Seasonal allergic rhinitis due to pollen 03/30/2015  . Dyspnea 09/20/2014  . Chest wall pain 09/20/2014    Past Surgical History:  Procedure Laterality Date  . CESAREAN SECTION    . COLONOSCOPY WITH PROPOFOL N/A 12/17/2016   Procedure: COLONOSCOPY WITH PROPOFOL;   Surgeon: Lollie Sails, MD;  Location: [facility] ENDOSCOPY;  Service: Endoscopy;  Laterality: N/A;    Allergies Latex and Contrast media [iodinated diagnostic agents]  Family History  Problem Relation Age of Onset  . Throat cancer Mother   . Osteoarthritis Mother   . Hypertension Father   . Diabetes Mellitus II Sister   . Asthma Sister   . Other Neg Hx     Social History Social History   Tobacco Use  . Smoking status: Never Smoker  . Smokeless tobacco: Never Used  Substance Use Topics  . Alcohol use: No    Alcohol/week: 0.0 standard drinks  . Drug use: No    Review of Systems  Constitutional: No fever/chills Eyes: No visual changes. ENT: No sore throat. Cardiovascular: Denies chest pain. Respiratory: Denies shortness of breath. Gastrointestinal: Positive diffuse abdominal pain.  No nausea, no vomiting.  No diarrhea.  No constipation. Poor appetite.  Genitourinary: Negative for dysuria. Musculoskeletal: Positive for burning back pain. Skin: Negative for rash. Neurological: Negative for headaches, focal weakness or numbness.  10-point ROS otherwise negative.  ____________________________________________   PHYSICAL EXAM:  VITAL SIGNS: ED Triage Vitals  Enc Vitals Group     BP 07/04/19 0717 129/76     Pulse Rate 07/04/19 0717 80     Resp 07/04/19 0717 16     Temp 07/04/19 0717 98.5 F (36.9 C)     Temp Source 07/04/19 0717 Oral     SpO2  07/04/19 0717 99 %     Weight 07/04/19 0713 143 lb (64.9 kg)     Height 07/04/19 0713 4\' 11"  (1.499 m)   Constitutional: Alert and oriented. Well appearing and in no acute distress. Eyes: Conjunctivae are normal. Head: Atraumatic. Nose: No congestion/rhinnorhea. Mouth/Throat: Mucous membranes are moist.   Neck: No stridor.   Cardiovascular: Normal rate, regular rhythm. Good peripheral circulation. Grossly normal heart sounds.   Respiratory: Normal respiratory effort.  No retractions. Lungs CTAB. Gastrointestinal: Soft  with mild diffuse tenderness. No focal rebound or guarding. No distention.  Musculoskeletal: No gross deformities of extremities. Neurologic:  Normal speech and language.  Skin:  Skin is warm, dry and intact. No rash noted.   ____________________________________________   LABS (all labs ordered are listed, but only abnormal results are displayed)  Labs Reviewed  COMPREHENSIVE METABOLIC PANEL - Abnormal; Notable for the following components:      Result Value   Glucose, Bld 103 (*)    BUN 5 (*)    Calcium 8.6 (*)    Total Bilirubin 0.2 (*)    All other components within normal limits  URINALYSIS, ROUTINE W REFLEX MICROSCOPIC - Abnormal; Notable for the following components:   Color, Urine STRAW (*)    Specific Gravity, Urine 1.003 (*)    All other components within normal limits  LIPASE, BLOOD  CBC WITH DIFFERENTIAL/PLATELET   ____________________________________________  RADIOLOGY  CT Renal Stone Study  Result Date: 07/04/2019 CLINICAL DATA:  59 year old with right flank and back pain. EXAM: CT ABDOMEN AND PELVIS WITHOUT CONTRAST TECHNIQUE: Multidetector CT imaging of the abdomen and pelvis was performed following the standard protocol without IV contrast. COMPARISON:  12/14/2015 FINDINGS: Lower chest: Few densities in the dependent right lung and base of the left lung are suggestive for atelectasis. No large pleural effusions. Hepatobiliary: Normal appearance of the liver and gallbladder. Focal low-density structure along the medial right hepatic lobe measures 8 mm and could represent a cyst. No biliary dilatation. Pancreas: Unremarkable. No pancreatic ductal dilatation or surrounding inflammatory changes. Spleen: Normal in size without focal abnormality. Adrenals/Urinary Tract: Normal appearance of the adrenal glands. Normal appearance of the urinary bladder. 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 There is also a right extrarenal pelvis which is chronic. No  evidence for hydronephrosis. No evidence for kidney stones. No ureter dilatation. Stomach/Bowel: Stomach is within normal limits. Appendix appears normal. No evidence of bowel wall thickening, distention, or inflammatory changes. Vascular/Lymphatic: Normal caliber of the abdominal aorta without aneurysm and no significant atherosclerotic calcifications in the aorta or iliac arteries. Venous structures are unremarkable. No abdominopelvic lymphadenopathy. Reproductive: Uterus and bilateral adnexa are unremarkable. Other: Negative for ascites.  Negative for free air. Musculoskeletal: No acute or significant osseous findings. IMPRESSION: 1. No acute abnormality in the abdomen or pelvis. 2. No evidence for kidney stones or hydronephrosis. Electronically Signed   By: Markus Daft M.D.   On: 07/04/2019 08:42    ____________________________________________   PROCEDURES  Procedure(s) performed:   Procedures  None ____________________________________________   INITIAL IMPRESSION / ASSESSMENT AND PLAN / ED COURSE  Pertinent labs & imaging results that were available during my care of the patient were reviewed by me and considered in my medical decision making (see chart for details).   Patient presents to the emergency department for evaluation of diffuse abdominal pain without focal tenderness on exam.  Symptoms have been ongoing for the past 5 months.  No clear historical features to make a diagnosis  clinically.  Plan for CT renal, screening labs.  Patient may ultimately require close follow-up with PCP and GI.   CT renal negative. Labs reviewed. No acute findings. Provided contact information for GI and placed referral in Epic. Discussed ED return precautions. Plan for symptom mgmt at home and PPI. Discussed ED return precautions.  ____________________________________________  FINAL CLINICAL IMPRESSION(S) / ED DIAGNOSES  Final diagnoses:  Generalized abdominal pain     MEDICATIONS GIVEN DURING  THIS VISIT:  Medications  dicyclomine (BENTYL) capsule 10 mg (10 mg Oral Given 07/04/19 0738)  alum & mag hydroxide-simeth (MAALOX/MYLANTA) 200-200-20 MG/5ML suspension 30 mL (30 mLs Oral Given 07/04/19 0738)     NEW OUTPATIENT MEDICATIONS STARTED DURING THIS VISIT:  Discharge Medication List as of 07/04/2019  9:35 AM    START taking these medications   Details  dicyclomine (BENTYL) 20 MG tablet Take 1 tablet (20 mg total) by mouth 2 (two) times daily., Starting Sat 07/04/2019, Normal    ondansetron (ZOFRAN ODT) 4 MG disintegrating tablet Take 1 tablet (4 mg total) by mouth every 8 (eight) hours as needed for nausea or vomiting., Starting Sat 07/04/2019, Normal    pantoprazole (PROTONIX) 40 MG tablet Take 1 tablet (40 mg total) by mouth daily., Starting Sat 07/04/2019, Until Mon 08/03/2019, Normal        Note:  This document was prepared using Dragon voice recognition software and may include unintentional dictation errors.  Nanda Quinton, MD, [facility]    Emori Kamau, [facility], MD 07/04/19 Darlin Drop

## 2019-07-04 NOTE — ED Notes (Signed)
Patient verbalizes understanding of discharge instructions . Opportunity for questions and answers were provided . Armband removed by staff ,Pt discharged from ED. W/C  offered at D/C  and Declined W/C at D/C and was escorted to lobby by RN.  

## 2019-07-04 NOTE — ED Triage Notes (Signed)
Pt reports ABD pain.

## 2019-07-07 ENCOUNTER — Ambulatory Visit (INDEPENDENT_AMBULATORY_CARE_PROVIDER_SITE_OTHER): Payer: 59 | Admitting: Internal Medicine

## 2019-07-07 ENCOUNTER — Encounter: Payer: Self-pay | Admitting: Internal Medicine

## 2019-07-07 VITALS — BP 110/72 | HR 80 | Temp 97.8°F | Ht 59.0 in | Wt 157.0 lb

## 2019-07-07 DIAGNOSIS — Z01818 Encounter for other preprocedural examination: Secondary | ICD-10-CM | POA: Diagnosis not present

## 2019-07-07 DIAGNOSIS — R1010 Upper abdominal pain, unspecified: Secondary | ICD-10-CM | POA: Diagnosis not present

## 2019-07-07 DIAGNOSIS — R1030 Lower abdominal pain, unspecified: Secondary | ICD-10-CM

## 2019-07-07 NOTE — Progress Notes (Signed)
HISTORY OF PRESENT ILLNESS:  Samantha Banks is a 59 y.o. female with a history of arthritis and chronic migraine headaches who was sent today by the hospital emergency room regarding abdominal pain.  The patient reports several weeks of lower abdominal pain with radiation into her right flank.  She states she had this at some point last year and was treated with antibiotics though her pain lasted about 4 weeks.  She also reports discomfort of burning in the chest and epigastric region.  Not necessarily affected by meals.  She does not notice that her pain is affected by bowel movements.  She reports 1 bowel movement every other day which is her baseline.  No melena or hematochezia.  Her weight is stable.  No nocturnal symptoms.  She has not had a gynecology evaluation in some time.  She was seen in the emergency room for this complaint of pain on July 04, 2019.  CBC was normal with hemoglobin 14.7.  Comprehensive metabolic panel was unremarkable with normal liver tests and lipase.  It should be noted that the patient underwent complete colonoscopy December 17, 2016 at [facility].  She was found to have diminutive colon polyps and diverticulosis.  2 polyps were hyperplastic.  One was adenomatous.  The examination was complete and the preparation good.  Patient also underwent CT scan of the abdomen pelvis with contrast in 2017 to evaluate chronic intermittent lower abdominal pain.  This was unremarkable.  She is on no regular medications other than vitamin D and as needed Tylenol as well as multivitamin.  Recent imaging included CT renal stone study which was negative.  Urinalysis was also unremarkable.  REVIEW OF SYSTEMS:  All non-GI ROS negative unless otherwise stated in the HPI except for back pain, visual change, night sweats  Past Medical History:  Diagnosis Date  . Allergic rhinitis   . Arthritis   . Headache    chronic migraines   . No pertinent past medical history      Past Surgical History:  Procedure Laterality Date  . CESAREAN SECTION    . COLONOSCOPY WITH PROPOFOL N/A 12/17/2016   Procedure: COLONOSCOPY WITH PROPOFOL;  Surgeon: Lollie Sails, MD;  Location: [facility] ENDOSCOPY;  Service: Endoscopy;  Laterality: N/A;    Social History Hellena A Buccieri  reports that she has never smoked. She has never used smokeless tobacco. She reports that she does not drink alcohol or use drugs.  family history includes Asthma in her sister; Diabetes Mellitus II in her sister; Hypertension in her father; Osteoarthritis in her mother; Throat cancer in her mother.  Allergies  Allergen Reactions  . Latex Rash  . Contrast Media [Iodinated Diagnostic Agents] Hives    Pt states hives with previous injection of IV contarst, IV benadryl given 1 hour prior to injection       PHYSICAL EXAMINATION: Vital signs: BP 110/72   Pulse 80   Temp 97.8 F (36.6 C)   Ht 4\' 11"  (1.499 m)   Wt 157 lb (71.2 kg)   LMP 02/01/2012   BMI 31.71 kg/m   Constitutional: generally well-appearing, no acute distress Psychiatric: alert and oriented x3, cooperative Eyes: extraocular movements intact, anicteric, conjunctiva pink Mouth: oral pharynx moist, no lesions Neck: supple no lymphadenopathy Cardiovascular: heart regular rate and rhythm, no murmur Lungs: clear to auscultation bilaterally Abdomen: soft, complains of tenderness with mild palpation throughout, nondistended, no obvious ascites, no peritoneal signs, normal bowel sounds, no organomegaly Rectal: Omitted Extremities: no  clubbing, cyanosis, or lower extremity edema bilaterally Skin: no lesions on visible extremities Neuro: No focal deficits.  Cranial nerves intact  ASSESSMENT:  1.  Atypical abdominal pain with upper and lower features as well as right flank/back component.  Labs and CT imaging unremarkable.  Prior colonoscopy 2018 as described.  Rule out upper GI mucosal lesion, rule out GYN process, rule out  primary back pain with radiation. 2.  Diminutive adenoma on colonoscopy 2018. 3.  History of arthritis and headaches   PLAN:  1.  Schedule upper endoscopy to rule up upper GI mucosal lesion.The nature of the procedure, as well as the risks, benefits, and alternatives were carefully and thoroughly reviewed with the patient. Ample time for discussion and questions allowed. The patient understood, was satisfied, and agreed to proceed. 2.  Gynecology evaluation ASAP.  The patient agrees to follow through with this recommendation.  We offered to arrange this for her if she would like.  She understands and will reach out if she wishes 3.  If the above negative then she should return to the care of her primary provider to explore the possibility of lumbar back process with radicular features to explain symptom complex. Total 45 minutes spent preparing to see the patient, reviewed laboratory tests, x-rays, prior endoscopy reports, obtained history, performed comprehensive physical exam, educate patient regarding her symptom complex and potential etiologies as well as care plan, ordering and arranging endoscopy and GYN referral if necessary, and documenting the clinical information in the health record.  As well corresponding with her PCP.

## 2019-07-07 NOTE — Patient Instructions (Signed)
You have been scheduled for an endoscopy. Please follow written instructions given to you at your visit today. If you use inhalers (even only as needed), please bring them with you on the day of your procedure.   

## 2019-08-03 ENCOUNTER — Encounter: Payer: 59 | Admitting: Internal Medicine

## 2019-08-16 ENCOUNTER — Emergency Department (HOSPITAL_COMMUNITY)
Admission: EM | Admit: 2019-08-16 | Discharge: 2019-08-16 | Disposition: A | Discharge status: Home / Self Care | Payer: 59 | Attending: Emergency Medicine | Admitting: Emergency Medicine

## 2019-08-16 ENCOUNTER — Emergency Department (HOSPITAL_COMMUNITY): Payer: 59

## 2019-08-16 ENCOUNTER — Emergency Department (HOSPITAL_BASED_OUTPATIENT_CLINIC_OR_DEPARTMENT_OTHER): Payer: 59

## 2019-08-16 ENCOUNTER — Other Ambulatory Visit: Payer: Self-pay

## 2019-08-16 DIAGNOSIS — Z91040 Latex allergy status: Secondary | ICD-10-CM | POA: Insufficient documentation

## 2019-08-16 DIAGNOSIS — M79605 Pain in left leg: Secondary | ICD-10-CM

## 2019-08-16 DIAGNOSIS — M79662 Pain in left lower leg: Secondary | ICD-10-CM | POA: Diagnosis not present

## 2019-08-16 DIAGNOSIS — M79609 Pain in unspecified limb: Secondary | ICD-10-CM | POA: Diagnosis not present

## 2019-08-16 DIAGNOSIS — Z79899 Other long term (current) drug therapy: Secondary | ICD-10-CM | POA: Insufficient documentation

## 2019-08-16 MED ORDER — LIDOCAINE 5 % EX PTCH: 1.0000 | MEDICATED_PATCH | CUTANEOUS | 0 refills | Status: DC

## 2019-08-16 MED ORDER — NAPROXEN 500 MG PO TABS: 500.0000 mg | ORAL_TABLET | Freq: Two times a day (BID) | ORAL | 0 refills | Status: DC

## 2019-08-16 MED ORDER — NAPROXEN 250 MG PO TABS
500.0000 mg | ORAL_TABLET | Freq: Once | ORAL | Status: AC
  Administered 2019-08-16: 500 mg via ORAL
  Filled 2019-08-16: qty 2

## 2019-08-16 NOTE — Progress Notes (Signed)
VASCULAR LAB PRELIMINARY  PRELIMINARY  PRELIMINARY  PRELIMINARY  Left lower extremity venous duplex completed.    Preliminary report:  See CV proc for preliminary results.   Gave Britni Henderly, [name] report  Chevella Pearce, RVT 08/16/2019, 3:07 PM

## 2019-08-16 NOTE — ED Notes (Signed)
Pt discharge instructions and prescriptions reviewed with the patient. The patient verbalized understanding of both. Pt discharged. 

## 2019-08-16 NOTE — ED Triage Notes (Signed)
Per pt she has been having left leg pain for about 2 days now. Pt said from knee all the way to her ankle. She said feels like it is going to bust.

## 2019-08-16 NOTE — Discharge Instructions (Signed)
Make sure to drink plenty of fluids and rest.  Elevate your leg.  Take the anti-inflammatories.  Follow-up with PCP for reevaluation if symptoms unresolved in 3 to 4 days

## 2019-08-16 NOTE — ED Provider Notes (Signed)
[facility] EMERGENCY DEPARTMENT Provider Note   CSN: [id number] Arrival date & time: 08/16/19  [id number]    History Chief Complaint  Patient presents with  . Leg Pain    Samantha Banks is a 59 y.o. female with past medical history significant for chronic headache, anxiety, paresthesias, chronic dyspnea who presents for evaluation of leg pain.  Patient feels like her left lower extremity has been aching which started yesterday.  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  Feels like her pain goes from her knee to her ankle.  Took 1 dose of Tylenol yesterday.  She feels like her leg was swollen yesterday but not today.  Symptoms are only located on the left side.  She denies any headache, lightheadedness, dizziness, fever, chills, nausea, vomiting, chest pain, shortness of breath, abdominal pain, diarrhea, dysuria.  No prior history of PE or DVT.  No recent surgery, immobilization, malignancy.  Rates her pain a 6/10.  Denies additional aggravating or alleviating factors.  Denies redness, warmth, paresthesias.  Is able to ambulate without difficulty  History obtained from patient and past medical records.  No interpreter is used.  HPI     Past Medical History:  Diagnosis Date  . Allergic rhinitis   . Arthritis   . Headache    chronic migraines   . No pertinent past medical history     Patient Active Problem List   Diagnosis Date Noted  . Atypical chest pain 01/23/2019  . Arthritis 01/22/2017  . Swelling of limb 01/22/2017  . Pain in limb 01/22/2017  . Chronic tension-type headache, not intractable 01/09/2016  . Anxiety state 01/05/2016  . Lumbar radiculopathy 01/05/2016  . Migraine without aura and without status migrainosus, not intractable 01/05/2016  . Paresthesia 01/05/2016  . Vocal cord dysfunction 01/05/2016  . Chronic migraine 04/13/2015  . Seasonal allergic rhinitis due to pollen 03/30/2015  . Dyspnea  09/20/2014  . Chest wall pain 09/20/2014    Past Surgical History:  Procedure Laterality Date  . CESAREAN SECTION    . COLONOSCOPY WITH PROPOFOL N/A 12/17/2016   Procedure: COLONOSCOPY WITH PROPOFOL;  Surgeon: Lollie Sails, MD;  Location: [facility] ENDOSCOPY;  Service: Endoscopy;  Laterality: N/A;     OB History    Gravida  3   Para  3   Term  3   Preterm  0   AB  0   Living  3     SAB  0   TAB  0   Ectopic  0   Multiple  0   Live Births              Family History  Problem Relation Age of Onset  . Throat cancer Mother   . Osteoarthritis Mother   . Hypertension Father   . Diabetes Mellitus II Sister   . Asthma Sister   . Other Neg Hx   . Stomach cancer Neg Hx   . Pancreatic cancer Neg Hx   . Colon cancer Neg Hx     Social History   Tobacco Use  . Smoking status: Never Smoker  . Smokeless tobacco: Never Used  Substance Use Topics  . Alcohol use: No    Alcohol/week: 0.0 standard drinks  . Drug use: No    Home Medications Prior to Admission medications   Medication Sig Start Date End Date Taking? Authorizing Provider  acetaminophen (TYLENOL) 500 MG tablet  Take 1,000 mg by mouth every 6 (six) hours as needed for moderate pain or headache.    [provider]  cholecalciferol (VITAMIN D3) 25 MCG (1000 UNIT) tablet Take 1,000 Units by mouth daily.    [provider]  lidocaine (LIDODERM) 5 % Place 1 patch onto the skin daily. Remove & Discard patch within 12 hours or as directed by MD 08/16/19   Suhaan Perleberg A, [name]  Multiple Vitamin (MULTIVITAMIN ADULT PO) Take by mouth.    [provider]  naproxen (NAPROSYN) 500 MG tablet Take 1 tablet (500 mg total) by mouth 2 (two) times daily. 08/16/19   Marlen Koman A, [name]    Allergies    Latex and Contrast media [iodinated diagnostic agents]  Review of Systems   Review of Systems  Constitutional: Negative.   HENT: Negative.   Respiratory: Negative.   Cardiovascular:  Negative.   Gastrointestinal: Negative.   Genitourinary: Negative.   Musculoskeletal: Negative for arthralgias, back pain, gait problem, myalgias, neck pain and neck stiffness.       Left leg pain  Skin: Negative.   Neurological: Negative.   All other systems reviewed and are negative.   Physical Exam Updated Vital Signs BP 140/80 (BP Location: Left Arm)   Pulse 70   Temp 98.7 F (37.1 C) (Oral)   Resp 16   Ht 4\' 11"  (1.499 m)   Wt 68 kg   LMP 02/01/2012   SpO2 100%   BMI 30.30 kg/m   Physical Exam Vitals and nursing note reviewed.  Constitutional:      General: She is not in acute distress.    Appearance: She is well-developed. She is not ill-appearing, toxic-appearing or diaphoretic.  HENT:     Head: Normocephalic and atraumatic.     Nose: Nose normal.     Mouth/Throat:     Mouth: Mucous membranes are moist.     Pharynx: Oropharynx is clear.  Eyes:     Pupils: Pupils are equal, round, and reactive to light.  Cardiovascular:     Rate and Rhythm: Normal rate.     Pulses: Normal pulses.          Dorsalis pedis pulses are 2+ on the right side and 2+ on the left side.       Posterior tibial pulses are 2+ on the right side and 2+ on the left side.     Heart sounds: Normal heart sounds.  Pulmonary:     Effort: Pulmonary effort is normal. No respiratory distress.     Breath sounds: Normal breath sounds.  Abdominal:     General: Bowel sounds are normal. There is no distension.  Musculoskeletal:        General: Normal range of motion.     Cervical back: Normal range of motion.     Right hip: Normal.     Left hip: Normal.     Right upper leg: Normal.     Left upper leg: Normal.     Right knee: Normal.     Left knee: Normal.     Right lower leg: Normal.     Left lower leg: Normal.     Right ankle: Normal.     Right Achilles Tendon: Normal.     Left ankle: Normal.     Left Achilles Tendon: Normal.     Right foot: Normal.     Left foot: Normal.       Legs:      Comments: Diffuse tenderness  to left leg from knee to ankle however no appreciable edema, erythema or warmth.  No bony tenderness.  No recent falls or injuries.  Compartments soft.  Bevelyn Buckles' sign negative.  Full range of motion without difficulty  Feet:     Right foot:     Skin integrity: Skin integrity normal.     Left foot:     Skin integrity: Skin integrity normal.  Skin:    General: Skin is warm and dry.     Capillary Refill: Capillary refill takes less than 2 seconds.     Comments: No edema, erythema or warmth.  No rashes or lesions.  No contusions, abrasions, fluctuance or induration  Neurological:     General: No focal deficit present.     Mental Status: She is alert.     Cranial Nerves: Cranial nerves are intact.     Sensory: Sensation is intact.     Motor: Motor function is intact.     Coordination: Coordination is intact.     Gait: Gait is intact.     Comments: 5/5 strength bilateral lower extremities without difficulty.  Intact sensation.  Ambulatory with out difficulty     ED Results / Procedures / Treatments   Labs (all labs ordered are listed, but only abnormal results are displayed) Labs Reviewed - No data to display  EKG None  Radiology DG Knee 2 Views Left  Result Date: 08/16/2019 CLINICAL DATA:  59 year old female with history of left leg pain for the past 2 days. EXAM: LEFT KNEE - 1-2 VIEW COMPARISON:  No priors. FINDINGS: No evidence of fracture, dislocation, or joint effusion. No evidence of arthropathy or other focal bone abnormality. Soft tissues are unremarkable. IMPRESSION: Negative. Electronically Signed   By: Vinnie Langton M.D.   On: 08/16/2019 05:57    Procedures Procedures (including critical care time)  Medications Ordered in ED Medications  naproxen (NAPROSYN) tablet 500 mg (500 mg Oral Given 08/16/19 0901)    ED Course  I have reviewed the triage vital signs and the nursing notes.  Pertinent labs & imaging results that were available  during my care of the patient were reviewed by me and considered in my medical decision making (see chart for details).  59 year old female peers otherwise well presents for evaluation of leg pain which began yesterday.  Patient afebrile, nonseptic, non-ill-appearing.  Neurovascularly intact.  Normal musculoskeletal exam.  Compartments soft.  Bevelyn Buckles' sign negative.  No chest pain, shortness of breath, hemoptysis.  No tachycardia, tachypnea or hypoxia.  No evidence of DVT on exam.  No prior history of PE or DVT.  No evidence of overlying skin changes, specifically no cellulitis.  She is full range of motion without effusion to joints.  We will plan on ultrasound to rule out DVT however I have low suspicion.  Patient does stand on her feet daily and does a lot of bending and twisting as a CNA.  Highly suspect MSK pain.  Reassessed.  Pain with improvement with naproxen.  Ultrasound negative for DVT.  Left knee x-ray obtained from triage does not show any evidence of effusion.  Have low suspicion for gout, septic joint, hemarthrosis, fracture, dislocation, compartment syndrome, DVT, myositis, acute bacterial infectious process or vascular etiology.  Patient DC home with instructions for RICE.  She is follow-up with her PCP for any new worsening symptoms or return to emergency department.  The patient has been appropriately medically screened and/or stabilized in the ED. I have low suspicion for any other  emergent medical condition which would require further screening, evaluation or treatment in the ED or require inpatient management.  Patient is hemodynamically stable and in no acute distress.  Patient able to ambulate in department prior to ED.  Evaluation does not show acute pathology that would require ongoing or additional emergent interventions while in the emergency department or further inpatient treatment.  I have discussed the diagnosis with the patient and answered all questions.  Pain is been managed  while in the emergency department and patient has no further complaints prior to discharge.  Patient is comfortable with plan discussed in room and is stable for discharge at this time.  I have discussed strict return precautions for returning to the emergency department.  Patient was encouraged to follow-up with PCP/specialist refer to at discharge.    MDM Rules/Calculators/A&P                       Final Clinical Impression(s) / ED Diagnoses Final diagnoses:  Left leg pain    Rx / DC Orders ED Discharge Orders         Ordered    naproxen (NAPROSYN) 500 MG tablet  2 times daily     08/16/19 0936    lidocaine (LIDODERM) 5 %  Every 24 hours     08/16/19 0936           Danial Sisley A, [name] 08/16/19 [id number]    Sherwood Gambler, MD 08/17/19 1644

## 2019-10-29 ENCOUNTER — Ambulatory Visit (INDEPENDENT_AMBULATORY_CARE_PROVIDER_SITE_OTHER): Payer: 59

## 2019-10-29 ENCOUNTER — Other Ambulatory Visit: Payer: Self-pay

## 2019-10-29 ENCOUNTER — Ambulatory Visit (INDEPENDENT_AMBULATORY_CARE_PROVIDER_SITE_OTHER): Payer: 59 | Admitting: Podiatry

## 2019-10-29 ENCOUNTER — Other Ambulatory Visit: Payer: Self-pay | Admitting: Podiatry

## 2019-10-29 DIAGNOSIS — S82832D Other fracture of upper and lower end of left fibula, subsequent encounter for closed fracture with routine healing: Secondary | ICD-10-CM | POA: Diagnosis not present

## 2019-10-29 DIAGNOSIS — M79672 Pain in left foot: Secondary | ICD-10-CM

## 2019-10-29 DIAGNOSIS — D3610 Benign neoplasm of peripheral nerves and autonomic nervous system, unspecified: Secondary | ICD-10-CM

## 2019-10-29 DIAGNOSIS — G5762 Lesion of plantar nerve, left lower limb: Secondary | ICD-10-CM

## 2019-10-29 DIAGNOSIS — M7750 Other enthesopathy of unspecified foot: Secondary | ICD-10-CM

## 2019-12-12 NOTE — Progress Notes (Signed)
  Subjective:  Patient ID: Samantha Banks, female    DOB: [birth year],  MRN: [id number]  Chief Complaint  Patient presents with  . Foot Pain    Left lateral ankle pain and 2-3 digit pain, 3 week duration, no known injuries.     59 y.o. female presents with the above complaint. History confirmed with patient.   Objective:  Physical Exam: warm, good capillary refill, no trophic changes or ulcerative lesions, normal DP and PT pulses and normal sensory exam. Left Foot: Pain palpation of the left second space with Mulder's click, pain to palpation about the left lateral ankle about the fibula  No images are attached to the encounter.  Radiographs: X-ray of left foot/ankle: no fracture, dislocation, swelling or degenerative changes noted Assessment:   1. Morton neuroma, left    Plan:  Patient was evaluated and treated and all questions answered.  Morton Neuroma -Educated on etiology -Educated on padding and proper shoegear -XR reviewed with patient -Injection delivered to the affected interspaces  Procedure: Neuroma Injection Location: Left 2nd interspace Skin Prep: Alcohol. Injectate: 0.5 cc 0.5% marcaine plain, 0.5 cc dexamethasone phosphate. Disposition: Patient tolerated procedure well. Injection site dressed with a band-aid.  History of left ankle fracture -Likely some continued pain as she fractured her ankle 3 months ago.  Likely should resolve with time  Return in about 3 weeks (around 11/19/2019) for Neuroma.

## 2019-12-16 ENCOUNTER — Other Ambulatory Visit: Payer: Self-pay

## 2019-12-16 ENCOUNTER — Ambulatory Visit (INDEPENDENT_AMBULATORY_CARE_PROVIDER_SITE_OTHER): Payer: 59 | Admitting: Podiatry

## 2019-12-16 ENCOUNTER — Encounter: Payer: Self-pay | Admitting: Podiatry

## 2019-12-16 ENCOUNTER — Ambulatory Visit (INDEPENDENT_AMBULATORY_CARE_PROVIDER_SITE_OTHER): Payer: 59

## 2019-12-16 DIAGNOSIS — S9032XA Contusion of left foot, initial encounter: Secondary | ICD-10-CM

## 2019-12-16 DIAGNOSIS — S90122A Contusion of left lesser toe(s) without damage to nail, initial encounter: Secondary | ICD-10-CM

## 2019-12-16 NOTE — Patient Instructions (Signed)
Foot Contusion A foot contusion is a deep bruise to the foot. Deep bruises happen when an injury causes bleeding under the skin. The skin over the bruise may turn blue, purple, or yellow. Minor injuries will cause a bruise that is painless. Deep bruises that are worse may stay painful and swollen for a few weeks. What are the causes? This condition is most often caused by a hard hit or direct force to your foot, such as having a heavy object fall on your foot. What are the signs or symptoms?   Swelling of the foot.  Pain and tenderness of the foot.  Changes in the color of the foot. The area may have redness and then turn blue, purple, or yellow. How is this treated? In general, the best treatment for a foot contusion is rest, ice, pressure (compression), and elevation. This is often called RICE therapy. An elastic wrap may be recommended to support your foot.  Your doctor may also suggest over-the-counter medicines for pain control. If your swelling or pain is very bad, you may be given crutches. Follow these instructions at home: RICE therapy   Rest the injured area. Try to avoid standing or walking while your foot hurts.  If told, put ice on the injured area: ? Put ice in a plastic bag. ? Place a towel between your skin and the bag. ? Leave the ice on for 20 minutes, 2-3 times a day.  If told, put light pressure on the injured area using an elastic wrap. ? Make sure the wrap is not too tight. If your toes turn numb, cold, or blue, take the wrap off and put it back on more loosely. ? Remove and put the wrap back on as told by your doctor.  Raise (elevate) the injured area above the level of your heart while you are sitting or lying down. General instructions  Take over-the-counter and prescription medicines only as told by your doctor.  Use crutches as told by your doctor, if this applies.  Do not use any products that contain nicotine or tobacco, such as cigarettes,  e-cigarettes, and chewing tobacco. These can delay healing. If you need help quitting, ask your doctor.  Keep all follow-up visits as told by your doctor. This is important. Contact a doctor if:  Your symptoms do not get better after many days of treatment.  You have redness, swelling, or pain in your foot or toes.  You have trouble moving the injured area.  Medicine does not help your swelling or pain. Get help right away if:  You have very bad pain.  Your foot or toes are numb.  Your foot or toes turn very light (pale) or cold.  You cannot move your foot or ankle.  Your foot feels warm when you touch it. Summary  A foot contusion is a deep bruise to the foot.  This condition is most often caused by a hard hit or direct force to your foot.  Symptoms include swelling, pain, and color changes in the injured area.  In general, the best treatment for a foot contusion is rest, ice, pressure (compression), and elevation. This information is not intended to replace advice given to you by your health care provider. Make sure you discuss any questions you have with your health care provider. Document Revised: 01/29/2018 Document Reviewed: 01/29/2018 Elsevier Patient Education  2020 [employer].

## 2019-12-16 NOTE — Progress Notes (Signed)
°  Subjective:  Patient ID: Samantha Banks, female    DOB: [birth year],  MRN: [id number]  Chief Complaint  Patient presents with   Toe Injury    L 4th. x2 days. Pt stated, "I stubbed my toe on a wooden chair. It was very painful. Occasional 6/10 pain - it has improved".    59 y.o. female presents with the above complaint. History confirmed with patient.  Toe started to feel better but she wanted to be evaluated and have an x-ray to make sure the toe was not broken.  No treatment thus far  Objective:  Physical Exam: warm, good capillary refill, no trophic changes or ulcerative lesions, normal DP and PT pulses and normal sensory exam. Left Foot: Fourth toe no pain on palpation, normal range of motion, no ecchymosis, minimal edema Radiographs: X-ray of the left foot: no fracture, dislocation, swelling or degenerative changes noted, joint spaces well aligned in the interphalangeal joints and metatarsophalangeal joint of the fourth toe Assessment:   1. Contusion of left foot including toes, initial encounter      Plan:  Patient was evaluated and treated and all questions answered.  Discussed contusion of the toe and treatment for this including rice protocol.  Reviewed x-rays with patient.  She does not have digital fracture.  Advised her that she may feel more stable if she buddy splints the toes together.  Advised to wear stiff shoes.  I completed this today in the office and showed her how to do it as well.   Lanae Crumbly, DPM 12/16/2019     Return if symptoms worsen or fail to improve.

## 2020-02-29 DIAGNOSIS — R413 Other amnesia: Secondary | ICD-10-CM | POA: Insufficient documentation

## 2020-05-21 ENCOUNTER — Other Ambulatory Visit: Payer: Self-pay

## 2020-05-21 ENCOUNTER — Encounter (HOSPITAL_COMMUNITY): Payer: Self-pay | Admitting: Emergency Medicine

## 2020-05-21 ENCOUNTER — Emergency Department (HOSPITAL_COMMUNITY): Payer: 59

## 2020-05-21 ENCOUNTER — Emergency Department (HOSPITAL_COMMUNITY)
Admission: EM | Admit: 2020-05-21 | Discharge: 2020-05-21 | Disposition: A | Discharge status: Home / Self Care | Payer: 59 | Attending: Emergency Medicine | Admitting: Emergency Medicine

## 2020-05-21 DIAGNOSIS — R059 Cough, unspecified: Secondary | ICD-10-CM | POA: Diagnosis present

## 2020-05-21 DIAGNOSIS — R079 Chest pain, unspecified: Secondary | ICD-10-CM | POA: Diagnosis not present

## 2020-05-21 DIAGNOSIS — Z91040 Latex allergy status: Secondary | ICD-10-CM | POA: Diagnosis not present

## 2020-05-21 DIAGNOSIS — F419 Anxiety disorder, unspecified: Secondary | ICD-10-CM | POA: Diagnosis not present

## 2020-05-21 DIAGNOSIS — Z20822 Contact with and (suspected) exposure to covid-19: Secondary | ICD-10-CM | POA: Diagnosis not present

## 2020-05-21 DIAGNOSIS — Z79899 Other long term (current) drug therapy: Secondary | ICD-10-CM | POA: Insufficient documentation

## 2020-05-21 DIAGNOSIS — J069 Acute upper respiratory infection, unspecified: Secondary | ICD-10-CM | POA: Diagnosis not present

## 2020-05-21 LAB — BASIC METABOLIC PANEL
Anion gap: 12 (ref 5–15)
BUN: 6 mg/dL (ref 6–20)
CO2: 25 mmol/L (ref 22–32)
Calcium: 9.1 mg/dL (ref 8.9–10.3)
Chloride: 101 mmol/L (ref 98–111)
Creatinine, Ser: 0.64 mg/dL (ref 0.44–1.00)
GFR, Estimated: >60 mL/min (ref >60)
Glucose, Bld: 115 mg/dL — ABNORMAL HIGH (ref 70–99)
Potassium: 4 mmol/L (ref 3.5–5.1)
Sodium: 138 mmol/L (ref 135–145)

## 2020-05-21 LAB — CBC
HCT: 46.3 % — ABNORMAL HIGH (ref 36.0–46.0)
Hemoglobin: 14.3 g/dL (ref 12.0–15.0)
MCH: 28.8 pg (ref 26.0–34.0)
MCHC: 30.9 g/dL (ref 30.0–36.0)
MCV: 93.2 fL (ref 80.0–100.0)
Platelets: 210 10*3/uL (ref 150–400)
RBC: 4.97 MIL/uL (ref 3.87–5.11)
RDW: 13.9 % (ref 11.5–15.5)
WBC: 8.4 10*3/uL (ref 4.0–10.5)
nRBC: 0 % (ref 0.0–0.2)

## 2020-05-21 LAB — RESP PANEL BY RT-PCR (FLU A&B, COVID) ARPGX2
Influenza A by PCR: NEGATIVE
Influenza B by PCR: NEGATIVE
SARS Coronavirus 2 by RT PCR: NEGATIVE

## 2020-05-21 LAB — TROPONIN I (HIGH SENSITIVITY)
Troponin I (High Sensitivity): <2 ng/L (ref <18)
Troponin I (High Sensitivity): <2 ng/L (ref <18)

## 2020-05-21 MED ORDER — BENZONATATE 100 MG PO CAPS: 100.0000 mg | ORAL_CAPSULE | Freq: Three times a day (TID) | ORAL | 0 refills | Status: DC | PRN

## 2020-05-21 MED ORDER — ACETAMINOPHEN 325 MG PO TABS
650.0000 mg | ORAL_TABLET | Freq: Once | ORAL | Status: AC | PRN
  Administered 2020-05-21: 650 mg via ORAL
  Filled 2020-05-21: qty 2

## 2020-05-21 MED ORDER — SODIUM CHLORIDE 0.9 % IV BOLUS
1000.0000 mL | Freq: Once | INTRAVENOUS | Status: AC
  Administered 2020-05-21: 1000 mL via INTRAVENOUS

## 2020-05-21 MED ORDER — ACETAMINOPHEN 325 MG PO TABS
650.0000 mg | ORAL_TABLET | Freq: Once | ORAL | Status: AC
  Administered 2020-05-21: 650 mg via ORAL
  Filled 2020-05-21: qty 2

## 2020-05-21 NOTE — Discharge Instructions (Addendum)
Like we discussed, I think your symptoms are likely related to a viral upper respiratory infection. You can continue to take over-the-counter medications for your symptoms. I would recommend Tylenol and ibuprofen for management of your fevers. You can alternate between these 2 medications. Please follow the instructions on the bottle for dosing.  I prescribed you a medication called Tessalon Perles. You can take these up to 3 times per day for your cough.  If your symptoms worsen, you can always return to the emergency department for reevaluation. It was a pleasure to meet you.

## 2020-05-21 NOTE — ED Notes (Signed)
Patient resting in bed, no issues noted.   VSS

## 2020-05-21 NOTE — ED Notes (Signed)
Patient c/o chest congestion and tighness x 1 week. C/o productive cough yellow colored sputum x 2 day.

## 2020-05-21 NOTE — ED Notes (Signed)
ambulatory to BR with no assist.

## 2020-05-21 NOTE — ED Notes (Signed)
Discharged with scripts  IV removed  DC instructions verbalized understanding.   Ambulatory to [facility]

## 2020-05-21 NOTE — ED Provider Notes (Signed)
[place] EMERGENCY DEPARTMENT Provider Note   CSN: [id number] Arrival date & time: 05/21/20  0254     History Chief Complaint  Patient presents with  . Chest Congestion / Chest Tightness    Samantha Banks is a 59 y.o. female.  HPI   Patient is a 59 year old female with medical history as noted below.  She presented to the emergency department with a multitude of symptoms that started 2 days ago.  She complains of productive cough with yellow/green sputum, mild shortness of breath, congestion, chest pain when coughing.  She is not a smoker.  She has been vaccinated for COVID-19.  She has not been vaccinated for the flu.  Not anticoagulated.  Denies abdominal pain, nausea, vomiting, diarrhea, urinary changes, leg swelling.     Past Medical History:  Diagnosis Date  . Allergic rhinitis   . Arthritis   . Headache    chronic migraines   . No pertinent past medical history     Patient Active Problem List   Diagnosis Date Noted  . Atypical chest pain 01/23/2019  . Arthritis 01/22/2017  . Swelling of limb 01/22/2017  . Pain in limb 01/22/2017  . Chronic tension-type headache, not intractable 01/09/2016  . Anxiety state 01/05/2016  . Lumbar radiculopathy 01/05/2016  . Migraine without aura and without status migrainosus, not intractable 01/05/2016  . Paresthesia 01/05/2016  . Vocal cord dysfunction 01/05/2016  . Chronic migraine 04/13/2015  . Seasonal allergic rhinitis due to pollen 03/30/2015  . Dyspnea 09/20/2014  . Chest wall pain 09/20/2014    Past Surgical History:  Procedure Laterality Date  . CESAREAN SECTION    . COLONOSCOPY WITH PROPOFOL N/A 12/17/2016   Procedure: COLONOSCOPY WITH PROPOFOL;  Surgeon: Lollie Sails, MD;  Location: [facility] ENDOSCOPY;  Service: Endoscopy;  Laterality: N/A;     OB History    Gravida  3   Para  3   Term  3   Preterm  0   AB  0   Living  3     SAB  0   IAB  0   Ectopic  0   Multiple  0    Live Births              Family History  Problem Relation Age of Onset  . Throat cancer Mother   . Osteoarthritis Mother   . Hypertension Father   . Diabetes Mellitus II Sister   . Asthma Sister   . Other Neg Hx   . Stomach cancer Neg Hx   . Pancreatic cancer Neg Hx   . Colon cancer Neg Hx     Social History   Tobacco Use  . Smoking status: Never Smoker  . Smokeless tobacco: Never Used  Vaping Use  . Vaping Use: Never used  Substance Use Topics  . Alcohol use: No    Alcohol/week: 0.0 standard drinks  . Drug use: No    Home Medications Prior to Admission medications   Medication Sig Start Date End Date Taking? Authorizing Provider  acetaminophen (TYLENOL) 500 MG tablet Take 1,000 mg by mouth every 6 (six) hours as needed for moderate pain or headache.    [provider]  cholecalciferol (VITAMIN D3) 25 MCG (1000 UNIT) tablet Take 1,000 Units by mouth daily.    [provider]  lidocaine (LIDODERM) 5 % Place 1 patch onto the skin daily. Remove & Discard patch within 12 hours or as directed by MD 08/16/19  Henderly, Britni A, [name]  Multiple Vitamin (MULTI-VITAMIN) tablet Take by mouth.    [provider]  Multiple Vitamin (MULTIVITAMIN ADULT PO) Take by mouth.    [provider]  naproxen (NAPROSYN) 500 MG tablet Take 1 tablet (500 mg total) by mouth 2 (two) times daily. 08/16/19   Henderly, Britni A, [name]  sertraline (ZOLOFT) 25 MG tablet Take by mouth. 10/06/19 01/04/20  [provider]    Allergies    Latex and Contrast media [iodinated diagnostic agents]  Review of Systems   Review of Systems  All other systems reviewed and are negative. Ten systems reviewed and are negative for acute change, except as noted in the HPI.   Physical Exam Updated Vital Signs BP 131/72 (BP Location: Left Arm)   Pulse 92   Temp 99.1 F (37.3 C) (Oral)   Resp 16   Ht 4\' 11"  (1.499 m)   Wt 75 kg   LMP 02/01/2012   SpO2 100%   BMI  33.40 kg/m   Physical Exam Vitals and nursing note reviewed.  Constitutional:      General: She is not in acute distress.    Appearance: Normal appearance. She is normal weight. She is not ill-appearing, toxic-appearing or diaphoretic.  HENT:     Head: Normocephalic and atraumatic.     Right Ear: External ear normal.     Left Ear: External ear normal.     Nose: Congestion present.     Mouth/Throat:     Mouth: Mucous membranes are moist.     Pharynx: Oropharynx is clear. No oropharyngeal exudate or posterior oropharyngeal erythema.  Eyes:     General: No scleral icterus.       Right eye: No discharge.        Left eye: No discharge.     Extraocular Movements: Extraocular movements intact.     Conjunctiva/sclera: Conjunctivae normal.  Cardiovascular:     Rate and Rhythm: Normal rate and regular rhythm.     Pulses: Normal pulses.     Heart sounds: Normal heart sounds. No murmur heard. No friction rub. No gallop.   Pulmonary:     Effort: Pulmonary effort is normal. No respiratory distress.     Breath sounds: No stridor. Rhonchi present. No wheezing or rales.     Comments: Diffuse rhonchi that is cleared with coughing. Abdominal:     General: Abdomen is flat.     Palpations: Abdomen is soft.     Tenderness: There is no abdominal tenderness.  Musculoskeletal:        General: Normal range of motion.     Cervical back: Normal range of motion and neck supple. No tenderness.  Skin:    General: Skin is warm and dry.  Neurological:     General: No focal deficit present.     Mental Status: She is alert and oriented to person, place, and time.  Psychiatric:        Mood and Affect: Mood normal.        Behavior: Behavior normal.    ED Results / Procedures / Treatments   Labs (all labs ordered are listed, but only abnormal results are displayed) Labs Reviewed  BASIC METABOLIC PANEL - Abnormal; Notable for the following components:      Result Value   Glucose, Bld 115 (*)    All  other components within normal limits  CBC - Abnormal; Notable for the following components:   HCT 46.3 (*)    All other  components within normal limits  RESP PANEL BY RT-PCR (FLU A&B, COVID) [id number]  TROPONIN I (HIGH SENSITIVITY)  TROPONIN I (HIGH SENSITIVITY)    EKG EKG Interpretation  Date/Time:  Saturday May 21 2020 03:07:20 EST Ventricular Rate:  84 PR Interval:  156 QRS Duration: 72 QT Interval:  356 QTC Calculation: 420 R Axis:   81 Text Interpretation: Normal sinus rhythm Normal ECG No significant change since last tracing Confirmed by Blanchie Dessert [phone]) on 05/21/2020 12:59:59 PM  Radiology DG Chest 2 View  Result Date: 05/21/2020 CLINICAL DATA:  Chest tightness EXAM: CHEST - 2 VIEW COMPARISON:  Chest x-ray dated 01/18/2017 FINDINGS: The heart size and mediastinal contours are within normal limits. Both lungs are clear. The visualized skeletal structures are unremarkable. IMPRESSION: No active cardiopulmonary disease. Electronically Signed   By: Constance Holster M.D.   On: 05/21/2020 03:52    Procedures Procedures (including critical care time)  Medications Ordered in ED Medications  acetaminophen (TYLENOL) tablet 650 mg (650 mg Oral Given 05/21/20 0821)  sodium chloride 0.9 % bolus 1,000 mL (1,000 mLs Intravenous New Bag/Given 05/21/20 1031)  acetaminophen (TYLENOL) tablet 650 mg (650 mg Oral Given 05/21/20 1021)   ED Course  I have reviewed the triage vital signs and the nursing notes.  Pertinent labs & imaging results that were available during my care of the patient were reviewed by me and considered in my medical decision making (see chart for details).    MDM Rules/Calculators/A&P                          Patient is a 59 year old female who presents with a multitude of symptoms consistent with a viral URI.  Patient has been vaccinated for COVID-19.  Physical exam is generally reassuring. Heart is regular rate and rhythm without murmurs, rubs,  or gallops.'s are clear to auscultation bilaterally. Chest x-ray is negative. Doubt pneumonia at this time. Patient has had 2 troponins which were both less than 2. Respiratory panel is negative. CBC without leukocytosis. Electrolytes within normal limits on BMP.  Patient given IV fluids as well as 2 doses of Tylenol. Patient was initially febrile but this has resolved with Tylenol. Discussed use of over-the-counter medications for management of her symptoms. Return to the ED with new or worsening symptoms. Her questions were answered and she was amicable at the time of discharge. Her vital signs are stable.  Note: Portions of this report may have been transcribed using voice recognition software. Every effort was made to ensure accuracy; however, inadvertent computerized transcription errors may be present.   Final Clinical Impression(s) / ED Diagnoses Final diagnoses:  Viral URI with cough   Rx / DC Orders ED Discharge Orders         Ordered    benzonatate (TESSALON) 100 MG capsule  3 times daily PRN        05/21/20 1301           Rayna Sexton, [name] 05/21/20 1301    Margette Fast, MD 05/22/20 [phone]

## 2020-05-21 NOTE — ED Triage Notes (Signed)
Patient reports chest congestion with chest tightness/cough and diaphoresis onset this week .

## 2020-08-19 ENCOUNTER — Encounter (HOSPITAL_COMMUNITY): Payer: Self-pay | Admitting: Emergency Medicine

## 2020-08-19 ENCOUNTER — Emergency Department (HOSPITAL_COMMUNITY)
Admission: EM | Admit: 2020-08-19 | Discharge: 2020-08-20 | Disposition: A | Discharge status: Home / Self Care | Payer: Self-pay | Attending: Emergency Medicine | Admitting: Emergency Medicine

## 2020-08-19 DIAGNOSIS — K5790 Diverticulosis of intestine, part unspecified, without perforation or abscess without bleeding: Secondary | ICD-10-CM | POA: Insufficient documentation

## 2020-08-19 DIAGNOSIS — I880 Nonspecific mesenteric lymphadenitis: Secondary | ICD-10-CM

## 2020-08-19 DIAGNOSIS — R197 Diarrhea, unspecified: Secondary | ICD-10-CM

## 2020-08-19 DIAGNOSIS — Z91040 Latex allergy status: Secondary | ICD-10-CM | POA: Insufficient documentation

## 2020-08-19 LAB — URINALYSIS, ROUTINE W REFLEX MICROSCOPIC
Bilirubin Urine: NEGATIVE
Glucose, UA: NEGATIVE mg/dL
Hgb urine dipstick: NEGATIVE
Ketones, ur: 80 mg/dL — AB
Leukocytes,Ua: NEGATIVE
Nitrite: NEGATIVE
Protein, ur: NEGATIVE mg/dL
Specific Gravity, Urine: 1.021 (ref 1.005–1.030)
pH: 5 (ref 5.0–8.0)

## 2020-08-19 LAB — COMPREHENSIVE METABOLIC PANEL
ALT: 29 U/L (ref 0–44)
AST: 27 U/L (ref 15–41)
Albumin: 4 g/dL (ref 3.5–5.0)
Alkaline Phosphatase: 70 U/L (ref 38–126)
Anion gap: 9 (ref 5–15)
BUN: 11 mg/dL (ref 6–20)
CO2: 25 mmol/L (ref 22–32)
Calcium: 8.8 mg/dL — ABNORMAL LOW (ref 8.9–10.3)
Chloride: 103 mmol/L (ref 98–111)
Creatinine, Ser: 0.69 mg/dL (ref 0.44–1.00)
GFR, Estimated: >60 mL/min (ref >60)
Glucose, Bld: 84 mg/dL (ref 70–99)
Potassium: 3.5 mmol/L (ref 3.5–5.1)
Sodium: 137 mmol/L (ref 135–145)
Total Bilirubin: 0.5 mg/dL (ref 0.3–1.2)
Total Protein: 7.1 g/dL (ref 6.5–8.1)

## 2020-08-19 LAB — CBC
HCT: 46.3 % — ABNORMAL HIGH (ref 36.0–46.0)
Hemoglobin: 14.6 g/dL (ref 12.0–15.0)
MCH: 29.3 pg (ref 26.0–34.0)
MCHC: 31.5 g/dL (ref 30.0–36.0)
MCV: 92.8 fL (ref 80.0–100.0)
Platelets: 307 10*3/uL (ref 150–400)
RBC: 4.99 MIL/uL (ref 3.87–5.11)
RDW: 14.1 % (ref 11.5–15.5)
WBC: 9.9 10*3/uL (ref 4.0–10.5)
nRBC: 0 % (ref 0.0–0.2)

## 2020-08-19 LAB — LIPASE, BLOOD: Lipase: 30 U/L (ref 11–51)

## 2020-08-19 NOTE — ED Provider Notes (Signed)
[facility] EMERGENCY DEPARTMENT Provider Note   CSN: [id number] Arrival date & time: 08/19/20  1803   History Chief Complaint  Patient presents with  . Abdominal Pain    Samantha Banks is a 60 y.o. female.  The history is provided by the patient.  Abdominal Pain She comes in complaining of generalized abdominal pain for the last 3-4 days.  Pain got worse 2 days ago.  She rates pain at 8/10.  Nothing makes it better, nothing makes it worse.  There is no radiation of pain.  She has had diarrhea with 2 bowel movements a day.  She has had some nausea and did force herself to throw up once without any improvement.  She denies fever, chills, sweats.  Family members had a stomach virus with symptoms present about a week before patient got sick.  Past Medical History:  Diagnosis Date  . Allergic rhinitis   . Arthritis   . Headache    chronic migraines   . No pertinent past medical history     Patient Active Problem List   Diagnosis Date Noted  . Atypical chest pain 01/23/2019  . Arthritis 01/22/2017  . Swelling of limb 01/22/2017  . Pain in limb 01/22/2017  . Chronic tension-type headache, not intractable 01/09/2016  . Anxiety state 01/05/2016  . Lumbar radiculopathy 01/05/2016  . Migraine without aura and without status migrainosus, not intractable 01/05/2016  . Paresthesia 01/05/2016  . Vocal cord dysfunction 01/05/2016  . Chronic migraine 04/13/2015  . Seasonal allergic rhinitis due to pollen 03/30/2015  . Dyspnea 09/20/2014  . Chest wall pain 09/20/2014    Past Surgical History:  Procedure Laterality Date  . CESAREAN SECTION    . COLONOSCOPY WITH PROPOFOL N/A 12/17/2016   Procedure: COLONOSCOPY WITH PROPOFOL;  Surgeon: Lollie Sails, MD;  Location: [facility] ENDOSCOPY;  Service: Endoscopy;  Laterality: N/A;     OB History    Gravida  3   Para  3   Term  3   Preterm  0   AB  0   Living  3     SAB  0   IAB  0   Ectopic  0    Multiple  0   Live Births              Family History  Problem Relation Age of Onset  . Throat cancer Mother   . Osteoarthritis Mother   . Hypertension Father   . Diabetes Mellitus II Sister   . Asthma Sister   . Other Neg Hx   . Stomach cancer Neg Hx   . Pancreatic cancer Neg Hx   . Colon cancer Neg Hx     Social History   Tobacco Use  . Smoking status: Never Smoker  . Smokeless tobacco: Never Used  Vaping Use  . Vaping Use: Never used  Substance Use Topics  . Alcohol use: No    Alcohol/week: 0.0 standard drinks  . Drug use: No    Home Medications Prior to Admission medications   Medication Sig Start Date End Date Taking? Authorizing Provider  acetaminophen (TYLENOL) 500 MG tablet Take 1,000 mg by mouth every 6 (six) hours as needed for moderate pain or headache.    [provider]  benzonatate (TESSALON) 100 MG capsule Take 1 capsule (100 mg total) by mouth 3 (three) times daily as needed for cough. 05/21/20   Rayna Sexton, [name]  cholecalciferol (VITAMIN D3) 25 MCG (1000 UNIT) tablet Take  1,000 Units by mouth daily.    [provider]  lidocaine (LIDODERM) 5 % Place 1 patch onto the skin daily. Remove & Discard patch within 12 hours or as directed by MD 08/16/19   Henderly, Britni A, [name]  Multiple Vitamin (MULTI-VITAMIN) tablet Take by mouth.    [provider]  Multiple Vitamin (MULTIVITAMIN ADULT PO) Take by mouth.    [provider]  naproxen (NAPROSYN) 500 MG tablet Take 1 tablet (500 mg total) by mouth 2 (two) times daily. 08/16/19   Henderly, Britni A, [name]  sertraline (ZOLOFT) 25 MG tablet Take by mouth. 10/06/19 01/04/20  [provider]    Allergies    Latex and Contrast media [iodinated diagnostic agents]  Review of Systems   Review of Systems  Gastrointestinal: Positive for abdominal pain.  All other systems reviewed and are negative.   Physical Exam Updated Vital Signs BP 110/61 (BP Location: Left  Arm)   Pulse 73   Temp 99 F (37.2 C)   Resp 16   LMP 02/01/2012   SpO2 100%   Physical Exam Vitals and nursing note reviewed.   60 year old female, appears uncomfortable, but is in no acute distress. Vital signs are normal. Oxygen saturation is 100%, which is normal. Head is normocephalic and atraumatic. PERRLA, EOMI. Oropharynx is clear. Neck is nontender and supple without adenopathy or JVD. Back is nontender and there is no CVA tenderness. Lungs are clear without rales, wheezes, or rhonchi. Chest is nontender. Heart has regular rate and rhythm without murmur. Abdomen is soft, with mild to moderate tenderness diffusely.  There is no area of maximum tenderness.  There is no rebound or guarding.  There are no masses or hepatosplenomegaly and peristalsis is hypoactive. Extremities have no cyanosis or edema, full range of motion is present. Skin is warm and dry without rash. Neurologic: Mental status is normal, cranial nerves are intact, there are no motor or sensory deficits.  ED Results / Procedures / Treatments   Labs (all labs ordered are listed, but only abnormal results are displayed) Labs Reviewed  COMPREHENSIVE METABOLIC PANEL - Abnormal; Notable for the following components:      Result Value   Calcium 8.8 (*)    All other components within normal limits  CBC - Abnormal; Notable for the following components:   HCT 46.3 (*)    All other components within normal limits  URINALYSIS, ROUTINE W REFLEX MICROSCOPIC - Abnormal; Notable for the following components:   Ketones, ur 80 (*)    All other components within normal limits  LIPASE, BLOOD   Radiology CT ABDOMEN PELVIS WO CONTRAST  Result Date: 08/20/2020 CLINICAL DATA:  Diarrhea, abdominal pain for 1 week EXAM: CT ABDOMEN AND PELVIS WITHOUT CONTRAST TECHNIQUE: Multidetector CT imaging of the abdomen and pelvis was performed following the standard protocol without IV contrast. COMPARISON:  07/04/2019 FINDINGS: Lower  chest: No acute pleural or parenchymal lung disease. Unenhanced CT was performed per clinician order. Lack of IV contrast limits sensitivity and specificity, especially for evaluation of abdominal/pelvic solid viscera. Hepatobiliary: Unenhanced imaging of the liver and gallbladder demonstrate no focal abnormalities. Pancreas: Unremarkable. No pancreatic ductal dilatation or surrounding inflammatory changes. Spleen: Normal in size without focal abnormality. Adrenals/Urinary Tract: Adrenal glands are unremarkable. Kidneys are normal, without renal calculi, focal lesion, or hydronephrosis. Bladder is unremarkable. Stomach/Bowel: No bowel obstruction or ileus. Minimal diverticulosis of the descending colon with no evidence of acute diverticulitis. Normal gas-filled retrocecal appendix. No bowel wall  thickening or inflammatory change. Vascular/Lymphatic: No significant vascular findings are present. No pathologically enlarged lymph nodes. There are numerous subcentimeter lymph nodes within the central mesentery, which could reflect mesenteric adenitis or gastroenteritis. Reproductive: Uterus and bilateral adnexa are unremarkable. Other: No free fluid or free gas.  No abdominal wall hernia. Musculoskeletal: No acute or destructive bony lesions. Reconstructed images demonstrate no additional findings. IMPRESSION: 1. Multiple subcentimeter central mesenteric lymph nodes, which may be sequela of enteritis or mesenteric adenitis. 2. Descending colonic diverticulosis without diverticulitis. 3. Otherwise unremarkable CT of the abdomen and pelvis. Electronically Signed   By: Randa Ngo M.D.   On: 08/20/2020 02:26    Procedures Procedures   Medications Ordered in ED Medications  lactated ringers bolus 1,000 mL (1,000 mLs Intravenous New Bag/Given 08/20/20 0132)  ondansetron (ZOFRAN) injection 4 mg (4 mg Intravenous Given 08/20/20 0117)  morphine 4 MG/ML injection 4 mg (4 mg Intravenous Given 08/20/20 0120)    ED  Course  I have reviewed the triage vital signs and the nursing notes.  Pertinent labs & imaging results that were available during my care of the patient were reviewed by me and considered in my medical decision making (see chart for details).  MDM Rules/Calculators/A&P Abdominal pain of uncertain cause.  Possible simple gastroenteritis.  Labs are reassuring.  However, pattern is somewhat atypical for gastroenteritis.  Consider diverticulitis.  Doubt appendicitis or pancreatitis.  Urinalysis shows no evidence of UTI.  She will be given IV fluids and sent for CT of abdomen and pelvis.  Old records reviewed, and she does have prior ED visits for abdominal pain in the past CT scans have been unremarkable.  CT shows diverticulosis without diverticulitis, also mesenteric adenitis.  Patient feels much better following above-noted treatment.  This likely was viral enteritis with secondary mesenteric adenitis.  She is discharged with a prescription for small number of hydrocodone-acetaminophen tablets, advised to return for worsening symptoms.  Final Clinical Impression(s) / ED Diagnoses Final diagnoses:  Mesenteric adenitis  Diarrhea of presumed infectious origin    Rx / DC Orders ED Discharge Orders         Ordered    HYDROcodone-acetaminophen (NORCO) 5-325 MG tablet  Every 4 hours PRN        08/20/20 8144           Delora Fuel, MD 81/85/63 [phone]

## 2020-08-19 NOTE — ED Triage Notes (Signed)
Patient complains of diarrhea and a a very deep abdominal ache that started earlier in the week. Patient alert, oriented, and in no apparent distress at this time.

## 2020-08-20 ENCOUNTER — Emergency Department (HOSPITAL_COMMUNITY): Payer: Self-pay

## 2020-08-20 MED ORDER — MORPHINE SULFATE (PF) 4 MG/ML IV SOLN
4.0000 mg | Freq: Once | INTRAVENOUS | Status: AC
  Administered 2020-08-20: 4 mg via INTRAVENOUS
  Filled 2020-08-20: qty 1

## 2020-08-20 MED ORDER — ONDANSETRON HCL 4 MG/2ML IJ SOLN
4.0000 mg | Freq: Once | INTRAMUSCULAR | Status: AC
  Administered 2020-08-20: 4 mg via INTRAVENOUS
  Filled 2020-08-20: qty 2

## 2020-08-20 MED ORDER — LACTATED RINGERS IV BOLUS
1000.0000 mL | Freq: Once | INTRAVENOUS | Status: AC
  Administered 2020-08-20: 1000 mL via INTRAVENOUS

## 2020-08-20 MED ORDER — HYDROCODONE-ACETAMINOPHEN 5-325 MG PO TABS: 1.0000 | ORAL_TABLET | ORAL | 0 refills | Status: DC | PRN

## 2020-08-20 NOTE — ED Notes (Signed)
Patient verbalized understanding of discharge instructions. Opportunity for questions and answers.  

## 2020-08-20 NOTE — ED Notes (Signed)
Patient transported to MRI 

## 2020-08-20 NOTE — Discharge Instructions (Addendum)
Take loperamide (Imodium A-D) as needed for diarrhea.  Take acetaminophen or ibuprofen as needed for pain.  For severe pain, you may take hydrocodone-acetaminophen.  Return if your symptoms seem to be getting worse.

## 2021-01-24 ENCOUNTER — Emergency Department (HOSPITAL_COMMUNITY)
Admission: EM | Admit: 2021-01-24 | Discharge: 2021-01-24 | Disposition: A | Discharge status: Home / Self Care | Payer: Self-pay | Attending: Emergency Medicine | Admitting: Emergency Medicine

## 2021-01-24 ENCOUNTER — Encounter (HOSPITAL_COMMUNITY): Payer: Self-pay | Admitting: Emergency Medicine

## 2021-01-24 ENCOUNTER — Other Ambulatory Visit: Payer: Self-pay

## 2021-01-24 ENCOUNTER — Emergency Department (HOSPITAL_COMMUNITY): Payer: Self-pay

## 2021-01-24 DIAGNOSIS — M791 Myalgia, unspecified site: Secondary | ICD-10-CM

## 2021-01-24 DIAGNOSIS — R531 Weakness: Secondary | ICD-10-CM

## 2021-01-24 DIAGNOSIS — Z91040 Latex allergy status: Secondary | ICD-10-CM | POA: Insufficient documentation

## 2021-01-24 DIAGNOSIS — R519 Headache, unspecified: Secondary | ICD-10-CM | POA: Insufficient documentation

## 2021-01-24 DIAGNOSIS — R42 Dizziness and giddiness: Secondary | ICD-10-CM | POA: Insufficient documentation

## 2021-01-24 DIAGNOSIS — R109 Unspecified abdominal pain: Secondary | ICD-10-CM | POA: Insufficient documentation

## 2021-01-24 DIAGNOSIS — Z20822 Contact with and (suspected) exposure to covid-19: Secondary | ICD-10-CM | POA: Insufficient documentation

## 2021-01-24 LAB — URINALYSIS, ROUTINE W REFLEX MICROSCOPIC
Bacteria, UA: NONE SEEN
Bilirubin Urine: NEGATIVE
Glucose, UA: NEGATIVE mg/dL
Ketones, ur: NEGATIVE mg/dL
Leukocytes,Ua: NEGATIVE
Nitrite: NEGATIVE
Protein, ur: NEGATIVE mg/dL
Specific Gravity, Urine: 1.006 (ref 1.005–1.030)
pH: 7 (ref 5.0–8.0)

## 2021-01-24 LAB — BASIC METABOLIC PANEL
Anion gap: 8 (ref 5–15)
BUN: 14 mg/dL (ref 6–20)
CO2: 28 mmol/L (ref 22–32)
Calcium: 9.1 mg/dL (ref 8.9–10.3)
Chloride: 105 mmol/L (ref 98–111)
Creatinine, Ser: 0.54 mg/dL (ref 0.44–1.00)
GFR, Estimated: >60 mL/min (ref >60)
Glucose, Bld: 115 mg/dL — ABNORMAL HIGH (ref 70–99)
Potassium: 4.1 mmol/L (ref 3.5–5.1)
Sodium: 141 mmol/L (ref 135–145)

## 2021-01-24 LAB — CBC
HCT: 43.7 % (ref 36.0–46.0)
Hemoglobin: 13.7 g/dL (ref 12.0–15.0)
MCH: 29.3 pg (ref 26.0–34.0)
MCHC: 31.4 g/dL (ref 30.0–36.0)
MCV: 93.6 fL (ref 80.0–100.0)
Platelets: 225 10*3/uL (ref 150–400)
RBC: 4.67 MIL/uL (ref 3.87–5.11)
RDW: 13.6 % (ref 11.5–15.5)
WBC: 10.6 10*3/uL — ABNORMAL HIGH (ref 4.0–10.5)
nRBC: 0 % (ref 0.0–0.2)

## 2021-01-24 LAB — CBG MONITORING, ED: Glucose-Capillary: 88 mg/dL (ref 70–99)

## 2021-01-24 LAB — I-STAT BETA HCG BLOOD, ED (MC, WL, AP ONLY): I-stat hCG, quantitative: <5 m[IU]/mL (ref <5)

## 2021-01-24 LAB — RESP PANEL BY RT-PCR (FLU A&B, COVID) ARPGX2
Influenza A by PCR: NEGATIVE
Influenza B by PCR: NEGATIVE
SARS Coronavirus 2 by RT PCR: NEGATIVE

## 2021-01-24 MED ORDER — IBUPROFEN 600 MG PO TABS: 600.0000 mg | ORAL_TABLET | Freq: Three times a day (TID) | ORAL | 0 refills | Status: DC

## 2021-01-24 MED ORDER — LACTATED RINGERS IV BOLUS
1000.0000 mL | Freq: Once | INTRAVENOUS | Status: AC
  Administered 2021-01-24: 1000 mL via INTRAVENOUS

## 2021-01-24 MED ORDER — KETOROLAC TROMETHAMINE 30 MG/ML IJ SOLN
30.0000 mg | Freq: Once | INTRAMUSCULAR | Status: AC
  Administered 2021-01-24: 30 mg via INTRAVENOUS
  Filled 2021-01-24: qty 1

## 2021-01-24 NOTE — ED Notes (Signed)
[id number] in ed patient will need pcp will call primary at [facility]  and make f/u appointment aND CALL PATIENT WITH INFORMATION.y care at [facility]

## 2021-01-24 NOTE — ED Notes (Signed)
Pt ambulated to the restroom gait steady with no assistance 

## 2021-01-24 NOTE — ED Notes (Signed)
Pt ambulatory in ED lobby. 

## 2021-01-24 NOTE — ED Triage Notes (Signed)
Pt complains of dizziness, headache, and generalized body aches x 3 days.

## 2021-01-24 NOTE — ED Notes (Signed)
Patient transported to CT 

## 2021-01-24 NOTE — ED Provider Notes (Signed)
  Physical Exam  BP 135/70   Pulse 62   Temp 97.7 F (36.5 C) (Oral)   Resp 15   Ht '4\' 11"'$  (1.499 m)   Wt 65.8 kg   LMP 02/01/2012   SpO2 97%   BMI 29.29 kg/m   Physical Exam Vitals and nursing note reviewed.  Constitutional:      General: She is not in acute distress.    Appearance: She is well-developed.  HENT:     Head: Normocephalic and atraumatic.  Eyes:     Conjunctiva/sclera: Conjunctivae normal.  Cardiovascular:     Rate and Rhythm: Normal rate and regular rhythm.     Heart sounds: No murmur heard. Pulmonary:     Effort: Pulmonary effort is normal. No respiratory distress.     Breath sounds: Normal breath sounds.  Abdominal:     Palpations: Abdomen is soft.     Tenderness: There is no abdominal tenderness.  Musculoskeletal:     Cervical back: Neck supple.     Comments: General myalgias, mild tenderness to multiple muscle groups  Skin:    General: Skin is warm and dry.  Neurological:     Mental Status: She is alert.    ED Course/Procedures     Procedures  MDM  Patient reevaluated by me after patient signed out by previous provider.  Patient states her symptoms are improved after Toradol administration.  CT abdomen pelvis unremarkable other than mild diverticulosis.  Patient is having no hematochezia has no evidence of anemia on laboratory evaluation.  She is safe for discharge at this time and was discharged with primary care follow-up.  Case management was contacted to help patient establish care with a primary care physician.       Teressa Lower, MD 01/24/21 [phone]

## 2021-01-24 NOTE — ED Provider Notes (Signed)
[facility] DEPT Provider Note   CSN: [id number] Arrival date & time: 01/24/21  0500     History Chief Complaint  Patient presents with  . Dizziness  . Generalized Body Aches  . Headache    Samantha Banks is a 60 y.o. female.   Dizziness Quality:  Lightheadedness Severity:  Mild Onset quality:  Gradual Duration:  2 days Timing:  Constant Progression:  Waxing and waning Chronicity:  New Context: not when bending over   Relieved by:  None tried Worsened by:  Nothing Ineffective treatments:  None tried Associated symptoms: headaches   Associated symptoms: no chest pain, no shortness of breath, no tinnitus and no vomiting   Headache Associated symptoms: abdominal pain (left sided) and dizziness   Associated symptoms: no vomiting       Past Medical History:  Diagnosis Date  . Allergic rhinitis   . Arthritis   . Headache    chronic migraines   . No pertinent past medical history     Patient Active Problem List   Diagnosis Date Noted  . Atypical chest pain 01/23/2019  . Arthritis 01/22/2017  . Swelling of limb 01/22/2017  . Pain in limb 01/22/2017  . Chronic tension-type headache, not intractable 01/09/2016  . Anxiety state 01/05/2016  . Lumbar radiculopathy 01/05/2016  . Migraine without aura and without status migrainosus, not intractable 01/05/2016  . Paresthesia 01/05/2016  . Vocal cord dysfunction 01/05/2016  . Chronic migraine 04/13/2015  . Seasonal allergic rhinitis due to pollen 03/30/2015  . Dyspnea 09/20/2014  . Chest wall pain 09/20/2014    Past Surgical History:  Procedure Laterality Date  . CESAREAN SECTION    . COLONOSCOPY WITH PROPOFOL N/A 12/17/2016   Procedure: COLONOSCOPY WITH PROPOFOL;  Surgeon: Lollie Sails, MD;  Location: [facility] ENDOSCOPY;  Service: Endoscopy;  Laterality: N/A;     OB History     Gravida  3   Para  3   Term  3   Preterm  0   AB  0   Living  3      SAB  0   IAB   0   Ectopic  0   Multiple  0   Live Births              Family History  Problem Relation Age of Onset  . Throat cancer Mother   . Osteoarthritis Mother   . Hypertension Father   . Diabetes Mellitus II Sister   . Asthma Sister   . Other Neg Hx   . Stomach cancer Neg Hx   . Pancreatic cancer Neg Hx   . Colon cancer Neg Hx     Social History   Tobacco Use  . Smoking status: Never  . Smokeless tobacco: Never  Vaping Use  . Vaping Use: Never used  Substance Use Topics  . Alcohol use: No    Alcohol/week: 0.0 standard drinks  . Drug use: No    Home Medications Prior to Admission medications   Medication Sig Start Date End Date Taking? Authorizing Provider  acetaminophen (TYLENOL) 500 MG tablet Take 1,000 mg by mouth every 6 (six) hours as needed for moderate pain or headache.    [provider]  cholecalciferol (VITAMIN D3) 25 MCG (1000 UNIT) tablet Take 1,000 Units by mouth daily.    [provider]  HYDROcodone-acetaminophen (NORCO) 5-325 MG tablet Take 1 tablet by mouth every 4 (four) hours as needed for moderate pain. 08/20/20  Delora Fuel, MD  lidocaine (LIDODERM) 5 % Place 1 patch onto the skin daily. Remove & Discard patch within 12 hours or as directed by MD 08/16/19   Henderly, Britni A, [name]  Multiple Vitamin (MULTI-VITAMIN) tablet Take by mouth.    [provider]  Multiple Vitamin (MULTIVITAMIN ADULT PO) Take by mouth.    [provider]  naproxen (NAPROSYN) 500 MG tablet Take 1 tablet (500 mg total) by mouth 2 (two) times daily. 08/16/19   Henderly, Britni A, [name]  sertraline (ZOLOFT) 25 MG tablet Take by mouth. 10/06/19 01/04/20  [provider]    Allergies    Latex and Contrast media [iodinated diagnostic agents]  Review of Systems   Review of Systems  HENT:  Negative for tinnitus.   Respiratory:  Negative for shortness of breath.   Cardiovascular:  Negative for chest pain.  Gastrointestinal:  Positive for  abdominal pain (left sided). Negative for vomiting.  Neurological:  Positive for dizziness and headaches.   Physical Exam Updated Vital Signs BP (!) 141/86 (BP Location: Left Arm)   Pulse 75   Temp 97.7 F (36.5 C) (Oral)   Resp 16   Ht '4\' 11"'$  (1.499 m)   Wt 65.8 kg   LMP 02/01/2012   SpO2 100%   BMI 29.29 kg/m   Physical Exam Vitals and nursing note reviewed.  Constitutional:      Appearance: She is well-developed.  HENT:     Head: Normocephalic and atraumatic.     Mouth/Throat:     Mouth: Mucous membranes are moist.     Pharynx: Oropharynx is clear.  Eyes:     Pupils: Pupils are equal, round, and reactive to light.  Cardiovascular:     Rate and Rhythm: Normal rate and regular rhythm.  Pulmonary:     Effort: No respiratory distress.     Breath sounds: No stridor.  Abdominal:     General: There is no distension.  Musculoskeletal:        General: No swelling or tenderness. Normal range of motion.     Cervical back: Normal range of motion.  Skin:    General: Skin is warm and dry.  Neurological:     General: No focal deficit present.     Mental Status: She is alert.    ED Results / Procedures / Treatments   Labs (all labs ordered are listed, but only abnormal results are displayed) Labs Reviewed  RESP PANEL BY RT-PCR (FLU A&B, COVID) ARPGX2  BASIC METABOLIC PANEL  CBC  URINALYSIS, ROUTINE W REFLEX MICROSCOPIC  CBG MONITORING, ED  I-STAT BETA HCG BLOOD, ED ([facility], [facility], [facility] ONLY)    EKG None  Radiology No results found.  Procedures Procedures   Medications Ordered in ED Medications  lactated ringers bolus 1,000 mL (has no administration in time range)    ED Course  I have reviewed the triage vital signs and the nursing notes.  Pertinent labs & imaging results that were available during my care of the patient were reviewed by me and considered in my medical decision making (see chart for details).  Clinical Course as of 01/25/21 0124  Tue Jan 24, 2021   0703 L flank pain, abdominal pain, weakness, inc frequency, pending CTAP [MK]    Clinical Course User Index [MK] Teressa Lower, MD   MDM Rules/Calculators/A&P                          Patient  has what appears to be some type of viral process however with left-sided abdominal pain and hematuria we will go and CT for stone.  If is negative patient can be discharged with supportive measures.  Care transferred pending same.  Final Clinical Impression(s) / ED Diagnoses Final diagnoses:  None    Rx / DC Orders ED Discharge Orders     None        Ashir Kunz, Corene Cornea, MD 01/25/21 [phone]

## 2021-01-26 ENCOUNTER — Emergency Department (HOSPITAL_BASED_OUTPATIENT_CLINIC_OR_DEPARTMENT_OTHER): Payer: Self-pay | Admitting: Radiology

## 2021-01-26 ENCOUNTER — Other Ambulatory Visit: Payer: Self-pay

## 2021-01-26 ENCOUNTER — Encounter (HOSPITAL_BASED_OUTPATIENT_CLINIC_OR_DEPARTMENT_OTHER): Payer: Self-pay | Admitting: Obstetrics and Gynecology

## 2021-01-26 ENCOUNTER — Emergency Department (HOSPITAL_BASED_OUTPATIENT_CLINIC_OR_DEPARTMENT_OTHER)
Admission: EM | Admit: 2021-01-26 | Discharge: 2021-01-26 | Disposition: A | Discharge status: Home / Self Care | Payer: Self-pay | Attending: Emergency Medicine | Admitting: Emergency Medicine

## 2021-01-26 DIAGNOSIS — Z20822 Contact with and (suspected) exposure to covid-19: Secondary | ICD-10-CM | POA: Insufficient documentation

## 2021-01-26 DIAGNOSIS — R0602 Shortness of breath: Secondary | ICD-10-CM | POA: Insufficient documentation

## 2021-01-26 DIAGNOSIS — R1013 Epigastric pain: Secondary | ICD-10-CM | POA: Insufficient documentation

## 2021-01-26 DIAGNOSIS — R072 Precordial pain: Secondary | ICD-10-CM | POA: Insufficient documentation

## 2021-01-26 DIAGNOSIS — R519 Headache, unspecified: Secondary | ICD-10-CM | POA: Insufficient documentation

## 2021-01-26 DIAGNOSIS — R1010 Upper abdominal pain, unspecified: Secondary | ICD-10-CM | POA: Insufficient documentation

## 2021-01-26 DIAGNOSIS — Z91040 Latex allergy status: Secondary | ICD-10-CM | POA: Insufficient documentation

## 2021-01-26 DIAGNOSIS — R079 Chest pain, unspecified: Secondary | ICD-10-CM

## 2021-01-26 LAB — CBC
HCT: 42.9 % (ref 36.0–46.0)
Hemoglobin: 13.6 g/dL (ref 12.0–15.0)
MCH: 29.6 pg (ref 26.0–34.0)
MCHC: 31.7 g/dL (ref 30.0–36.0)
MCV: 93.3 fL (ref 80.0–100.0)
Platelets: 209 10*3/uL (ref 150–400)
RBC: 4.6 MIL/uL (ref 3.87–5.11)
RDW: 13.9 % (ref 11.5–15.5)
WBC: 10.8 10*3/uL — ABNORMAL HIGH (ref 4.0–10.5)
nRBC: 0 % (ref 0.0–0.2)

## 2021-01-26 LAB — COMPREHENSIVE METABOLIC PANEL
ALT: 21 U/L (ref 0–44)
AST: 17 U/L (ref 15–41)
Albumin: 4.1 g/dL (ref 3.5–5.0)
Alkaline Phosphatase: 68 U/L (ref 38–126)
Anion gap: 9 (ref 5–15)
BUN: 16 mg/dL (ref 6–20)
CO2: 27 mmol/L (ref 22–32)
Calcium: 9 mg/dL (ref 8.9–10.3)
Chloride: 104 mmol/L (ref 98–111)
Creatinine, Ser: 0.57 mg/dL (ref 0.44–1.00)
GFR, Estimated: >60 mL/min (ref >60)
Glucose, Bld: 93 mg/dL (ref 70–99)
Potassium: 4 mmol/L (ref 3.5–5.1)
Sodium: 140 mmol/L (ref 135–145)
Total Bilirubin: 0.3 mg/dL (ref 0.3–1.2)
Total Protein: 7 g/dL (ref 6.5–8.1)

## 2021-01-26 LAB — RESP PANEL BY RT-PCR (FLU A&B, COVID) ARPGX2
Influenza A by PCR: NEGATIVE
Influenza B by PCR: NEGATIVE
SARS Coronavirus 2 by RT PCR: NEGATIVE

## 2021-01-26 LAB — TROPONIN I (HIGH SENSITIVITY)
Troponin I (High Sensitivity): <2 ng/L (ref <18)
Troponin I (High Sensitivity): <2 ng/L (ref <18)

## 2021-01-26 LAB — PREGNANCY, URINE: Preg Test, Ur: NEGATIVE

## 2021-01-26 MED ORDER — ALUM & MAG HYDROXIDE-SIMETH 200-200-20 MG/5ML PO SUSP
30.0000 mL | Freq: Once | ORAL | Status: AC
  Administered 2021-01-26: 30 mL via ORAL
  Filled 2021-01-26: qty 30

## 2021-01-26 MED ORDER — LIDOCAINE VISCOUS HCL 2 % MT SOLN
15.0000 mL | Freq: Once | OROMUCOSAL | Status: AC
  Administered 2021-01-26: 15 mL via ORAL
  Filled 2021-01-26: qty 15

## 2021-01-26 MED ORDER — PANTOPRAZOLE SODIUM 20 MG PO TBEC: 20.0000 mg | DELAYED_RELEASE_TABLET | Freq: Every day | ORAL | 0 refills | Status: DC

## 2021-01-26 MED ORDER — SODIUM CHLORIDE 0.9 % IV BOLUS
1000.0000 mL | Freq: Once | INTRAVENOUS | Status: AC
  Administered 2021-01-26: 1000 mL via INTRAVENOUS

## 2021-01-26 NOTE — ED Provider Notes (Signed)
[name] DEPT Provider Note   CSN: [id number] Arrival date & time: 01/26/21  1812     History Chief Complaint  Patient presents with   Chest Pain    Samantha Banks is a 60 y.o. female.  Seen and evaluated 2 days ago.  At that time she had dizziness, body aches, and a headache.  ED evaluation was within normal limits, and she also had a CT scan of her abdomen and pelvis.  She states that she has had some central chest pain which started yesterday.  The history is provided by the patient.  Chest Pain Pain location:  Epigastric and substernal area Pain quality comment:  Like something is sitting on it Pain radiates to:  Does not radiate Pain severity:  Mild Onset quality:  Gradual Duration:  2 days Timing:  Constant Progression:  Unchanged Chronicity:  New Context: at rest   Relieved by:  Leaning forward Exacerbated by: supine position. Ineffective treatments:  None tried Associated symptoms: abdominal pain (upper), headache (has chronic migraine) and shortness of breath ("feels like my lungs aren't working")   Associated symptoms: no back pain, no cough, no fever, no lower extremity edema, no nausea, no palpitations and no vomiting       Past Medical History:  Diagnosis Date   Allergic rhinitis    Arthritis    Headache    chronic migraines    No pertinent past medical history     Patient Active Problem List   Diagnosis Date Noted   Atypical chest pain 01/23/2019   Arthritis 01/22/2017   Swelling of limb 01/22/2017   Pain in limb 01/22/2017   Chronic tension-type headache, not intractable 01/09/2016   Anxiety state 01/05/2016   Lumbar radiculopathy 01/05/2016   Migraine without aura and without status migrainosus, not intractable 01/05/2016   Paresthesia 01/05/2016   Vocal cord dysfunction 01/05/2016   Chronic migraine 04/13/2015   Seasonal allergic rhinitis due to pollen 03/30/2015   Dyspnea 09/20/2014   Chest wall pain  09/20/2014    Past Surgical History:  Procedure Laterality Date   CESAREAN SECTION     COLONOSCOPY WITH PROPOFOL N/A 12/17/2016   Procedure: COLONOSCOPY WITH PROPOFOL;  Surgeon: Lollie Sails, MD;  Location: [facility] ENDOSCOPY;  Service: Endoscopy;  Laterality: N/A;     OB History     Gravida  3   Para  3   Term  3   Preterm  0   AB  0   Living  3      SAB  0   IAB  0   Ectopic  0   Multiple  0   Live Births              Family History  Problem Relation Age of Onset   Throat cancer Mother    Osteoarthritis Mother    Hypertension Father    Diabetes Mellitus II Sister    Asthma Sister    Other Neg Hx    Stomach cancer Neg Hx    Pancreatic cancer Neg Hx    Colon cancer Neg Hx     Social History   Tobacco Use   Smoking status: Never   Smokeless tobacco: Never  Vaping Use   Vaping Use: Never used  Substance Use Topics   Alcohol use: No    Alcohol/week: 0.0 standard drinks   Drug use: No    Home Medications Prior to Admission medications   Medication Sig Start Date End Date  Taking? Authorizing Provider  acetaminophen (TYLENOL) 500 MG tablet Take 1,000 mg by mouth every 6 (six) hours as needed for moderate pain or headache.    [provider]  cholecalciferol (VITAMIN D3) 25 MCG (1000 UNIT) tablet Take 1,000 Units by mouth daily.    [provider]  HYDROcodone-acetaminophen (NORCO) 5-325 MG tablet Take 1 tablet by mouth every 4 (four) hours as needed for moderate pain. [id number]   Delora Fuel, MD  ibuprofen (ADVIL) 600 MG tablet Take 1 tablet (600 mg total) by mouth 3 (three) times daily. 01/24/21   Kommor, Madison, MD  lidocaine (LIDODERM) 5 % Place 1 patch onto the skin daily. Remove & Discard patch within 12 hours or as directed by MD 08/16/19   Henderly, Britni A, [name]  Multiple Vitamin (MULTI-VITAMIN) tablet Take by mouth.    [provider]  Multiple Vitamin (MULTIVITAMIN ADULT PO) Take by mouth.    [provider]  sertraline (ZOLOFT) 25 MG tablet Take by mouth. 10/06/19 01/04/20  [provider]    Allergies    Latex and Contrast media [iodinated diagnostic agents]  Review of Systems   Review of Systems  Constitutional:  Negative for chills and fever.  HENT:  Negative for ear pain and sore throat.   Eyes:  Negative for pain and visual disturbance.  Respiratory:  Positive for shortness of breath ("feels like my lungs aren't working"). Negative for cough.   Cardiovascular:  Positive for chest pain. Negative for palpitations.  Gastrointestinal:  Positive for abdominal pain (upper). Negative for nausea and vomiting.  Genitourinary:  Negative for dysuria and hematuria.  Musculoskeletal:  Negative for arthralgias and back pain.  Skin:  Negative for color change and rash.  Neurological:  Positive for headaches (has chronic migraine). Negative for seizures and syncope.  All other systems reviewed and are negative.  Physical Exam Updated Vital Signs BP 96/83   Pulse 67   Temp 98 F (36.7 C)   Resp 17   LMP 02/01/2012   SpO2 99%   Physical Exam Vitals and nursing note reviewed.  Constitutional:      Appearance: She is well-developed.  HENT:     Head: Normocephalic and atraumatic.  Cardiovascular:     Rate and Rhythm: Normal rate and regular rhythm.     Heart sounds: Normal heart sounds.  Pulmonary:     Effort: Pulmonary effort is normal. No tachypnea.     Breath sounds: Normal breath sounds.  Abdominal:     Palpations: Abdomen is soft.     Tenderness: There is no abdominal tenderness.  Musculoskeletal:     Right lower leg: No edema.     Left lower leg: No edema.  Skin:    General: Skin is warm and dry.  Neurological:     General: No focal deficit present.     Mental Status: She is alert and oriented to person, place, and time.  Psychiatric:        Mood and Affect: Mood normal.        Behavior: Behavior normal.    ED Results / Procedures / Treatments    Labs (all labs ordered are listed, but only abnormal results are displayed) Labs Reviewed  CBC - Abnormal; Notable for the following components:      Result Value   WBC 10.8 (*)    All other components within normal limits  RESP PANEL BY RT-PCR (FLU A&B, COVID) ARPGX2  PREGNANCY, URINE  COMPREHENSIVE METABOLIC PANEL  TROPONIN  I (HIGH SENSITIVITY)  TROPONIN I (HIGH SENSITIVITY)    EKG EKG Interpretation  Date/Time:  Thursday January 26 2021 18:22:27 EDT Ventricular Rate:  72 PR Interval:  164 QRS Duration: 72 QT Interval:  364 QTC Calculation: 398 R Axis:   46 Text Interpretation: Normal sinus rhythm Normal ECG normal axis No acute ischemia Confirmed by Lorre Munroe ([id number]) on 01/26/2021 6:38:52 PM  Radiology DG Chest 2 View  Result Date: 01/26/2021 CLINICAL DATA:  Chest pain EXAM: CHEST - 2 VIEW COMPARISON:  05/21/2020 FINDINGS: The heart size and mediastinal contours are within normal limits. Both lungs are clear. The visualized skeletal structures are unremarkable. IMPRESSION: No active cardiopulmonary disease. Electronically Signed   By: Inez Catalina M.D.   On: 01/26/2021 18:55    Procedures Procedures   Medications Ordered in ED Medications  sodium chloride 0.9 % bolus 1,000 mL (has no administration in time range)  alum & mag hydroxide-simeth (MAALOX/MYLANTA) 200-200-20 MG/5ML suspension 30 mL (has no administration in time range)    And  lidocaine (XYLOCAINE) 2 % viscous mouth solution 15 mL (has no administration in time range)    ED Course  I have reviewed the triage vital signs and the nursing notes.  Pertinent labs & imaging results that were available during my care of the patient were reviewed by me and considered in my medical decision making (see chart for details).    MDM Rules/Calculators/A&P                           Samantha Banks presented complaining of epigastric and chest pain.  This is her second ED visit in 2 days.  She was evaluated for  evidence of COVID-19, pneumonia, ACS.  ED work-up was completely normal, and I think a GI source is most likely.  She was advised to take protonix and follow-up with GI. Final Clinical Impression(s) / ED Diagnoses Final diagnoses:  Chest pain, unspecified type    Rx / DC Orders ED Discharge Orders          Ordered    pantoprazole (PROTONIX) 20 MG tablet  Daily        01/26/21 2127             Arnaldo Natal, MD 01/26/21 2129

## 2021-01-26 NOTE — ED Triage Notes (Signed)
Patient reports to the ER for chest pain. Patient reports she was seen and told she did not have COVID. Patient reports she feels like her lungs are not working.

## 2021-02-27 ENCOUNTER — Ambulatory Visit: Payer: Self-pay | Admitting: Family Medicine

## 2021-08-04 ENCOUNTER — Emergency Department: Payer: Self-pay

## 2021-08-04 ENCOUNTER — Encounter: Payer: Self-pay | Admitting: Intensive Care

## 2021-08-04 ENCOUNTER — Other Ambulatory Visit: Payer: Self-pay

## 2021-08-04 ENCOUNTER — Emergency Department
Admission: EM | Admit: 2021-08-04 | Discharge: 2021-08-04 | Disposition: A | Discharge status: Home / Self Care | Payer: Self-pay | Attending: Emergency Medicine | Admitting: Emergency Medicine

## 2021-08-04 DIAGNOSIS — M79606 Pain in leg, unspecified: Secondary | ICD-10-CM

## 2021-08-04 DIAGNOSIS — R0789 Other chest pain: Secondary | ICD-10-CM | POA: Insufficient documentation

## 2021-08-04 DIAGNOSIS — M79662 Pain in left lower leg: Secondary | ICD-10-CM | POA: Insufficient documentation

## 2021-08-04 DIAGNOSIS — R0602 Shortness of breath: Secondary | ICD-10-CM | POA: Insufficient documentation

## 2021-08-04 DIAGNOSIS — R079 Chest pain, unspecified: Secondary | ICD-10-CM

## 2021-08-04 LAB — CBC
HCT: 43.1 % (ref 36.0–46.0)
Hemoglobin: 13.6 g/dL (ref 12.0–15.0)
MCH: 29.4 pg (ref 26.0–34.0)
MCHC: 31.6 g/dL (ref 30.0–36.0)
MCV: 93.3 fL (ref 80.0–100.0)
Platelets: 273 10*3/uL (ref 150–400)
RBC: 4.62 MIL/uL (ref 3.87–5.11)
RDW: 13.3 % (ref 11.5–15.5)
WBC: 9.9 10*3/uL (ref 4.0–10.5)
nRBC: 0 % (ref 0.0–0.2)

## 2021-08-04 LAB — BASIC METABOLIC PANEL
Anion gap: 7 (ref 5–15)
BUN: 14 mg/dL (ref 6–20)
CO2: 26 mmol/L (ref 22–32)
Calcium: 8.5 mg/dL — ABNORMAL LOW (ref 8.9–10.3)
Chloride: 106 mmol/L (ref 98–111)
Creatinine, Ser: 0.51 mg/dL (ref 0.44–1.00)
GFR, Estimated: >60 mL/min (ref >60)
Glucose, Bld: 119 mg/dL — ABNORMAL HIGH (ref 70–99)
Potassium: 3.5 mmol/L (ref 3.5–5.1)
Sodium: 139 mmol/L (ref 135–145)

## 2021-08-04 LAB — TROPONIN I (HIGH SENSITIVITY): Troponin I (High Sensitivity): 3 ng/L (ref <18)

## 2021-08-04 NOTE — ED Provider Notes (Signed)
[facility] Provider Note  Patient Contact: 3:35 PM (approximate)   History   Chest Pain   HPI  Samantha Banks is a 61 y.o. female who presents the emergency department complaining of chest tightness, shortness of breath, leg pain intermittently for roughly a month.  Patient states that she has not had fevers, chills, nasal congestion, sore throat.  There is no cough.  No GI symptoms.  Patient states that she has had similar symptoms in the past, on review of the patient's medical record it appears that she was seen in August at [facility] for several complaint.  She had a negative cardiac work-up at that time and was diagnosed with likely GI source.  Patient denies taking any medications for same.  Patient states that her leg pain is in the left calf and reports edema and skin color changes as well.     Physical Exam   Triage Vital Signs: ED Triage Vitals  Enc Vitals Group     BP 08/04/21 1508 (!) 131/95     Pulse Rate 08/04/21 1508 72     Resp 08/04/21 1508 18     Temp 08/04/21 1508 98.5 F (36.9 C)     Temp Source 08/04/21 1508 Oral     SpO2 08/04/21 1508 100 %     Weight 08/04/21 1503 170 lb (77.1 kg)     Height 08/04/21 1503 4\' 11"  (1.499 m)     Head Circumference --      Peak Flow --      Pain Score 08/04/21 1503 7     Pain Loc --      Pain Edu? --      Excl. in [place]? --     Most recent vital signs: Vitals:   08/04/21 1508  BP: (!) 131/95  Pulse: 72  Resp: 18  Temp: 98.5 F (36.9 C)  SpO2: 100%     General: Alert and in no acute distress. ENT:      Ears:       Nose: No congestion/rhinnorhea.      Mouth/Throat: Mucous membranes are moist.  Cardiovascular:  Good peripheral perfusion.  No murmurs, rubs, gallops Respiratory: Normal respiratory effort without tachypnea or retractions. Lungs CTAB. Good air entry to the bases with no decreased or absent breath sounds. Gastrointestinal: Bowel sounds 4 quadrants. Soft and  nontender to palpation. No guarding or rigidity. No palpable masses. No distention. No CVA tenderness. Musculoskeletal: Full range of motion to all extremities.  Visualization of the left calf reveals no gross edema, erythema.  Full range of motion to the knee and ankle joint.  Dorsalis pedis pulses sensation intact distally. Neurologic:  No gross focal neurologic deficits are appreciated.  Skin:   No rash noted Other:   ED Results / Procedures / Treatments   Labs (all labs ordered are listed, but only abnormal results are displayed) Labs Reviewed  BASIC METABOLIC PANEL - Abnormal; Notable for the following components:      Result Value   Glucose, Bld 119 (*)    Calcium 8.5 (*)    All other components within normal limits  CBC  TROPONIN I (HIGH SENSITIVITY)     EKG  ED ECG REPORT I, Charline Bills Prescious Hurless,  personally viewed and interpreted this ECG.   Date: 07/31/2021  EKG Time: 1505 hrs.  Rate: 71 bpm  Rhythm: unchanged from previous tracings, normal sinus rhythm, no significant changes from previous EKG on 01/28/2021  Axis:  Normal axis  Intervals:none  ST&T Change: No ST elevation or depression noted  Normal sinus rhythm.  No STEMI.  No significant changes from previous EKG    RADIOLOGY  I personally viewed and evaluated these images as part of my medical decision making, as well as reviewing the written report by the radiologist.  ED Provider Interpretation: No evidence of cardiopulmonary abnormality on chest x-ray.  No evidence of DVT on ultrasound.  DG Chest 2 View  Result Date: 08/04/2021 CLINICAL DATA:  Chest achiness for 1 month, headache, intermittent shortness of breath EXAM: CHEST - 2 VIEW COMPARISON:  01/26/2021 FINDINGS: Normal heart size, mediastinal contours, and pulmonary vascularity. Lungs clear. No pleural effusion or pneumothorax. Bones unremarkable. IMPRESSION: Normal exam. Electronically Signed   By: Lavonia Dana M.D.   On: 08/04/2021 15:36   US  Venous Img Lower Unilateral Left (DVT)  Result Date: 08/04/2021 CLINICAL DATA:  LEFT leg pain. EXAM: LEFT LOWER EXTREMITY VENOUS DOPPLER ULTRASOUND TECHNIQUE: Gray-scale sonography with compression, as well as color and duplex ultrasound, were performed to evaluate the deep venous system(s) from the level of the common femoral vein through the popliteal and proximal calf veins. COMPARISON:  None. FINDINGS: VENOUS Normal compressibility of the LEFT common femoral, superficial femoral, and popliteal veins, as well as the visualized calf veins. Visualized portions of profunda femoral vein and great saphenous vein unremarkable. No filling defects to suggest DVT on grayscale or color Doppler imaging. Doppler waveforms show normal direction of venous flow, normal respiratory plasticity and response to augmentation. Limited views of the contralateral common femoral vein are unremarkable. OTHER No evidence of superficial thrombophlebitis or abnormal fluid collection. Limitations: none IMPRESSION: No evidence of femoropopliteal DVT within the LEFT lower extremity. Michaelle Birks, MD [facility] Radiology Electronically Signed   By: Michaelle Birks M.D.   On: 08/04/2021 16:35    PROCEDURES:  Critical Care performed: No  Procedures   MEDICATIONS ORDERED IN ED: Medications - No data to display   IMPRESSION / MDM / [place] / ED COURSE  I reviewed the triage vital signs and the nursing notes.                              Differential diagnosis includes, but is not limited to, STEMI, evidence of NSTEMI, nonspecific chest pain, bronchitis, pneumonia   Patient's diagnosis is consistent with nonspecific chest pain.  Patient presented to the emergency department with a month worth of chest pain and reported shortness of breath.  From the review of patient's medical records the last time she was seen in the [facility] system was also for the same complaint.  This  occurred in August.  She had a reassuring work-up at that time.  She has no cardiac history.  Exam was reassuring.  CBC, CMP, troponin, EKG are reassuring.  Patient was also complaining of some calf pain and ultrasound was performed no evidence of DVT.  Chest x-ray revealed no acute cardiopulmonary findings.  At this time with reassuring work-up I feel that the patient is stable for discharge.  Given the arrival complaint of chest pain admission was considered but again with reassuring troponin, EKG, imaging I feel that she is stable for outpatient follow-up with cardiology.  It appears that patient has seen cardiology roughly a year and a half ago but did not complete the recommended work-up to include echo and stress test.  I recommended following  up with cardiology given the fact that she had repetitive episodes of chest pain even though these encounters are typically reassuring work-ups.  Patient will follow-up with cardiology at this time.  Return precautions discussed with the patient..  Patient is given ED precautions to return to the ED for any worsening or new symptoms.    Clinical Course as of 08/04/21 1740  Fri Aug 04, 2021  1615 BP(!): 131/95 [KL]    Clinical Course User Index [KL] Aurea Graff, Student-PA     FINAL CLINICAL IMPRESSION(S) / ED DIAGNOSES   Final diagnoses:  Leg pain  Nonspecific chest pain  Pain of left calf     Rx / DC Orders   ED Discharge Orders     None        Note:  This document was prepared using Dragon voice recognition software and may include unintentional dictation errors.   Brynda Peon 08/04/21 1740    Nena Polio, MD 08/04/21 2224

## 2021-08-04 NOTE — ED Notes (Signed)
Provider has seen pt, second trop was canceled, pt is about to be discharged.

## 2021-08-04 NOTE — ED Triage Notes (Signed)
Patient c/o achy chest X1 month. Reports headache and intermittent SOB. Denies N/V

## 2021-11-30 ENCOUNTER — Ambulatory Visit
Admission: EM | Admit: 2021-11-30 | Discharge: 2021-11-30 | Disposition: A | Discharge status: Home / Self Care | Payer: Self-pay | Attending: Emergency Medicine | Admitting: Emergency Medicine

## 2021-11-30 ENCOUNTER — Ambulatory Visit (INDEPENDENT_AMBULATORY_CARE_PROVIDER_SITE_OTHER): Payer: Self-pay

## 2021-11-30 DIAGNOSIS — R030 Elevated blood-pressure reading, without diagnosis of hypertension: Secondary | ICD-10-CM

## 2021-11-30 DIAGNOSIS — R519 Headache, unspecified: Secondary | ICD-10-CM

## 2021-11-30 DIAGNOSIS — F418 Other specified anxiety disorders: Secondary | ICD-10-CM

## 2021-11-30 DIAGNOSIS — R739 Hyperglycemia, unspecified: Secondary | ICD-10-CM

## 2021-11-30 DIAGNOSIS — R062 Wheezing: Secondary | ICD-10-CM

## 2021-11-30 LAB — POCT URINALYSIS DIP (MANUAL ENTRY)
Bilirubin, UA: NEGATIVE
Blood, UA: NEGATIVE
Glucose, UA: NEGATIVE mg/dL
Ketones, POC UA: NEGATIVE mg/dL
Leukocytes, UA: NEGATIVE
Nitrite, UA: NEGATIVE
Protein Ur, POC: NEGATIVE mg/dL
Spec Grav, UA: >=1.03 — AB (ref 1.010–1.025)
Urobilinogen, UA: 0.2 E.U./dL
pH, UA: 5.5 (ref 5.0–8.0)

## 2021-11-30 NOTE — ED Triage Notes (Signed)
Pt c/o wheezing (worse at night- started a month ago), head pressure (started last year). The patient states she has a rash to both arms.  Home interventions: tylenol

## 2021-11-30 NOTE — ED Provider Notes (Signed)
[facility]    CSN: [id number] Arrival date & time: 11/30/21  1722    HISTORY   Chief Complaint  Patient presents with   Wheezing   Rash   HPI Samantha Banks is a 61 y.o. female. Patient presents to urgent care complaining of wheezing that began about a month ago.  Patient states the wheezing is worse at night.  Patient is requesting a chest x-ray to make sure she does not have pneumonia.  Patient states that last year she also began to experience head pressure.  Patient has elevated blood pressure on arrival today, not currently taking any blood pressure medications.  Patient states she would like a referral to a neurologist to have her head scanned because she has been having headaches.  Patient is complaining of a rash on both of her arms that appeared today.  Patient states has been taking Tylenol for all of these symptoms which has not provided meaningful relief.  Patient states her previous provider left the practice and she is still looking for a new provider.  Patient reports a history of prediabetes, states she has had increased frequency of urination recently and would like [place] to check her urine for sugar.  Patient is also requesting "blood work", when asked why she would like to have blood work done, patient states she does not have a specific reason.    Past Medical History:  Diagnosis Date   Allergic rhinitis    Arthritis    Headache    chronic migraines    No pertinent past medical history    Patient Active Problem List   Diagnosis Date Noted   Atypical chest pain 01/23/2019   Arthritis 01/22/2017   Swelling of limb 01/22/2017   Pain in limb 01/22/2017   Chronic tension-type headache, not intractable 01/09/2016   Anxiety state 01/05/2016   Lumbar radiculopathy 01/05/2016   Migraine without aura and without status migrainosus, not intractable 01/05/2016   Paresthesia 01/05/2016   Vocal cord dysfunction 01/05/2016   Chronic migraine 04/13/2015    Seasonal allergic rhinitis due to pollen 03/30/2015   Dyspnea 09/20/2014   Chest wall pain 09/20/2014   Past Surgical History:  Procedure Laterality Date   CESAREAN SECTION     COLONOSCOPY WITH PROPOFOL N/A 12/17/2016   Procedure: COLONOSCOPY WITH PROPOFOL;  Surgeon: Lollie Sails, MD;  Location: [facility] ENDOSCOPY;  Service: Endoscopy;  Laterality: N/A;   OB History     Gravida  3   Para  3   Term  3   Preterm  0   AB  0   Living  3      SAB  0   IAB  0   Ectopic  0   Multiple  0   Live Births             Home Medications    Prior to Admission medications   Medication Sig Start Date End Date Taking? Authorizing Provider  pantoprazole (PROTONIX) 20 MG tablet Take 1 tablet (20 mg total) by mouth daily. 01/26/21 02/25/21  Arnaldo Natal, MD  sertraline (ZOLOFT) 25 MG tablet Take by mouth. 10/06/19 01/04/20  [provider]   Family History Family History  Problem Relation Age of Onset   Throat cancer Mother    Osteoarthritis Mother    Hypertension Father    Diabetes Mellitus II Sister    Asthma Sister    Other Neg Hx    Stomach cancer Neg Hx  Pancreatic cancer Neg Hx    Colon cancer Neg Hx    Social History Social History   Tobacco Use   Smoking status: Never   Smokeless tobacco: Never  Vaping Use   Vaping Use: Never used  Substance Use Topics   Alcohol use: No    Alcohol/week: 0.0 standard drinks of alcohol   Drug use: No   Allergies   Latex and Contrast media [iodinated contrast media]  Review of Systems Review of Systems Pertinent findings noted in history of present illness.   Physical Exam Triage Vital Signs ED Triage Vitals  Enc Vitals Group     BP 04/07/21 0827 (!) 147/82     Pulse Rate 04/07/21 0827 72     Resp 04/07/21 0827 18     Temp 04/07/21 0827 98.3 F (36.8 C)     Temp Source 04/07/21 0827 Oral     SpO2 04/07/21 0827 98 %     Weight --      Height --      Head Circumference --      Peak Flow --      Pain  Score 04/07/21 0826 5     Pain Loc --      Pain Edu? --      Excl. in [place]? --   No data found.  Updated Vital Signs BP (!) 141/78 (BP Location: Right Arm)   Pulse 68   Temp 98 F (36.7 C) (Oral)   Resp 20   LMP 02/01/2012   SpO2 97%   Physical Exam Vitals and nursing note reviewed.  Constitutional:      General: She is not in acute distress.    Appearance: Normal appearance. She is not ill-appearing.  HENT:     Head: Normocephalic and atraumatic.     Salivary Glands: Right salivary gland is not diffusely enlarged or tender. Left salivary gland is not diffusely enlarged or tender.     Right Ear: Tympanic membrane, ear canal and external ear normal. No drainage. No middle ear effusion. There is no impacted cerumen. Tympanic membrane is not erythematous or bulging.     Left Ear: Tympanic membrane, ear canal and external ear normal. No drainage.  No middle ear effusion. There is no impacted cerumen. Tympanic membrane is not erythematous or bulging.     Nose: Nose normal. No nasal deformity, septal deviation, mucosal edema, congestion or rhinorrhea.     Right Turbinates: Not enlarged, swollen or pale.     Left Turbinates: Not enlarged, swollen or pale.     Right Sinus: No maxillary sinus tenderness or frontal sinus tenderness.     Left Sinus: No maxillary sinus tenderness or frontal sinus tenderness.     Mouth/Throat:     Lips: Pink. No lesions.     Mouth: Mucous membranes are moist. No oral lesions.     Pharynx: Oropharynx is clear. Uvula midline. No posterior oropharyngeal erythema or uvula swelling.     Tonsils: No tonsillar exudate. 0 on the right. 0 on the left.  Eyes:     General: Lids are normal.        Right eye: No discharge.        Left eye: No discharge.     Extraocular Movements: Extraocular movements intact.     Conjunctiva/sclera: Conjunctivae normal.     Right eye: Right conjunctiva is not injected.     Left eye: Left conjunctiva is not injected.  Neck:      Trachea: Trachea  and phonation normal.  Cardiovascular:     Rate and Rhythm: Normal rate and regular rhythm.     Pulses: Normal pulses.     Heart sounds: Normal heart sounds. No murmur heard.    No friction rub. No gallop.  Pulmonary:     Effort: Pulmonary effort is normal. No accessory muscle usage, prolonged expiration or respiratory distress.     Breath sounds: Normal breath sounds. No stridor, decreased air movement or transmitted upper airway sounds. No decreased breath sounds, wheezing, rhonchi or rales.  Chest:     Chest wall: No tenderness.  Abdominal:     General: Abdomen is flat. Bowel sounds are normal.     Palpations: Abdomen is soft.     Tenderness: There is no abdominal tenderness.  Musculoskeletal:        General: Normal range of motion.     Cervical back: Normal range of motion and neck supple. Normal range of motion.  Lymphadenopathy:     Cervical: No cervical adenopathy.  Skin:    General: Skin is warm and dry.     Findings: No erythema or rash.  Neurological:     General: No focal deficit present.     Mental Status: She is alert and oriented to person, place, and time.  Psychiatric:        Mood and Affect: Mood normal.        Behavior: Behavior normal.     Visual Acuity Right Eye Distance:   Left Eye Distance:   Bilateral Distance:    Right Eye Near:   Left Eye Near:    Bilateral Near:     UC Couse / Diagnostics / Procedures:    EKG  Radiology DG Chest 2 View  Result Date: 11/30/2021 CLINICAL DATA:  Provided history: Patient reports wheezing. EXAM: CHEST - 2 VIEW COMPARISON:  Prior chest radiographs 08/04/2021 and earlier. FINDINGS: Heart size within normal limits. No appreciable airspace consolidation. No evidence of pleural effusion or pneumothorax. No acute bony abnormality identified. Mild dextrocurvature of the upper to midthoracic spine. IMPRESSION: No evidence of active cardiopulmonary disease. Mild dextrocurvature of the upper to midthoracic  spine. Electronically Signed   By: Kellie Simmering D.O.   On: 11/30/2021 18:25    Procedures Procedures (including critical care time)  UC Diagnoses / Final Clinical Impressions(s)   I have reviewed the triage vital signs and the nursing notes.  Pertinent labs & imaging results that were available during my care of the patient were reviewed by me and considered in my medical decision making (see chart for details).   Final diagnoses:  Elevated blood pressure reading in office without diagnosis of hypertension  Frequent headaches  Hyperglycemia  Anxiety about health   Physical exam today is unremarkable.  Chest x-ray today is unremarkable.  Urine dip today is unremarkable.  Patient given information regarding finding a [place] primary care provider.  Advised her to follow-up regarding all of the symptoms.  ED Prescriptions   None    PDMP not reviewed this encounter.  Pending results:  Labs Reviewed  POCT URINALYSIS DIP (MANUAL ENTRY) - Abnormal; Notable for the following components:      Result Value   Spec Grav, UA >=1.030 (*)    All other components within normal limits    Medications Ordered in UC: Medications - No data to display  Disposition Upon Discharge:  Condition: stable for discharge home Home: take medications as prescribed; routine discharge instructions as discussed; follow up as  advised.  Patient presented with an acute illness with associated systemic symptoms and significant discomfort requiring urgent management. In my opinion, this is a condition that a prudent lay person (someone who possesses an average knowledge of health and medicine) may potentially expect to result in complications if not addressed urgently such as respiratory distress, impairment of bodily function or dysfunction of bodily organs.   Routine symptom specific, illness specific and/or disease specific instructions were discussed with the patient and/or caregiver at length.   As such,  the patient has been evaluated and assessed, work-up was performed and treatment was provided in alignment with urgent care protocols and evidence based medicine.  Patient/parent/caregiver has been advised that the patient may require follow up for further testing and treatment if the symptoms continue in spite of treatment, as clinically indicated and appropriate.  If the patient was tested for COVID-19, Influenza and/or RSV, then the patient/parent/guardian was advised to isolate at home pending the results of his/her diagnostic coronavirus test and potentially longer if they're positive. I have also advised pt that if his/her COVID-19 test returns positive, it's recommended to self-isolate for at least 10 days after symptoms first appeared AND until fever-free for 24 hours without fever reducer AND other symptoms have improved or resolved. Discussed self-isolation recommendations as well as instructions for household member/close contacts as per the [facility] and [place] DHHS, and also gave patient the [place] packet with this information.  Patient/parent/caregiver has been advised to return to the [facility] or PCP in 3-5 days if no better; to PCP or the Emergency Department if new signs and symptoms develop, or if the current signs or symptoms continue to change or worsen for further workup, evaluation and treatment as clinically indicated and appropriate  The patient will follow up with their current PCP if and as advised. If the patient does not currently have a PCP we will assist them in obtaining one.   The patient may need specialty follow up if the symptoms continue, in spite of conservative treatment and management, for further workup, evaluation, consultation and treatment as clinically indicated and appropriate.  Patient/parent/caregiver verbalized understanding and agreement of plan as discussed.  All questions were addressed during visit.  Please see discharge instructions below for further details of  plan.  Discharge Instructions:   Discharge Instructions      Your chest x-ray today is normal.  Your urinalysis today is normal.  We do not perform routine blood work here in urgent care.  Please follow-up with your new primary care provider regarding your blood pressure, your blood sugar, your headaches.  Thank you for visiting urgent care today.      This office note has been dictated using Museum/gallery curator.  Unfortunately, and despite my best efforts, this method of dictation can sometimes lead to occasional typographical or grammatical errors.  I apologize in advance if this occurs.     Lynden Oxford Scales, [name] 11/30/21 1836

## 2021-11-30 NOTE — Discharge Instructions (Addendum)
Your chest x-ray today is normal.  Your urinalysis today is normal.  We do not perform routine blood work here in urgent care.  Please follow-up with your new primary care provider regarding your blood pressure, your blood sugar, your headaches.  Thank you for visiting urgent care today.

## 2022-01-12 ENCOUNTER — Ambulatory Visit (HOSPITAL_COMMUNITY)
Admission: EM | Admit: 2022-01-12 | Discharge: 2022-01-12 | Disposition: A | Discharge status: Home / Self Care | Payer: PRIVATE HEALTH INSURANCE | Attending: Family Medicine | Admitting: Family Medicine

## 2022-01-12 ENCOUNTER — Encounter (HOSPITAL_COMMUNITY): Payer: Self-pay | Admitting: *Deleted

## 2022-01-12 ENCOUNTER — Other Ambulatory Visit: Payer: Self-pay

## 2022-01-12 DIAGNOSIS — M79605 Pain in left leg: Secondary | ICD-10-CM

## 2022-01-12 DIAGNOSIS — M79604 Pain in right leg: Secondary | ICD-10-CM

## 2022-01-12 MED ORDER — KETOROLAC TROMETHAMINE 30 MG/ML IJ SOLN
30.0000 mg | Freq: Once | INTRAMUSCULAR | Status: AC
  Administered 2022-01-12: 30 mg via INTRAMUSCULAR

## 2022-01-12 MED ORDER — KETOROLAC TROMETHAMINE 30 MG/ML IJ SOLN
INTRAMUSCULAR | Status: AC
  Filled 2022-01-12: qty 1

## 2022-01-12 MED ORDER — IBUPROFEN 800 MG PO TABS: 800.0000 mg | ORAL_TABLET | Freq: Three times a day (TID) | ORAL | 0 refills | Status: DC | PRN

## 2022-01-12 MED ORDER — TIZANIDINE HCL 4 MG PO TABS: 4.0000 mg | ORAL_TABLET | Freq: Three times a day (TID) | ORAL | 0 refills | Status: DC | PRN

## 2022-01-12 NOTE — Discharge Instructions (Addendum)
You have been given a shot of Toradol 30 mg today.  Take ibuprofen 800 mg--1 tab every 8 hours as needed for pain.   Take tizanidine 4 mg--1 every 8 hours as needed for muscle spasm.  This medication can make you sleepy or dizzy, and you may want to reserve it for nighttime

## 2022-01-12 NOTE — ED Triage Notes (Signed)
Pt reports lt leg pain for 3 weeks.

## 2022-01-12 NOTE — ED Provider Notes (Addendum)
. [place]    CSN: [id number] Arrival date & time: 01/12/22  1901      History   Chief Complaint Chief Complaint  Patient presents with   Leg Pain    HPI Samantha Banks is a 61 y.o. female.    Leg Pain  Here with a 3-week history of leg pain  She notes that for 3 weeks she has been hurting behind her knees.  It is in her upper calf and her lower thigh also.  She is read about Baker's cyst and wonders if that is what is causing it.  No fever or recent trauma.  No rash.  She does not take any medicines except Tylenol.  Past Medical History:  Diagnosis Date   Allergic rhinitis    Arthritis    Headache    chronic migraines    No pertinent past medical history     Patient Active Problem List   Diagnosis Date Noted   Atypical chest pain 01/23/2019   Arthritis 01/22/2017   Swelling of limb 01/22/2017   Pain in limb 01/22/2017   Chronic tension-type headache, not intractable 01/09/2016   Anxiety state 01/05/2016   Lumbar radiculopathy 01/05/2016   Migraine without aura and without status migrainosus, not intractable 01/05/2016   Paresthesia 01/05/2016   Vocal cord dysfunction 01/05/2016   Chronic migraine 04/13/2015   Seasonal allergic rhinitis due to pollen 03/30/2015   Dyspnea 09/20/2014   Chest wall pain 09/20/2014    Past Surgical History:  Procedure Laterality Date   CESAREAN SECTION     COLONOSCOPY WITH PROPOFOL N/A 12/17/2016   Procedure: COLONOSCOPY WITH PROPOFOL;  Surgeon: Lollie Sails, MD;  Location: [facility] ENDOSCOPY;  Service: Endoscopy;  Laterality: N/A;    OB History     Gravida  3   Para  3   Term  3   Preterm  0   AB  0   Living  3      SAB  0   IAB  0   Ectopic  0   Multiple  0   Live Births               Home Medications    Prior to Admission medications   Medication Sig Start Date End Date Taking? Authorizing Provider  ibuprofen (ADVIL) 800 MG tablet Take 1 tablet (800 mg total) by mouth  every 8 (eight) hours as needed (pain). 01/12/22  Yes Niomie Englert, Gwenlyn Perking, MD  tiZANidine (ZANAFLEX) 4 MG tablet Take 1 tablet (4 mg total) by mouth every 8 (eight) hours as needed for muscle spasms. 01/12/22  Yes Barrett Henle, MD  pantoprazole (PROTONIX) 20 MG tablet Take 1 tablet (20 mg total) by mouth daily. 01/26/21 02/25/21  Arnaldo Natal, MD    Family History Family History  Problem Relation Age of Onset   Throat cancer Mother    Osteoarthritis Mother    Hypertension Father    Diabetes Mellitus II Sister    Asthma Sister    Other Neg Hx    Stomach cancer Neg Hx    Pancreatic cancer Neg Hx    Colon cancer Neg Hx     Social History Social History   Tobacco Use   Smoking status: Never   Smokeless tobacco: Never  Vaping Use   Vaping Use: Never used  Substance Use Topics   Alcohol use: No    Alcohol/week: 0.0 standard drinks of alcohol   Drug use: No  Allergies   Latex and Contrast media [iodinated contrast media]   Review of Systems Review of Systems   Physical Exam Triage Vital Signs ED Triage Vitals  Enc Vitals Group     BP 01/12/22 2000 (!) 142/62     Pulse Rate 01/12/22 2000 66     Resp 01/12/22 2000 18     Temp 01/12/22 2000 99 F (37.2 C)     Temp src --      SpO2 01/12/22 2000 100 %     Weight --      Height --      Head Circumference --      Peak Flow --      Pain Score 01/12/22 1958 8     Pain Loc --      Pain Edu? --      Excl. in [place]? --    No data found.  Updated Vital Signs BP (!) 142/62   Pulse 66   Temp 99 F (37.2 C)   Resp 18   LMP 02/01/2012   SpO2 100%   Visual Acuity Right Eye Distance:   Left Eye Distance:   Bilateral Distance:    Right Eye Near:   Left Eye Near:    Bilateral Near:     Physical Exam Vitals reviewed.  Constitutional:      General: She is not in acute distress.    Appearance: She is not ill-appearing, toxic-appearing or diaphoretic.  HENT:     Mouth/Throat:     Mouth: Mucous membranes are  moist.  Eyes:     Extraocular Movements: Extraocular movements intact.     Pupils: Pupils are equal, round, and reactive to light.  Cardiovascular:     Rate and Rhythm: Normal rate and regular rhythm.  Pulmonary:     Breath sounds: Normal breath sounds.  Musculoskeletal:     Comments: She is tender behind both knees and onto the proximal calves only and the proximal thighs.  There is possibly a Baker's cyst in her left popliteal fossa.  I cannot discern 1 in her right.  There is no Homans, and no calf tenderness.  Neurological:     Mental Status: She is alert.      UC Treatments / Results  Labs (all labs ordered are listed, but only abnormal results are displayed) Labs Reviewed - No data to display  EKG   Radiology No results found.  Procedures Procedures (including critical care time)  Medications Ordered in UC Medications  ketorolac (TORADOL) 30 MG/ML injection 30 mg (has no administration in time range)    Initial Impression / Assessment and Plan / UC Course  I have reviewed the triage vital signs and the nursing notes.  Pertinent labs & imaging results that were available during my care of the patient were reviewed by me and considered in my medical decision making (see chart for details).    This seems to be soft tissue problem, such as strain of her hamstrings could also be a Baker's cyst in part. Assistance requested to help her find a PCP.  Anti-inflammatory and muscle relaxers prescribed Final Clinical Impressions(s) / UC Diagnoses   Final diagnoses:  Pain in both lower extremities     Discharge Instructions      You have been given a shot of Toradol 30 mg today.  Take ibuprofen 800 mg--1 tab every 8 hours as needed for pain.   Take tizanidine 4 mg--1 every 8 hours as needed for muscle spasm.  This medication can make you sleepy or dizzy, and you may want to reserve it for nighttime       ED Prescriptions     Medication Sig Dispense Auth.  Provider   ibuprofen (ADVIL) 800 MG tablet Take 1 tablet (800 mg total) by mouth every 8 (eight) hours as needed (pain). 21 tablet Dawsyn Ramsaran, Gwenlyn Perking, MD   tiZANidine (ZANAFLEX) 4 MG tablet Take 1 tablet (4 mg total) by mouth every 8 (eight) hours as needed for muscle spasms. 30 tablet Lilo Wallington, Gwenlyn Perking, MD      PDMP not reviewed this encounter.   Barrett Henle, MD 01/12/22 2014    Barrett Henle, MD 01/12/22 2015

## 2022-03-14 ENCOUNTER — Emergency Department (HOSPITAL_BASED_OUTPATIENT_CLINIC_OR_DEPARTMENT_OTHER)
Admission: EM | Admit: 2022-03-14 | Discharge: 2022-03-14 | Disposition: A | Discharge status: Home / Self Care | Payer: PRIVATE HEALTH INSURANCE | Attending: Emergency Medicine | Admitting: Emergency Medicine

## 2022-03-14 ENCOUNTER — Other Ambulatory Visit: Payer: Self-pay

## 2022-03-14 ENCOUNTER — Emergency Department (HOSPITAL_BASED_OUTPATIENT_CLINIC_OR_DEPARTMENT_OTHER): Payer: PRIVATE HEALTH INSURANCE | Admitting: Radiology

## 2022-03-14 ENCOUNTER — Encounter (HOSPITAL_BASED_OUTPATIENT_CLINIC_OR_DEPARTMENT_OTHER): Payer: Self-pay

## 2022-03-14 DIAGNOSIS — M79675 Pain in left toe(s): Secondary | ICD-10-CM | POA: Insufficient documentation

## 2022-03-14 DIAGNOSIS — R799 Abnormal finding of blood chemistry, unspecified: Secondary | ICD-10-CM | POA: Insufficient documentation

## 2022-03-14 DIAGNOSIS — Z91040 Latex allergy status: Secondary | ICD-10-CM | POA: Insufficient documentation

## 2022-03-14 LAB — CBG MONITORING, ED: Glucose-Capillary: 98 mg/dL (ref 70–99)

## 2022-03-14 MED ORDER — ONDANSETRON 4 MG PO TBDP
8.0000 mg | ORAL_TABLET | Freq: Once | ORAL | Status: AC
  Administered 2022-03-14: 8 mg via ORAL
  Filled 2022-03-14: qty 2

## 2022-03-14 MED ORDER — AMOXICILLIN-POT CLAVULANATE 875-125 MG PO TABS: 1.0000 | ORAL_TABLET | Freq: Two times a day (BID) | ORAL | 0 refills | Status: DC

## 2022-03-14 MED ORDER — OXYCODONE-ACETAMINOPHEN 5-325 MG PO TABS
1.0000 | ORAL_TABLET | Freq: Once | ORAL | Status: AC
  Administered 2022-03-14: 1 via ORAL
  Filled 2022-03-14: qty 1

## 2022-03-14 NOTE — ED Provider Notes (Signed)
[facility] EMERGENCY DEPT Provider Note   CSN: [id number] Arrival date & time: 03/14/22  1657     History  Chief Complaint  Patient presents with   Toe Pain    Samantha Banks is a 61 y.o. female.   Toe Pain     Patient presents due to left toe pain.  No medical history of diabetes.  The pain started 3 weeks ago, comes and goes worse with ambulation.  There is some associated skin discoloration, denies any fevers.  She was seen for this about a month ago  Home Medications Prior to Admission medications   Medication Sig Start Date End Date Taking? Authorizing Provider  ibuprofen (ADVIL) 800 MG tablet Take 1 tablet (800 mg total) by mouth every 8 (eight) hours as needed (pain). 01/12/22   Barrett Henle, MD  pantoprazole (PROTONIX) 20 MG tablet Take 1 tablet (20 mg total) by mouth daily. 01/26/21 02/25/21  Arnaldo Natal, MD  tiZANidine (ZANAFLEX) 4 MG tablet Take 1 tablet (4 mg total) by mouth every 8 (eight) hours as needed for muscle spasms. 01/12/22   Barrett Henle, MD      Allergies    Latex and Contrast media [iodinated contrast media]    Review of Systems   Review of Systems  Physical Exam Updated Vital Signs BP (!) 154/103 (BP Location: Right Arm)   Pulse 72   Temp 98.2 F (36.8 C) (Oral)   Resp 18   Ht '4\' 11"'$  (1.499 m)   Wt 71.7 kg   LMP 02/01/2012   SpO2 100%   BMI 31.91 kg/m  Physical Exam Vitals and nursing note reviewed. Exam conducted with a chaperone present.  Constitutional:      General: She is not in acute distress.    Appearance: Normal appearance.  HENT:     Head: Normocephalic and atraumatic.  Eyes:     General: No scleral icterus.    Extraocular Movements: Extraocular movements intact.     Pupils: Pupils are equal, round, and reactive to light.  Cardiovascular:     Pulses: Normal pulses.  Musculoskeletal:        General: Tenderness present. Normal range of motion.  Skin:    Capillary Refill: Capillary refill  takes less than 2 seconds.     Coloration: Skin is not jaundiced.     Comments: Please see photo.  Neurological:     Mental Status: She is alert. Mental status is at baseline.     Coordination: Coordination normal.          ED Results / Procedures / Treatments   Labs (all labs ordered are listed, but only abnormal results are displayed) Labs Reviewed  CBG MONITORING, ED    EKG None  Radiology No results found.  Procedures Procedures    Medications Ordered in ED Medications - No data to display  ED Course/ Medical Decision Making/ A&P                           Medical Decision Making Amount and/or Complexity of Data Reviewed Radiology: ordered.  Risk Prescription drug management.   Patient presents due to toe pain.  Patient is neurovascular intact, DP and PT 2+.  She has cap refill less than 2, no significant erythema or swelling.  She is able to flex and extend the toe with subjective pain but tolerates ROM.    No signs of ischemic limb on exam, there is  no surrounding erythema.  It appears to be mostly a blister, underlying infection is also possibility.  I think osteomyelitis is less likely. X-ray is negative for any acute process.  Agree with radiologist dictation.  I reviewed external records, patient is been on multiple antibiotics in the last few months for this, it appears to get better and then return.   Possible toe infection versus skin ulcer versus wound.  I will start patient on Augmentin and encouraged to continue the antifungal cream if its been improving.  Podiatry referral was provided.   Discussed HPI, physical exam and plan of care for this patient with attending Ezequiel Essex. The attending physician evaluated this patient as part of a shared visit and agrees with plan of care.          Final Clinical Impression(s) / ED Diagnoses Final diagnoses:  None    Rx / DC Orders ED Discharge Orders     None         Sherrill Raring, Hershal Coria 03/14/22 2227    Ezequiel Essex, MD 03/16/22 1110

## 2022-03-14 NOTE — ED Triage Notes (Signed)
Pt states on her 4th toe on her left foot, she had developed some fluid. Pt states it burst and is "open like a cut". Pt is concerned for infection.

## 2022-03-14 NOTE — Discharge Instructions (Signed)
You are seen today in the emergency department for toe pain.  Take Augmentin twice daily with food and water for the next 7 days.  Continue applying antifungal cream.  Please follow-up with podiatry, information above to schedule an appointment for about a week from now.  Follow-up with your primary next week for reevaluation.  Return to the ED for numbness, spreading redness, new concerning symptoms.

## 2022-03-14 NOTE — ED Notes (Signed)
Discharge paperwork given and verbally understood. 

## 2022-03-21 DIAGNOSIS — M25562 Pain in left knee: Secondary | ICD-10-CM | POA: Insufficient documentation

## 2022-03-21 DIAGNOSIS — M1712 Unilateral primary osteoarthritis, left knee: Secondary | ICD-10-CM | POA: Insufficient documentation

## 2022-07-09 ENCOUNTER — Encounter (HOSPITAL_BASED_OUTPATIENT_CLINIC_OR_DEPARTMENT_OTHER): Payer: Self-pay | Admitting: Emergency Medicine

## 2022-07-09 ENCOUNTER — Other Ambulatory Visit: Payer: Self-pay

## 2022-07-09 ENCOUNTER — Emergency Department (HOSPITAL_BASED_OUTPATIENT_CLINIC_OR_DEPARTMENT_OTHER)
Admission: EM | Admit: 2022-07-09 | Discharge: 2022-07-09 | Disposition: A | Discharge status: Home / Self Care | Payer: PRIVATE HEALTH INSURANCE | Attending: Emergency Medicine | Admitting: Emergency Medicine

## 2022-07-09 DIAGNOSIS — Z91040 Latex allergy status: Secondary | ICD-10-CM | POA: Insufficient documentation

## 2022-07-09 DIAGNOSIS — B349 Viral infection, unspecified: Secondary | ICD-10-CM | POA: Insufficient documentation

## 2022-07-09 DIAGNOSIS — Z1152 Encounter for screening for COVID-19: Secondary | ICD-10-CM | POA: Insufficient documentation

## 2022-07-09 LAB — RESP PANEL BY RT-PCR (RSV, FLU A&B, COVID)  RVPGX2
Influenza A by PCR: NEGATIVE
Influenza B by PCR: NEGATIVE
Resp Syncytial Virus by PCR: NEGATIVE
SARS Coronavirus 2 by RT PCR: NEGATIVE

## 2022-07-09 LAB — GROUP A STREP BY PCR: Group A Strep by PCR: NOT DETECTED

## 2022-07-09 LAB — CBG MONITORING, ED
Glucose-Capillary: 57 mg/dL — ABNORMAL LOW (ref 70–99)
Glucose-Capillary: 104 mg/dL — ABNORMAL HIGH (ref 70–99)

## 2022-07-09 MED ORDER — BENZONATATE 100 MG PO CAPS: 100.0000 mg | ORAL_CAPSULE | Freq: Four times a day (QID) | ORAL | 0 refills | Status: DC | PRN

## 2022-07-09 NOTE — Discharge Instructions (Signed)
Follow up with primary care for recheck  Return if any problems.

## 2022-07-09 NOTE — ED Notes (Signed)
Given gatorade to drink. Admits decreased appetite, last ate 1400.

## 2022-07-09 NOTE — ED Triage Notes (Signed)
Cough sore throat started yesterday.

## 2022-07-09 NOTE — ED Notes (Signed)
Pt agreeable with d/c plan as discussed by provider- this nurse has verbally reinforced d/c instructions and provided pt with written copy.  Pt acknowledges verbal understanding and denies any addl questions, concerns, needs- pt ambulatory independently at d/c with steady gait - no acute changes/distress noted.

## 2022-07-09 NOTE — ED Notes (Signed)
Tolerating PO fluids °

## 2022-07-09 NOTE — ED Provider Notes (Signed)
[facility] Provider Note   CSN: [id number] Arrival date & time: 07/09/22  1639     History  Chief Complaint  Patient presents with   Cough   Sore Throat    Samantha Banks is a 62 y.o. female.  Pt complains of a cough that started yesterday.   The history is provided by the patient. No language interpreter was used.  Cough Cough characteristics:  Non-productive Sputum characteristics:  Nondescript Severity:  Moderate Onset quality:  Gradual Duration:  1 day Timing:  Constant Progression:  Worsening Chronicity:  New Context: upper respiratory infection   Relieved by:  Nothing Worsened by:  Nothing Ineffective treatments:  None tried Associated symptoms: no chills and no fever   Sore Throat       Home Medications Prior to Admission medications   Medication Sig Start Date End Date Taking? Authorizing Provider  benzonatate (TESSALON PERLES) 100 MG capsule Take 1 capsule (100 mg total) by mouth every 6 (six) hours as needed for cough. 07/09/22 07/09/23 Yes Caryl Ada K, [name]  amoxicillin-clavulanate (AUGMENTIN) 875-125 MG tablet Take 1 tablet by mouth every 12 (twelve) hours. 03/14/22   Sherrill Raring, [name]  ibuprofen (ADVIL) 800 MG tablet Take 1 tablet (800 mg total) by mouth every 8 (eight) hours as needed (pain). 01/12/22   Barrett Henle, MD  pantoprazole (PROTONIX) 20 MG tablet Take 1 tablet (20 mg total) by mouth daily. 01/26/21 02/25/21  Arnaldo Natal, MD  tiZANidine (ZANAFLEX) 4 MG tablet Take 1 tablet (4 mg total) by mouth every 8 (eight) hours as needed for muscle spasms. 01/12/22   Barrett Henle, MD      Allergies    Latex and Contrast media [iodinated contrast media]    Review of Systems   Review of Systems  Constitutional:  Negative for chills and fever.  Respiratory:  Positive for cough.   All other systems reviewed and are negative.   Physical Exam Updated Vital Signs BP (!) 143/81 (BP Location:  Right Arm)   Pulse 78   Temp 98.1 F (36.7 C) (Oral)   Resp 18   LMP 02/01/2012   SpO2 100%  Physical Exam Vitals and nursing note reviewed.  Constitutional:      Appearance: She is well-developed.  HENT:     Head: Normocephalic.     Right Ear: Tympanic membrane normal.     Left Ear: Tympanic membrane normal.     Mouth/Throat:     Mouth: Mucous membranes are moist.  Cardiovascular:     Rate and Rhythm: Normal rate and regular rhythm.  Pulmonary:     Effort: Pulmonary effort is normal.  Abdominal:     General: There is no distension.     Palpations: Abdomen is soft.  Musculoskeletal:        General: Normal range of motion.     Cervical back: Normal range of motion.  Neurological:     General: No focal deficit present.     Mental Status: She is alert and oriented to person, place, and time.     ED Results / Procedures / Treatments   Labs (all labs ordered are listed, but only abnormal results are displayed) Labs Reviewed  CBG MONITORING, ED - Abnormal; Notable for the following components:      Result Value   Glucose-Capillary 57 (*)    All other components within normal limits  CBG MONITORING, ED - Abnormal; Notable for the following components:  Glucose-Capillary 104 (*)    All other components within normal limits  GROUP A STREP BY PCR  RESP PANEL BY RT-PCR (RSV, FLU A&B, COVID)  RVPGX2    EKG None  Radiology No results found.  Procedures Procedures    Medications Ordered in ED Medications - No data to display  ED Course/ Medical Decision Making/ A&P                             Medical Decision Making Pt complains of a cough and congestion   Amount and/or Complexity of Data Reviewed Labs: ordered. Decision-making details documented in ED Course.    Details: Labs ordered reviewed and interpreted.  Covid influenza and rsv are negative   Risk Prescription drug management. Risk Details: Pt symptoms most consistent with viral illness.  Pt given  rx for tessalon.  Pt advised to recheck with primary care            Final Clinical Impression(s) / ED Diagnoses Final diagnoses:  None    Rx / DC Orders ED Discharge Orders          Ordered    benzonatate (TESSALON PERLES) 100 MG capsule  Every 6 hours PRN        07/09/22 1841           An After Visit Summary was printed and given to the patient.    Fransico Meadow, [name] 07/09/22 [place], [place], DO 07/09/22 2256

## 2022-07-21 ENCOUNTER — Other Ambulatory Visit: Payer: Self-pay

## 2022-07-21 ENCOUNTER — Emergency Department
Admission: EM | Admit: 2022-07-21 | Discharge: 2022-07-21 | Disposition: A | Discharge status: Home / Self Care | Payer: 59 | Attending: Emergency Medicine | Admitting: Emergency Medicine

## 2022-07-21 DIAGNOSIS — M545 Low back pain, unspecified: Secondary | ICD-10-CM | POA: Insufficient documentation

## 2022-07-21 DIAGNOSIS — M79601 Pain in right arm: Secondary | ICD-10-CM | POA: Diagnosis not present

## 2022-07-21 DIAGNOSIS — R1030 Lower abdominal pain, unspecified: Secondary | ICD-10-CM | POA: Diagnosis not present

## 2022-07-21 DIAGNOSIS — R350 Frequency of micturition: Secondary | ICD-10-CM | POA: Insufficient documentation

## 2022-07-21 DIAGNOSIS — M5412 Radiculopathy, cervical region: Secondary | ICD-10-CM | POA: Insufficient documentation

## 2022-07-21 LAB — CBC
HCT: 44.3 % (ref 36.0–46.0)
Hemoglobin: 14.2 g/dL (ref 12.0–15.0)
MCH: 29.4 pg (ref 26.0–34.0)
MCHC: 32.1 g/dL (ref 30.0–36.0)
MCV: 91.7 fL (ref 80.0–100.0)
Platelets: 258 10*3/uL (ref 150–400)
RBC: 4.83 MIL/uL (ref 3.87–5.11)
RDW: 13.7 % (ref 11.5–15.5)
WBC: 9.9 10*3/uL (ref 4.0–10.5)
nRBC: 0 % (ref 0.0–0.2)

## 2022-07-21 LAB — URINALYSIS, ROUTINE W REFLEX MICROSCOPIC
Bilirubin Urine: NEGATIVE
Glucose, UA: NEGATIVE mg/dL
Hgb urine dipstick: NEGATIVE
Ketones, ur: NEGATIVE mg/dL
Nitrite: NEGATIVE
Protein, ur: NEGATIVE mg/dL
Specific Gravity, Urine: 1.02 (ref 1.005–1.030)
pH: 5 (ref 5.0–8.0)

## 2022-07-21 LAB — COMPREHENSIVE METABOLIC PANEL
ALT: 38 U/L (ref 0–44)
AST: 30 U/L (ref 15–41)
Albumin: 4.2 g/dL (ref 3.5–5.0)
Alkaline Phosphatase: 72 U/L (ref 38–126)
Anion gap: 8 (ref 5–15)
BUN: 15 mg/dL (ref 6–20)
CO2: 28 mmol/L (ref 22–32)
Calcium: 8.7 mg/dL — ABNORMAL LOW (ref 8.9–10.3)
Chloride: 105 mmol/L (ref 98–111)
Creatinine, Ser: 0.57 mg/dL (ref 0.44–1.00)
GFR, Estimated: >60 mL/min (ref >60)
Glucose, Bld: 105 mg/dL — ABNORMAL HIGH (ref 70–99)
Potassium: 4 mmol/L (ref 3.5–5.1)
Sodium: 141 mmol/L (ref 135–145)
Total Bilirubin: 0.6 mg/dL (ref 0.3–1.2)
Total Protein: 7.3 g/dL (ref 6.5–8.1)

## 2022-07-21 LAB — LIPASE, BLOOD: Lipase: 32 U/L (ref 11–51)

## 2022-07-21 MED ORDER — NAPROXEN 500 MG PO TABS
250.0000 mg | ORAL_TABLET | Freq: Once | ORAL | Status: AC
Start: 2022-07-21 — End: 2022-07-21
  Administered 2022-07-21: 250 mg via ORAL
  Filled 2022-07-21: qty 1

## 2022-07-21 MED ORDER — ACETAMINOPHEN 500 MG PO TABS
1000.0000 mg | ORAL_TABLET | Freq: Once | ORAL | Status: AC
  Administered 2022-07-21: 1000 mg via ORAL
  Filled 2022-07-21: qty 2

## 2022-07-21 NOTE — Discharge Instructions (Addendum)
Blood work was reassuring today.  Your blood sugar is not diagnostic of diabetes.  You can take Tylenol and Motrin for your pain.  It is important that you keep your primary care appointment.  You may need to have an MRI of your cervical spine if you continue to have symptoms.  May also need to be referred for physical therapy.

## 2022-07-21 NOTE — ED Notes (Signed)
Md Starleen Blue at pt bedside

## 2022-07-21 NOTE — ED Provider Notes (Signed)
[facility] Provider Note    Event Date/Time   First MD Initiated Contact with Patient 07/21/22 [phone]     (approximate)   History   Flank Pain   HPI  Samantha Banks is a 62 y.o. female past medical history of migraine headache who presents with arm pain.  Patient tells me that she has had pain on the entire right arm for quite some time.  Says she has seen outpatient doctors for it but has never been told what the issue is.  Seems to be getting worse over the last several weeks.  Pain radiates from the right side of the neck down all the way to the hand.  Travels down the back of the arm and is in all 5 fingers.  She occasionally has some numbness no significant weakness.  She also has some pain in the right low back down the buttock but no significant numbness or weakness in the legs.  No bowel bladder incontinence.  Patient also complains of some lower abdominal pain says this is going on for "a minute".  She is concerned she could be diabetic because of increasing urinary frequency denies frank dysuria or hematuria.  Denies fevers but has had some night sweats that have been going on "forever".  Patient does not have a primary care doctor currently but she has schedule an appointment for later this month.  Patient tells me that she just needs a checkup.  Her main concern is the right arm pain.  Patiently takes Tylenol Motrin for it.  Past Medical History:  Diagnosis Date   Allergic rhinitis    Arthritis    Headache    chronic migraines    No pertinent past medical history     Patient Active Problem List   Diagnosis Date Noted   Atypical chest pain 01/23/2019   Arthritis 01/22/2017   Swelling of limb 01/22/2017   Pain in limb 01/22/2017   Chronic tension-type headache, not intractable 01/09/2016   Anxiety state 01/05/2016   Lumbar radiculopathy 01/05/2016   Migraine without aura and without status migrainosus, not intractable 01/05/2016   Paresthesia  01/05/2016   Vocal cord dysfunction 01/05/2016   Chronic migraine 04/13/2015   Seasonal allergic rhinitis due to pollen 03/30/2015   Dyspnea 09/20/2014   Chest wall pain 09/20/2014     Physical Exam  Triage Vital Signs: ED Triage Vitals  Enc Vitals Group     BP 07/21/22 0812 (!) 163/80     Pulse Rate 07/21/22 0812 73     Resp 07/21/22 0812 18     Temp 07/21/22 0812 97.7 F (36.5 C)     Temp Source 07/21/22 0812 Oral     SpO2 07/21/22 0812 100 %     Weight 07/21/22 0813 158 lb 1.1 oz (71.7 kg)     Height 07/21/22 0813 4' 11"$  (1.499 m)     Head Circumference --      Peak Flow --      Pain Score 07/21/22 0813 8     Pain Loc --      Pain Edu? --      Excl. in [place]? --     Most recent vital signs: Vitals:   07/21/22 0812  BP: (!) 163/80  Pulse: 73  Resp: 18  Temp: 97.7 F (36.5 C)  SpO2: 100%     General: Awake, no distress.  CV:  Good peripheral perfusion.  Resp:  Normal effort.  Abd:  No  distention.  Abdomen is soft, mild suprapubic tenderness, no guarding Neuro:             Awake, Alert, Oriented x 3  Other:  No midline C-spine tenderness, mild right paraspinal cervical muscle tenderness, able to range left and right without difficulty 5/5 strength with elbow flexion, extension, wrist extension, grip and finger abduction bilateral upper extremities Sensation grossly intact in bilateral upper extremities 5 out of 5 strength hip flexion, plantarflexion dorsiflexion bilateral lower extremities Sensation gross intact in bilateral lower extremities   ED Results / Procedures / Treatments  Labs (all labs ordered are listed, but only abnormal results are displayed) Labs Reviewed  URINALYSIS, ROUTINE W REFLEX MICROSCOPIC - Abnormal; Notable for the following components:      Result Value   Color, Urine YELLOW (*)    APPearance HAZY (*)    Leukocytes,Ua MODERATE (*)    Bacteria, UA RARE (*)    All other components within normal limits  COMPREHENSIVE METABOLIC PANEL  - Abnormal; Notable for the following components:   Glucose, Bld 105 (*)    Calcium 8.7 (*)    All other components within normal limits  CBC  LIPASE, BLOOD     EKG     RADIOLOGY    PROCEDURES:  Critical Care performed: No  Procedures  The patient is on the cardiac monitor to evaluate for evidence of arrhythmia and/or significant heart rate changes.   MEDICATIONS ORDERED IN ED: Medications  acetaminophen (TYLENOL) tablet 1,000 mg (1,000 mg Oral Given 07/21/22 0858)  naproxen (NAPROSYN) tablet 250 mg (250 mg Oral Given 07/21/22 0858)     IMPRESSION / MDM / ASSESSMENT AND PLAN / ED COURSE  I reviewed the triage vital signs and the nursing notes.                              Patient's presentation is most consistent with acute, uncomplicated illness.  Differential diagnosis includes, but is not limited to, cervical radiculopathy, referred pain from the shoulder/rotator cuff, less likely cervical myelopathy, low suspicion for discitis/cervical epidural abscess  The patient is a 62 year old female who presents with several complaints today.  Her major complaint is right arm pain.  She has radicular pain rating from the right neck down the right arm with some numbness but no weakness.  This has been going on for quite some time she says patient has never been given a diagnosis.  She also complains of some pain in the right low back/right buttock as well as some lower abdominal pain polyuria and concern for diabetes.  Exam she has some tenderness over the right paraspinal musculature of the cervical spine.  No pain with range of the shoulder so I doubt this is rotator cuff pathology.  The actual arm itself is nonswollen nontender she has good radial pulse.  I assessed C5-T1 and patient has normal strength and sensation.  Patient has no midline lumbar tenderness and normal strength and sensation in her lower extremities exam is not consistent with either cervical or lumbar cord  compression.  Patient's abdomen is benign she has no CVA tenderness.  Reviewed patient's record and she has seen Dr. Melrose Nakayama in the past 3 years ago had EMG and was diagnosed with a radial mononeuropathy, as well as migraine headaches..  This could certainly be contributing to her symptoms today although I do suspect more of a cervical radiculopathy clinically.  Given chronicity of symptoms and  patient's lack of weakness or other red flag symptoms I do not think that she needs urgent imaging of the cervical or lumbar spines today.  I also have low suspicion for acute abdominal process given she cannot tell me exactly when this started but just has been going on for a while her exam is benign and labs without leukocytosis or abnormality of her LFTs or lipase.  Patient was concerned about diabetes blood sugar is around 100 here.  UA with no glucosuria.  Patient has primary care follow-up later this month.  Encouraged her to keep this appointment.  I did discuss with her that if her symptoms continue she may eventually need repeat MRI of the cervical spine (per Dr. Lannie Fields notes it appears that patient has had 1 in the past but I am not able to see these results).  Recommended Tylenol and NSAID use in the interim.  Otherwise appropriate for discharge.       FINAL CLINICAL IMPRESSION(S) / ED DIAGNOSES   Final diagnoses:  Cervical radiculopathy     Rx / DC Orders   ED Discharge Orders     None        Note:  This document was prepared using Dragon voice recognition software and may include unintentional dictation errors.   Rada Hay, MD 07/21/22 [phone]

## 2022-07-21 NOTE — ED Notes (Signed)
Pt given discharge instructions. Pt voiced understanding. Pt unable to sign, due to signing pad not working.

## 2022-07-21 NOTE — ED Triage Notes (Signed)
Pt here with right arm and back pain. Pt states the pain started week ago but has gotten worse. Pt also endorsees right flank pain as welll but denies N/V/D.

## 2022-08-07 ENCOUNTER — Ambulatory Visit: Payer: 59 | Admitting: Family

## 2022-08-13 ENCOUNTER — Ambulatory Visit (INDEPENDENT_AMBULATORY_CARE_PROVIDER_SITE_OTHER): Payer: 59 | Admitting: Family

## 2022-08-13 ENCOUNTER — Encounter: Payer: Self-pay | Admitting: Family

## 2022-08-13 VITALS — BP 124/68 | HR 78 | Ht 59.0 in | Wt 163.0 lb

## 2022-08-13 DIAGNOSIS — M5412 Radiculopathy, cervical region: Secondary | ICD-10-CM

## 2022-08-13 DIAGNOSIS — E782 Mixed hyperlipidemia: Secondary | ICD-10-CM | POA: Diagnosis not present

## 2022-08-13 DIAGNOSIS — G43709 Chronic migraine without aura, not intractable, without status migrainosus: Secondary | ICD-10-CM

## 2022-08-13 DIAGNOSIS — E559 Vitamin D deficiency, unspecified: Secondary | ICD-10-CM

## 2022-08-13 DIAGNOSIS — E039 Hypothyroidism, unspecified: Secondary | ICD-10-CM

## 2022-08-13 DIAGNOSIS — E538 Deficiency of other specified B group vitamins: Secondary | ICD-10-CM | POA: Diagnosis not present

## 2022-08-13 DIAGNOSIS — R7303 Prediabetes: Secondary | ICD-10-CM

## 2022-08-14 LAB — CBC WITH DIFFERENTIAL
Basophils Absolute: 0.1 10*3/uL (ref 0.0–0.2)
Basos: 1 %
EOS (ABSOLUTE): 0.4 10*3/uL (ref 0.0–0.4)
Eos: 4 %
Hematocrit: 45.1 % (ref 34.0–46.6)
Hemoglobin: 14.4 g/dL (ref 11.1–15.9)
Immature Grans (Abs): 0 10*3/uL (ref 0.0–0.1)
Immature Granulocytes: 0 %
Lymphocytes Absolute: 2.5 10*3/uL (ref 0.7–3.1)
Lymphs: 24 %
MCH: 29.4 pg (ref 26.6–33.0)
MCHC: 31.9 g/dL (ref 31.5–35.7)
MCV: 92 fL (ref 79–97)
Monocytes Absolute: 0.7 10*3/uL (ref 0.1–0.9)
Monocytes: 7 %
Neutrophils Absolute: 6.6 10*3/uL (ref 1.4–7.0)
Neutrophils: 64 %
RBC: 4.89 x10E6/uL (ref 3.77–5.28)
RDW: 12.6 % (ref 11.7–15.4)
WBC: 10.3 10*3/uL (ref 3.4–10.8)

## 2022-08-14 LAB — CMP14+EGFR
ALT: 29 IU/L (ref 0–32)
AST: 26 IU/L (ref 0–40)
Albumin/Globulin Ratio: 2 (ref 1.2–2.2)
Albumin: 4.7 g/dL (ref 3.8–4.9)
Alkaline Phosphatase: 90 IU/L (ref 44–121)
BUN/Creatinine Ratio: 19 (ref 12–28)
BUN: 11 mg/dL (ref 8–27)
Bilirubin Total: 0.2 mg/dL (ref 0.0–1.2)
CO2: 22 mmol/L (ref 20–29)
Calcium: 9.5 mg/dL (ref 8.7–10.3)
Chloride: 105 mmol/L (ref 96–106)
Creatinine, Ser: 0.57 mg/dL (ref 0.57–1.00)
Globulin, Total: 2.3 g/dL (ref 1.5–4.5)
Glucose: 88 mg/dL (ref 70–99)
Potassium: 4.7 mmol/L (ref 3.5–5.2)
Sodium: 144 mmol/L (ref 134–144)
Total Protein: 7 g/dL (ref 6.0–8.5)
eGFR: 104 mL/min/{1.73_m2} (ref >59)

## 2022-08-14 LAB — LIPID PANEL
Chol/HDL Ratio: 3.3 ratio (ref 0.0–4.4)
Cholesterol, Total: 169 mg/dL (ref 100–199)
HDL: 52 mg/dL (ref >39)
LDL Chol Calc (NIH): 105 mg/dL — ABNORMAL HIGH (ref 0–99)
Triglycerides: 64 mg/dL (ref 0–149)
VLDL Cholesterol Cal: 12 mg/dL (ref 5–40)

## 2022-08-14 LAB — HEMOGLOBIN A1C
Est. average glucose Bld gHb Est-mCnc: 120 mg/dL
Hgb A1c MFr Bld: 5.8 % — ABNORMAL HIGH (ref 4.8–5.6)

## 2022-08-14 LAB — VITAMIN D 25 HYDROXY (VIT D DEFICIENCY, FRACTURES): Vit D, 25-Hydroxy: 38.7 ng/mL (ref 30.0–100.0)

## 2022-08-14 LAB — VITAMIN B12: Vitamin B-12: 852 pg/mL (ref 232–1245)

## 2022-08-14 LAB — TSH: TSH: 1.37 u[IU]/mL (ref 0.450–4.500)

## 2022-08-16 ENCOUNTER — Encounter: Payer: Self-pay | Admitting: Family

## 2022-08-16 DIAGNOSIS — I739 Peripheral vascular disease, unspecified: Secondary | ICD-10-CM | POA: Insufficient documentation

## 2022-08-16 DIAGNOSIS — M4802 Spinal stenosis, cervical region: Secondary | ICD-10-CM | POA: Insufficient documentation

## 2022-08-16 DIAGNOSIS — M5412 Radiculopathy, cervical region: Secondary | ICD-10-CM | POA: Insufficient documentation

## 2022-08-16 NOTE — Assessment & Plan Note (Signed)
Double checking for labwork cause of her symptoms.  If WNL, will x-ray/consider Prednisone/Muscle Relaxers.

## 2022-08-16 NOTE — Progress Notes (Signed)
New Patient Office Visit  Subjective    Patient ID: Samantha Banks, female    DOB: 1961-04-08  Age: 62 y.o. MRN: [id number]  CC:  Chief Complaint  Patient presents with   Establish Care    New Patient    HPI Samantha Banks presents to establish care Patient has not been to PCP in a while, says that she needs to update a lot of her preventative care.    She says that she is mostly concerned because she has been having headaches and some numbness and tingling in her arms.  She went to the ED recently for these complaints.   No other concerns today.   Migraine  This is a new problem. The current episode started 1 to 4 weeks ago. The problem occurs daily. The problem has been unchanged. The pain is located in the Right unilateral region. The pain does not radiate. The pain quality is not similar to prior headaches. The quality of the pain is described as throbbing. The pain is at a severity of 6/10. Associated symptoms include neck pain and tingling. The symptoms are aggravated by bright light, caffeine withdrawal, chewing, fatigue, weather changes and noise. She has tried acetaminophen, NSAIDs, Excedrin, darkened room and cold packs for the symptoms. The treatment provided no relief.    Outpatient Encounter Medications as of 08/13/2022  Medication Sig   amoxicillin-clavulanate (AUGMENTIN) 875-125 MG tablet Take 1 tablet by mouth every 12 (twelve) hours.   benzonatate (TESSALON PERLES) 100 MG capsule Take 1 capsule (100 mg total) by mouth every 6 (six) hours as needed for cough.   ibuprofen (ADVIL) 800 MG tablet Take 1 tablet (800 mg total) by mouth every 8 (eight) hours as needed (pain).   [DISCONTINUED] pantoprazole (PROTONIX) 20 MG tablet Take 1 tablet (20 mg total) by mouth daily.   [DISCONTINUED] tiZANidine (ZANAFLEX) 4 MG tablet Take 1 tablet (4 mg total) by mouth every 8 (eight) hours as needed for muscle spasms.   No facility-administered encounter medications on file  as of 08/13/2022.    Past Medical History:  Diagnosis Date   Allergic rhinitis    Arthritis    Atypical chest pain 01/23/2019   Dyspnea 09/20/2014   Headache    chronic migraines    No pertinent past medical history     Past Surgical History:  Procedure Laterality Date   [place]   COLONOSCOPY WITH PROPOFOL N/A 12/17/2016   Procedure: COLONOSCOPY WITH PROPOFOL;  Surgeon: Lollie Sails, MD;  Location: [facility] ENDOSCOPY;  Service: Endoscopy;  Laterality: N/A;    Family History  Problem Relation Age of Onset   Throat cancer Mother    Osteoarthritis Mother    Hypertension Father    Diabetes Mellitus II Sister    Asthma Sister    Diabetes Daughter    Diabetes Daughter    Ulcers Daughter    Other Neg Hx    Stomach cancer Neg Hx    Pancreatic cancer Neg Hx    Colon cancer Neg Hx     Social History   Socioeconomic History   Marital status: Widowed    Spouse name: Not on file   Number of children: 3   Years of education: Not on file   Highest education level: Not on file  Occupational History   Occupation: cna  Tobacco Use   Smoking status: Never   Smokeless tobacco: Never  Vaping Use   Vaping Use: Never used  Substance and Sexual Activity   Alcohol use: No    Alcohol/week: 0.0 standard drinks of alcohol   Drug use: No   Sexual activity: Not Currently    Birth control/protection: None  Other Topics Concern   Not on file  Social History Narrative   Not on file   Social Determinants of Health   Financial Resource Strain: Not on file  Food Insecurity: Not on file  Transportation Needs: Not on file  Physical Activity: Not on file  Stress: Not on file  Social Connections: Not on file  Intimate Partner Violence: Not on file    Review of Systems  Musculoskeletal:  Positive for neck pain.  Neurological:  Positive for tingling and headaches.  All other systems reviewed and are negative.        Objective    BP 124/68   Pulse 78   Ht '4\' 11"'$  (1.499 m)   Wt 163 lb (73.9 kg)   LMP 02/01/2012   SpO2 98%   BMI 32.92 kg/m   Physical Exam Vitals and nursing note reviewed.  Constitutional:      Appearance: Normal appearance. She is obese.  HENT:     Head: Normocephalic and atraumatic.  Eyes:     Extraocular Movements: Extraocular movements intact.     Pupils: Pupils are equal, round, and reactive to light.  Cardiovascular:     Rate and Rhythm: Normal rate and regular rhythm.  Pulmonary:     Effort: Pulmonary effort is normal.  Musculoskeletal:        General: Normal range of motion.     Cervical back: Normal range of motion.  Neurological:     General: No focal deficit present.     Mental Status: She is alert and oriented to person, place, and time.  Psychiatric:        Mood and Affect: Mood normal.        Assessment & Plan:   Problem List Items Addressed This Visit     Chronic migraine without aura without status migrainosus, not intractable    Nurtec Samples Given.  Will check B12 and other labs today.   Pt will let me know if this is improves symptoms.       Relevant Orders   CBC With Differential (Completed)   CMP14+EGFR (Completed)   Cervical radiculopathy    Double checking for labwork cause of her symptoms.  If WNL, will x-ray/consider Prednisone/Muscle Relaxers.       Other Visit Diagnoses     B12 deficiency due to diet    -  Primary   Relevant Orders   Vitamin B12 (Completed)   Vitamin D deficiency, unspecified       Relevant Orders   VITAMIN D 25 Hydroxy (Vit-D Deficiency, Fractures) (Completed)   Prediabetes       Relevant Orders   Hemoglobin A1c (Completed)   Mixed hyperlipidemia       Relevant Orders   Lipid panel (Completed)   Hypothyroidism (acquired)       Relevant Orders   TSH (Completed)      Will get labs today- concerned for possible low B12 given headaches and paresthesias. Samples of Nurtec given.   Return in  about 2 weeks (around 08/27/2022).   Total time spent: 30 minutes  Mechele Claude, FNP  08/13/2022

## 2022-08-16 NOTE — Assessment & Plan Note (Signed)
Nurtec Samples Given.  Will check B12 and other labs today.   Pt will let me know if this is improves symptoms.

## 2022-08-23 ENCOUNTER — Ambulatory Visit: Payer: PRIVATE HEALTH INSURANCE | Admitting: Nurse Practitioner

## 2022-08-27 ENCOUNTER — Ambulatory Visit (INDEPENDENT_AMBULATORY_CARE_PROVIDER_SITE_OTHER): Payer: 59 | Admitting: Family

## 2022-08-27 ENCOUNTER — Encounter: Payer: Self-pay | Admitting: Family

## 2022-08-27 ENCOUNTER — Ambulatory Visit
Admission: RE | Admit: 2022-08-27 | Discharge: 2022-08-27 | Disposition: A | Discharge status: Home / Self Care | Payer: 59 | Source: Ambulatory Visit | Attending: Family | Admitting: Family

## 2022-08-27 ENCOUNTER — Other Ambulatory Visit
Admission: RE | Admit: 2022-08-27 | Discharge: 2022-08-27 | Disposition: A | Discharge status: Home / Self Care | Payer: 59 | Source: Ambulatory Visit | Attending: *Deleted | Admitting: *Deleted

## 2022-08-27 VITALS — BP 124/64 | HR 75 | Ht 59.0 in | Wt 166.0 lb

## 2022-08-27 DIAGNOSIS — G8929 Other chronic pain: Secondary | ICD-10-CM | POA: Diagnosis not present

## 2022-08-27 DIAGNOSIS — M25562 Pain in left knee: Secondary | ICD-10-CM | POA: Insufficient documentation

## 2022-08-27 DIAGNOSIS — M5412 Radiculopathy, cervical region: Secondary | ICD-10-CM | POA: Insufficient documentation

## 2022-08-27 DIAGNOSIS — R61 Generalized hyperhidrosis: Secondary | ICD-10-CM

## 2022-08-27 DIAGNOSIS — M50322 Other cervical disc degeneration at C5-C6 level: Secondary | ICD-10-CM | POA: Diagnosis not present

## 2022-08-27 DIAGNOSIS — M50323 Other cervical disc degeneration at C6-C7 level: Secondary | ICD-10-CM | POA: Diagnosis not present

## 2022-08-27 DIAGNOSIS — M1712 Unilateral primary osteoarthritis, left knee: Secondary | ICD-10-CM | POA: Diagnosis not present

## 2022-08-27 NOTE — Progress Notes (Signed)
Established Patient Office Visit  Subjective:  Patient ID: Samantha Banks, female    DOB: [birth year]  Age: 62 y.o. MRN: [id number]  Chief Complaint  Patient presents with   Follow-up    2 week follow up    Pt. Here today for 2 week n/p follow up.  Had labs done at that time, so we will review in detail today.  Labs: Prediabetes, elevated LDL.   Has still been having the issues with her arm and says that she has been having left knee pain.  Also says that she has been having issues with her night sweats.   No other concerns today.      No other concerns at this time.   Past Medical History:  Diagnosis Date   Allergic rhinitis    Arthritis    Atypical chest pain 01/23/2019   Dyspnea 09/20/2014   Headache    chronic migraines    No pertinent past medical history     Past Surgical History:  Procedure Laterality Date   [place]   COLONOSCOPY WITH PROPOFOL N/A 12/17/2016   Procedure: COLONOSCOPY WITH PROPOFOL;  Surgeon: Lollie Sails, MD;  Location: [facility] ENDOSCOPY;  Service: Endoscopy;  Laterality: N/A;    Social History   Socioeconomic History   Marital status: Widowed    Spouse name: Not on file   Number of children: 3   Years of education: Not on file   Highest education level: Not on file  Occupational History   Occupation: cna  Tobacco Use   Smoking status: Never   Smokeless tobacco: Never  Vaping Use   Vaping Use: Never used  Substance and Sexual Activity   Alcohol use: No    Alcohol/week: 0.0 standard drinks of alcohol   Drug use: No   Sexual activity: Not Currently    Birth control/protection: None  Other Topics Concern   Not on file  Social History Narrative   Not on file   Social Determinants of Health   Financial Resource Strain: Not on file  Food Insecurity: Not on file  Transportation Needs: Not on file  Physical Activity: Not on file  Stress: Not on file   Social Connections: Not on file  Intimate Partner Violence: Not on file    Family History  Problem Relation Age of Onset   Throat cancer Mother    Osteoarthritis Mother    Hypertension Father    Diabetes Mellitus II Sister    Asthma Sister    Diabetes Daughter    Diabetes Daughter    Ulcers Daughter    Other Neg Hx    Stomach cancer Neg Hx    Pancreatic cancer Neg Hx    Colon cancer Neg Hx     Allergies  Allergen Reactions   Iodinated Contrast Media Hives    Pt states hives with previous injection of IV contarst, IV benadryl given 1 hour prior to injection   Latex Rash and Other (See Comments)    Review of Systems  Musculoskeletal:  Positive for joint pain.  All other systems reviewed and are negative.      Objective:   BP 124/64   Pulse 75   Ht 4\' 11"  (1.499 m)   Wt 166 lb (75.3 kg)   LMP 02/01/2012   SpO2 98%   BMI 33.53 kg/m   Vitals:   08/27/22 0946  BP: 124/64  Pulse: 75  Height: 4\' 11"  (1.499 m)  Weight: 166 lb (75.3 kg)  SpO2: 98%  BMI (Calculated): 33.51    Physical Exam Vitals and nursing note reviewed.  Constitutional:      Appearance: Normal appearance. She is normal weight.  HENT:     Head: Normocephalic.  Eyes:     Pupils: Pupils are equal, round, and reactive to light.  Cardiovascular:     Rate and Rhythm: Normal rate.  Pulmonary:     Effort: Pulmonary effort is normal.  Musculoskeletal:     Right hand: Decreased strength. Decreased sensation.     Left knee: Swelling present.  Skin:    Capillary Refill: Capillary refill takes less than 2 seconds.  Neurological:     General: No focal deficit present.     Mental Status: She is alert and oriented to person, place, and time.  Psychiatric:        Attention and Perception: Attention and perception normal.        Mood and Affect: Mood and affect normal.        Speech: Speech normal.        Behavior: Behavior normal. Behavior is cooperative.        Thought Content: Thought  content normal.        Cognition and Memory: Cognition and memory normal.        Judgment: Judgment normal.      No results found for any visits on 08/27/22.  Recent Results (from the past 2160 hour(s))  Group A Strep by PCR     Status: None   Collection Time: 07/09/22  4:48 PM   Specimen: Throat; Sterile Swab  Result Value Ref Range   Group A Strep by PCR NOT DETECTED NOT DETECTED    Comment: Performed at [facility], [address] [place], [place] [address]  Resp panel by RT-PCR (RSV, Flu A&B, Covid) Anterior Nasal Swab     Status: None   Collection Time: 07/09/22  4:48 PM   Specimen: Anterior Nasal Swab  Result Value Ref Range   SARS Coronavirus 2 by RT PCR NEGATIVE NEGATIVE    Comment: (NOTE) SARS-CoV-2 target nucleic acids are NOT DETECTED.  The SARS-CoV-2 RNA is generally detectable in upper respiratory specimens during the acute phase of infection. The lowest concentration of SARS-CoV-2 viral copies this assay can detect is 138 copies/mL. A negative result does not preclude SARS-Cov-2 infection and should not be used as the sole basis for treatment or other patient management decisions. A negative result may occur with  improper specimen collection/handling, submission of specimen other than nasopharyngeal swab, presence of viral mutation(s) within the areas targeted by this assay, and inadequate number of viral copies(<138 copies/mL). A negative result must be combined with clinical observations, patient history, and epidemiological information. The expected result is Negative.  Fact Sheet for Patients:  EntrepreneurPulse.com.au  Fact Sheet for Healthcare Providers:  IncredibleEmployment.be  This test is no t yet approved or cleared by the [place] FDA and  has been authorized for detection and/or diagnosis of SARS-CoV-2 by FDA under an Emergency Use Authorization (EUA). This EUA will remain  in  effect (meaning this test can be used) for the duration of the COVID-19 declaration under Section 564(b)(1) of the Act, 21 U.S.C.section 360bbb-3(b)(1), unless the authorization is terminated  or revoked sooner.       Influenza A by PCR NEGATIVE NEGATIVE   Influenza B by PCR NEGATIVE NEGATIVE    Comment: (NOTE) The Xpert  Xpress SARS-CoV-2/FLU/RSV plus assay is intended as an aid in the diagnosis of influenza from Nasopharyngeal swab specimens and should not be used as a sole basis for treatment. Nasal washings and aspirates are unacceptable for Xpert Xpress SARS-CoV-2/FLU/RSV testing.  Fact Sheet for Patients: EntrepreneurPulse.com.au  Fact Sheet for Healthcare Providers: IncredibleEmployment.be  This test is not yet approved or cleared by the [place] FDA and has been authorized for detection and/or diagnosis of SARS-CoV-2 by FDA under an Emergency Use Authorization (EUA). This EUA will remain in effect (meaning this test can be used) for the duration of the COVID-19 declaration under Section 564(b)(1) of the Act, 21 U.S.C. section 360bbb-3(b)(1), unless the authorization is terminated or revoked.     Resp Syncytial Virus by PCR NEGATIVE NEGATIVE    Comment: (NOTE) Fact Sheet for Patients: EntrepreneurPulse.com.au  Fact Sheet for Healthcare Providers: IncredibleEmployment.be  This test is not yet approved or cleared by the [place] FDA and has been authorized for detection and/or diagnosis of SARS-CoV-2 by FDA under an Emergency Use Authorization (EUA). This EUA will remain in effect (meaning this test can be used) for the duration of the COVID-19 declaration under Section 564(b)(1) of the Act, 21 U.S.C. section 360bbb-3(b)(1), unless the authorization is terminated or revoked.  Performed at [facility], [address] [place], [place] [address]   POC CBG, ED      Status: Abnormal   Collection Time: 07/09/22  6:17 PM  Result Value Ref Range   Glucose-Capillary 57 (L) 70 - 99 mg/dL    Comment: Glucose reference range applies only to samples taken after fasting for at least 8 hours.  CBG monitoring, ED     Status: Abnormal   Collection Time: 07/09/22  7:01 PM  Result Value Ref Range   Glucose-Capillary 104 (H) 70 - 99 mg/dL    Comment: Glucose reference range applies only to samples taken after fasting for at least 8 hours.  CBC     Status: None   Collection Time: 07/21/22  8:15 AM  Result Value Ref Range   WBC 9.9 4.0 - 10.5 K/uL   RBC 4.83 3.87 - 5.11 MIL/uL   Hemoglobin 14.2 12.0 - 15.0 g/dL   HCT 44.3 36.0 - 46.0 %   MCV 91.7 80.0 - 100.0 fL   MCH 29.4 26.0 - 34.0 pg   MCHC 32.1 30.0 - 36.0 g/dL   RDW 13.7 11.5 - 15.5 %   Platelets 258 150 - 400 K/uL   nRBC 0.0 0.0 - 0.2 %    Comment: Performed at [facility], [address] [place],  [address]  Urinalysis, Routine w reflex microscopic -Urine, Clean Catch     Status: Abnormal   Collection Time: 07/21/22  8:15 AM  Result Value Ref Range   Color, Urine YELLOW (A) YELLOW   APPearance HAZY (A) CLEAR   Specific Gravity, Urine 1.020 1.005 - 1.030   pH 5.0 5.0 - 8.0   Glucose, UA NEGATIVE NEGATIVE mg/dL   Hgb urine dipstick NEGATIVE NEGATIVE   Bilirubin Urine NEGATIVE NEGATIVE   Ketones, ur NEGATIVE NEGATIVE mg/dL   Protein, ur NEGATIVE NEGATIVE mg/dL   Nitrite NEGATIVE NEGATIVE   Leukocytes,Ua MODERATE (A) NEGATIVE   RBC / HPF 0-5 0 - 5 RBC/hpf   WBC, UA 0-5 0 - 5 WBC/hpf   Bacteria, UA RARE (A) NONE SEEN   Squamous Epithelial / HPF 6-10 0 - 5 /HPF   Mucus PRESENT     Comment:  Performed at [facility], [address] [place], [place] [address]  Comprehensive metabolic panel     Status: Abnormal   Collection Time: 07/21/22  8:15 AM  Result Value Ref Range   Sodium 141 135 - 145 mmol/L   Potassium 4.0 3.5 - 5.1 mmol/L   Chloride 105 98 - 111 mmol/L    CO2 28 22 - 32 mmol/L   Glucose, Bld 105 (H) 70 - 99 mg/dL    Comment: Glucose reference range applies only to samples taken after fasting for at least 8 hours.   BUN 15 6 - 20 mg/dL   Creatinine, Ser 0.57 0.44 - 1.00 mg/dL   Calcium 8.7 (L) 8.9 - 10.3 mg/dL   Total Protein 7.3 6.5 - 8.1 g/dL   Albumin 4.2 3.5 - 5.0 g/dL   AST 30 15 - 41 U/L   ALT 38 0 - 44 U/L   Alkaline Phosphatase 72 38 - 126 U/L   Total Bilirubin 0.6 0.3 - 1.2 mg/dL   GFR, Estimated >60 >60 mL/min    Comment: (NOTE) Calculated using the CKD-EPI Creatinine Equation (2021)    Anion gap 8 5 - 15    Comment: Performed at [facility], [address] [place], [place] [address]  Lipase, blood     Status: None   Collection Time: 07/21/22  8:15 AM  Result Value Ref Range   Lipase 32 11 - 51 U/L    Comment: Performed at [facility], [address] [place],  [address]  Lipid panel     Status: Abnormal   Collection Time: 08/13/22  2:14 PM  Result Value Ref Range   Cholesterol, Total 169 100 - 199 mg/dL   Triglycerides 64 0 - 149 mg/dL   HDL 52 >39 mg/dL   VLDL Cholesterol Cal 12 5 - 40 mg/dL   LDL Chol Calc (NIH) 105 (H) 0 - 99 mg/dL   Chol/HDL Ratio 3.3 0.0 - 4.4 ratio    Comment:                                   T. Chol/HDL Ratio                                             Men  Women                               1/2 Avg.Risk  3.4    3.3                                   Avg.Risk  5.0    4.4                                2X Avg.Risk  9.6    7.1                                3X Avg.Risk 23.4   11.0   VITAMIN D 25 Hydroxy (Vit-D Deficiency, Fractures)     Status: None  Collection Time: 08/13/22  2:14 PM  Result Value Ref Range   Vit D, 25-Hydroxy 38.7 30.0 - 100.0 ng/mL    Comment: Vitamin D deficiency has been defined by the [place] practice guideline as a level of serum 25-OH vitamin D less than 20 ng/mL (1,2). The Endocrine Society went  on to further define vitamin D insufficiency as a level between 21 and 29 ng/mL (2). 1. IOM (Institute of Medicine). 2010. Dietary reference    intakes for calcium and D. [place]: The    [employer]. 2. Holick MF, Binkley [place], Bischoff-Ferrari HA, et al.    Evaluation, treatment, and prevention of vitamin D    deficiency: an Endocrine Society clinical practice    guideline. JCEM. 2011 Jul; 96(7):1911-30.   CBC With Differential     Status: None   Collection Time: 08/13/22  2:14 PM  Result Value Ref Range   WBC 10.3 3.4 - 10.8 x10E3/uL   RBC 4.89 3.77 - 5.28 x10E6/uL   Hemoglobin 14.4 11.1 - 15.9 g/dL   Hematocrit 45.1 34.0 - 46.6 %   MCV 92 79 - 97 fL   MCH 29.4 26.6 - 33.0 pg   MCHC 31.9 31.5 - 35.7 g/dL   RDW 12.6 11.7 - 15.4 %   Neutrophils 64 Not Estab. %   Lymphs 24 Not Estab. %   Monocytes 7 Not Estab. %   Eos 4 Not Estab. %   Basos 1 Not Estab. %   Neutrophils Absolute 6.6 1.4 - 7.0 x10E3/uL   Lymphocytes Absolute 2.5 0.7 - 3.1 x10E3/uL   Monocytes Absolute 0.7 0.1 - 0.9 x10E3/uL   EOS (ABSOLUTE) 0.4 0.0 - 0.4 x10E3/uL   Basophils Absolute 0.1 0.0 - 0.2 x10E3/uL   Immature Granulocytes 0 Not Estab. %   Immature Grans (Abs) 0.0 0.0 - 0.1 x10E3/uL  CMP14+EGFR     Status: None   Collection Time: 08/13/22  2:14 PM  Result Value Ref Range   Glucose 88 70 - 99 mg/dL   BUN 11 8 - 27 mg/dL   Creatinine, Ser 0.57 0.57 - 1.00 mg/dL   eGFR 104 >59 mL/min/1.73   BUN/Creatinine Ratio 19 12 - 28   Sodium 144 134 - 144 mmol/L   Potassium 4.7 3.5 - 5.2 mmol/L   Chloride 105 96 - 106 mmol/L   CO2 22 20 - 29 mmol/L   Calcium 9.5 8.7 - 10.3 mg/dL   Total Protein 7.0 6.0 - 8.5 g/dL   Albumin 4.7 3.8 - 4.9 g/dL   Globulin, Total 2.3 1.5 - 4.5 g/dL   Albumin/Globulin Ratio 2.0 1.2 - 2.2   Bilirubin Total 0.2 0.0 - 1.2 mg/dL   Alkaline Phosphatase 90 44 - 121 IU/L   AST 26 0 - 40 IU/L   ALT 29 0 - 32 IU/L  TSH     Status: None   Collection Time: 08/13/22  2:14  PM  Result Value Ref Range   TSH 1.370 0.450 - 4.500 uIU/mL  Hemoglobin A1c     Status: Abnormal   Collection Time: 08/13/22  2:14 PM  Result Value Ref Range   Hgb A1c MFr Bld 5.8 (H) 4.8 - 5.6 %    Comment:          Prediabetes: 5.7 - 6.4          Diabetes: >6.4          Glycemic control for adults with diabetes: <7.0  Est. average glucose Bld gHb Est-mCnc 120 mg/dL  Vitamin B12     Status: None   Collection Time: 08/13/22  2:14 PM  Result Value Ref Range   Vitamin B-12 852 232 - 1,245 pg/mL      Assessment & Plan:   Problem List Items Addressed This Visit     Cervical radiculopathy   Relevant Orders   DG Cervical Spine Complete   Other Visit Diagnoses     Hyperhidrosis    -  Primary   Relevant Orders   FSH+Prog+E2+SHBG   Testosterone, Total   Chronic pain of left knee       Relevant Orders   DG KNEE 3 VIEW LEFT       Return in about 1 month (around 09/27/2022) for F/U.   Total time spent: 20 minutes  Mechele Claude, FNP  08/27/2022

## 2022-08-28 ENCOUNTER — Encounter: Payer: Self-pay | Admitting: Family

## 2022-08-28 LAB — FSH/LH
FSH: 81.1 m[IU]/mL (ref 25.8–134.8)
LH: 46.6 m[IU]/mL (ref 7.7–58.5)

## 2022-08-28 LAB — ESTRADIOL: Estradiol: 20.7 pg/mL (ref 0.0–54.7)

## 2022-08-28 LAB — TESTOSTERONE,FREE AND TOTAL
Testosterone, Free: 1.5 pg/mL (ref 0.0–4.2)
Testosterone: 9 ng/dL (ref 4–50)

## 2022-08-28 LAB — SEX HORMONE BINDING GLOBULIN: Sex Hormone Binding: 43.1 nmol/L (ref 17.3–125.0)

## 2022-08-28 LAB — PROGESTERONE: Progesterone: 0.1 ng/mL

## 2022-09-12 ENCOUNTER — Encounter: Payer: Self-pay | Admitting: Family

## 2022-09-12 ENCOUNTER — Telehealth: Payer: Self-pay

## 2022-09-12 DIAGNOSIS — M25512 Pain in left shoulder: Secondary | ICD-10-CM

## 2022-09-12 DIAGNOSIS — G8929 Other chronic pain: Secondary | ICD-10-CM

## 2022-09-12 DIAGNOSIS — M5412 Radiculopathy, cervical region: Secondary | ICD-10-CM

## 2022-09-12 NOTE — Telephone Encounter (Signed)
Patient called stating she's never gotten her xray result back

## 2022-09-17 NOTE — Addendum Note (Signed)
Addended by: Grayling Congress on: 09/17/2022 10:53 AM   Modules accepted: Orders

## 2022-09-27 ENCOUNTER — Ambulatory Visit: Payer: 59 | Admitting: Family

## 2022-11-15 ENCOUNTER — Emergency Department: Payer: 59

## 2022-11-15 ENCOUNTER — Encounter: Payer: Self-pay | Admitting: Emergency Medicine

## 2022-11-15 DIAGNOSIS — R350 Frequency of micturition: Secondary | ICD-10-CM | POA: Insufficient documentation

## 2022-11-15 DIAGNOSIS — R519 Headache, unspecified: Secondary | ICD-10-CM | POA: Insufficient documentation

## 2022-11-15 DIAGNOSIS — R0789 Other chest pain: Secondary | ICD-10-CM | POA: Diagnosis not present

## 2022-11-15 DIAGNOSIS — R079 Chest pain, unspecified: Secondary | ICD-10-CM | POA: Diagnosis not present

## 2022-11-15 LAB — CBC
HCT: 45.6 % (ref 36.0–46.0)
Hemoglobin: 14.4 g/dL (ref 12.0–15.0)
MCH: 29.6 pg (ref 26.0–34.0)
MCHC: 31.6 g/dL (ref 30.0–36.0)
MCV: 93.8 fL (ref 80.0–100.0)
Platelets: 257 10*3/uL (ref 150–400)
RBC: 4.86 MIL/uL (ref 3.87–5.11)
RDW: 13.5 % (ref 11.5–15.5)
WBC: 12.8 10*3/uL — ABNORMAL HIGH (ref 4.0–10.5)
nRBC: 0 % (ref 0.0–0.2)

## 2022-11-15 LAB — BASIC METABOLIC PANEL
Anion gap: 9 (ref 5–15)
BUN: 12 mg/dL (ref 6–20)
CO2: 25 mmol/L (ref 22–32)
Calcium: 8.8 mg/dL — ABNORMAL LOW (ref 8.9–10.3)
Chloride: 106 mmol/L (ref 98–111)
Creatinine, Ser: 0.59 mg/dL (ref 0.44–1.00)
GFR, Estimated: >60 mL/min (ref >60)
Glucose, Bld: 89 mg/dL (ref 70–99)
Potassium: 3.6 mmol/L (ref 3.5–5.1)
Sodium: 140 mmol/L (ref 135–145)

## 2022-11-15 LAB — URINALYSIS, ROUTINE W REFLEX MICROSCOPIC
Bilirubin Urine: NEGATIVE
Glucose, UA: NEGATIVE mg/dL
Hgb urine dipstick: NEGATIVE
Ketones, ur: NEGATIVE mg/dL
Nitrite: NEGATIVE
Protein, ur: NEGATIVE mg/dL
Specific Gravity, Urine: 1.009 (ref 1.005–1.030)
pH: 7 (ref 5.0–8.0)

## 2022-11-15 LAB — TROPONIN I (HIGH SENSITIVITY): Troponin I (High Sensitivity): <2 ng/L (ref <18)

## 2022-11-15 NOTE — ED Triage Notes (Addendum)
Pt presents ambulatory to triage via POV with complaints of headache and mid-sternal CP x 3 weeks. Pt last took tylenol last night - rates the pain 8/10. Describes the pressure in her head as "someone squeezing my head" and her CP is "burning" in nature. Of note, pt endorses frequent urination over the last week. A&Ox4 at this time. Denies vision changes, or SOB.

## 2022-11-16 ENCOUNTER — Emergency Department
Admission: EM | Admit: 2022-11-16 | Discharge: 2022-11-16 | Disposition: A | Discharge status: Home / Self Care | Payer: 59 | Attending: Emergency Medicine | Admitting: Emergency Medicine

## 2022-11-16 DIAGNOSIS — R0789 Other chest pain: Secondary | ICD-10-CM

## 2022-11-16 LAB — HEPATIC FUNCTION PANEL
ALT: 33 U/L (ref 0–44)
AST: 29 U/L (ref 15–41)
Albumin: 4.4 g/dL (ref 3.5–5.0)
Alkaline Phosphatase: 78 U/L (ref 38–126)
Bilirubin, Direct: <0.1 mg/dL (ref 0.0–0.2)
Total Bilirubin: 0.4 mg/dL (ref 0.3–1.2)
Total Protein: 7.4 g/dL (ref 6.5–8.1)

## 2022-11-16 LAB — TROPONIN I (HIGH SENSITIVITY): Troponin I (High Sensitivity): 2 ng/L (ref <18)

## 2022-11-16 LAB — LIPASE, BLOOD: Lipase: 30 U/L (ref 11–51)

## 2022-11-16 MED ORDER — KETOROLAC TROMETHAMINE 30 MG/ML IJ SOLN
30.0000 mg | Freq: Once | INTRAMUSCULAR | Status: AC
  Administered 2022-11-16: 30 mg via INTRAMUSCULAR
  Filled 2022-11-16: qty 1

## 2022-11-16 NOTE — ED Provider Notes (Signed)
[facility] Provider Note    Event Date/Time   First MD Initiated Contact with Patient 11/16/22 0030     (approximate)   History   Chest Pain and Headache   HPI  Samantha Banks is a 62 y.o. female who presents to the ED for evaluation of Chest Pain and Headache   I review a PCP visit from 3/18.  Following up after some outpatient labs.  Prediabetes and elevated LDL.  Chronic migraines.  No regular prescriptions, generally healthy.  Patient presents to the ED for evaluation of subacute intermittent chest discomforts and headaches.  She reports she often get these headaches, but the central chest discomfort has been unusual.  She reports she often feels it when she lays down in bed at night.  Denies any exertional chest discomfort, syncope, dyspnea, cough, fever  Reports urinary frequency at night as well with up to 4 times at night she needs to void without dysuria, hematuria or incontinence.  Physical Exam   Triage Vital Signs: ED Triage Vitals  Enc Vitals Group     BP 11/15/22 1955 (!) 151/83     Pulse Rate 11/15/22 1955 68     Resp 11/15/22 1955 20     Temp 11/15/22 1955 97.9 F (36.6 C)     Temp Source 11/15/22 1955 Oral     SpO2 11/15/22 1955 100 %     Weight 11/15/22 1949 163 lb 2.3 oz (74 kg)     Height 11/15/22 1949 4\' 11"  (1.499 m)     Head Circumference --      Peak Flow --      Pain Score 11/15/22 1948 8     Pain Loc --      Pain Edu? --      Excl. in GC? --     Most recent vital signs: Vitals:   11/16/22 0120 11/16/22 0130  BP: 133/85 137/82  Pulse: (!) 57 61  Resp: 15 14  Temp:    SpO2: 98% 100%    General: Awake, no distress.  Pleasant and conversational in full sentences.  Looks well CV:  Good peripheral perfusion.  RRR without appreciable murmur Resp:  Normal effort.  Abd:  No distention.  MSK:  No deformity noted.  Reproducible chest pain on palpation without overlying skin changes to the midsternal  chest. Neuro:  No focal deficits appreciated. Cranial nerves II through XII intact 5/5 strength and sensation in all 4 extremities Other:     ED Results / Procedures / Treatments   Labs (all labs ordered are listed, but only abnormal results are displayed) Labs Reviewed  BASIC METABOLIC PANEL - Abnormal; Notable for the following components:      Result Value   Calcium 8.8 (*)    All other components within normal limits  CBC - Abnormal; Notable for the following components:   WBC 12.8 (*)    All other components within normal limits  URINALYSIS, ROUTINE W REFLEX MICROSCOPIC - Abnormal; Notable for the following components:   Color, Urine STRAW (*)    APPearance CLEAR (*)    Leukocytes,Ua SMALL (*)    Bacteria, UA RARE (*)    All other components within normal limits  HEPATIC FUNCTION PANEL  LIPASE, BLOOD  TROPONIN I (HIGH SENSITIVITY)  TROPONIN I (HIGH SENSITIVITY)    EKG Sinus rhythm with a rate of 63 bpm.  Normal axis and intervals.  No clear signs of acute ischemia  RADIOLOGY CT head  interpreted by me without evidence of acute intracranial pathology CXR interpreted by me without evidence of acute cardiopulmonary pathology.  Official radiology report(s): CT Head Wo Contrast  Result Date: 11/15/2022 CLINICAL DATA:  Headaches and chest pain, initial encounter EXAM: CT HEAD WITHOUT CONTRAST TECHNIQUE: Contiguous axial images were obtained from the base of the skull through the vertex without intravenous contrast. RADIATION DOSE REDUCTION: This exam was performed according to the departmental dose-optimization program which includes automated exposure control, adjustment of the mA and/or kV according to patient size and/or use of iterative reconstruction technique. COMPARISON:  05/27/2015 FINDINGS: Brain: No evidence of acute infarction, hemorrhage, hydrocephalus, extra-axial collection or mass lesion/mass effect. Vascular: No hyperdense vessel or unexpected calcification.  Skull: Normal. Negative for fracture or focal lesion. Sinuses/Orbits: No acute finding. Other: None. IMPRESSION: No acute intracranial abnormality noted. Electronically Signed   By: Alcide Clever M.D.   On: 11/15/2022 23:24   DG Chest 2 View  Result Date: 11/15/2022 CLINICAL DATA:  Chest pain 3 weeks EXAM: CHEST - 2 VIEW COMPARISON:  11/30/2021 FINDINGS: The heart size and mediastinal contours are within normal limits. Both lungs are clear. The visualized skeletal structures are unremarkable. IMPRESSION: No active cardiopulmonary disease. Electronically Signed   By: Alcide Clever M.D.   On: 11/15/2022 20:29    PROCEDURES and INTERVENTIONS:  .1-3 Lead EKG Interpretation  Performed by: Delton Prairie, MD Authorized by: Delton Prairie, MD     Interpretation: normal     ECG rate:  64   ECG rate assessment: normal     Rhythm: sinus rhythm     Ectopy: none     Conduction: normal     Medications  ketorolac (TORADOL) 30 MG/ML injection 30 mg (30 mg Intramuscular Given 11/16/22 0114)     IMPRESSION / MDM / ASSESSMENT AND PLAN / ED COURSE  I reviewed the triage vital signs and the nursing notes.  Differential diagnosis includes, but is not limited to, ACS, PTX, PNA, muscle strain/spasm, PE, dissection, anxiety, pleural effusion, poorly controlled diabetes  {Patient presents with symptoms of an acute illness or injury that is potentially life-threatening.  Patient presents with subacute atypical chest discomfort that I suspect is possibly muscular in etiology.  She looks well and has a benign workup.  She is fairly low risk.  EKG is nonischemic and 2 troponins are negative.  Metabolic panel with normal electrolytes.  CBC is noted to have a marginal leukocytosis but I doubt infectious etiology of her symptoms.  UA is clear as well as her CXR and CT head.  We will provide Toradol and reassess.  Clinical Course as of 11/16/22 0231  Fri Nov 16, 2022  0206 Reassessed.  Feeling better [DS]    Clinical  Course User Index [DS] Delton Prairie, MD     FINAL CLINICAL IMPRESSION(S) / ED DIAGNOSES   Final diagnoses:  Other chest pain     Rx / DC Orders   ED Discharge Orders     None        Note:  This document was prepared using Dragon voice recognition software and may include unintentional dictation errors.   Delton Prairie, MD 11/16/22 [phone]

## 2022-11-16 NOTE — Discharge Instructions (Signed)
Please take Tylenol and ibuprofen/Advil for your pain.  It is safe to take them together, or to alternate them every few hours.  Take up to 1000mg of Tylenol at a time, up to 4 times per day.  Do not take more than 4000 mg of Tylenol in 24 hours.  For ibuprofen, take 400-600 mg, 3 - 4 times per day.  

## 2022-11-22 ENCOUNTER — Encounter: Payer: Self-pay | Admitting: Family

## 2022-11-22 ENCOUNTER — Ambulatory Visit (INDEPENDENT_AMBULATORY_CARE_PROVIDER_SITE_OTHER): Payer: 59 | Admitting: Family

## 2022-11-22 VITALS — BP 118/76 | HR 64 | Ht 59.0 in | Wt 163.0 lb

## 2022-11-22 DIAGNOSIS — R7303 Prediabetes: Secondary | ICD-10-CM

## 2022-11-22 DIAGNOSIS — D487 Neoplasm of uncertain behavior of other specified sites: Secondary | ICD-10-CM | POA: Diagnosis not present

## 2022-11-22 DIAGNOSIS — G43709 Chronic migraine without aura, not intractable, without status migrainosus: Secondary | ICD-10-CM

## 2022-11-22 DIAGNOSIS — Z202 Contact with and (suspected) exposure to infections with a predominantly sexual mode of transmission: Secondary | ICD-10-CM

## 2022-11-22 DIAGNOSIS — E782 Mixed hyperlipidemia: Secondary | ICD-10-CM | POA: Diagnosis not present

## 2022-11-22 DIAGNOSIS — R21 Rash and other nonspecific skin eruption: Secondary | ICD-10-CM | POA: Diagnosis not present

## 2022-11-22 DIAGNOSIS — R202 Paresthesia of skin: Secondary | ICD-10-CM | POA: Diagnosis not present

## 2022-11-22 DIAGNOSIS — E538 Deficiency of other specified B group vitamins: Secondary | ICD-10-CM | POA: Diagnosis not present

## 2022-11-22 DIAGNOSIS — R42 Dizziness and giddiness: Secondary | ICD-10-CM | POA: Diagnosis not present

## 2022-11-22 DIAGNOSIS — E559 Vitamin D deficiency, unspecified: Secondary | ICD-10-CM

## 2022-11-22 DIAGNOSIS — I739 Peripheral vascular disease, unspecified: Secondary | ICD-10-CM

## 2022-11-22 DIAGNOSIS — R350 Frequency of micturition: Secondary | ICD-10-CM

## 2022-11-22 DIAGNOSIS — E039 Hypothyroidism, unspecified: Secondary | ICD-10-CM | POA: Diagnosis not present

## 2022-11-22 DIAGNOSIS — D492 Neoplasm of unspecified behavior of bone, soft tissue, and skin: Secondary | ICD-10-CM | POA: Diagnosis not present

## 2022-11-22 LAB — POCT URINALYSIS DIPSTICK
Bilirubin, UA: NEGATIVE
Blood, UA: NEGATIVE
Glucose, UA: NEGATIVE
Ketones, UA: NEGATIVE
Leukocytes, UA: NEGATIVE
Nitrite, UA: NEGATIVE
Protein, UA: NEGATIVE
Spec Grav, UA: 1.02 (ref 1.010–1.025)
Urobilinogen, UA: 0.2 E.U./dL
pH, UA: 7.5 (ref 5.0–8.0)

## 2022-11-22 LAB — POC CREATINE & ALBUMIN,URINE
Creatinine, POC: 50 mg/dL
Microalbumin Ur, POC: 10 mg/L

## 2022-11-22 MED ORDER — UBRELVY 100 MG PO TABS: 100.0000 mg | ORAL_TABLET | ORAL | 1 refills | Status: DC | PRN

## 2022-11-22 MED ORDER — OMEPRAZOLE 40 MG PO CPDR: 40.0000 mg | DELAYED_RELEASE_CAPSULE | Freq: Every day | ORAL | 1 refills | Status: DC

## 2022-11-22 NOTE — Progress Notes (Signed)
Established Patient Office Visit  Subjective:  Patient ID: Samantha Banks, female    DOB: 03-05-1961  Age: 62 y.o. MRN: [id number]  Chief Complaint  Patient presents with   Follow-up    Follow up     Patient is here for follow up.  She went to the emergency room this past weekend with chest pains.  She has been feeling better, but says that they did testing and everything was normal.   She would like a referral to Dermatology, as she has mole on her scalp that is bothering her and one on her leg as well.  She asks if we can send this for her.   Has been having generalized aches and pains, especially in her knees and her shoulders.  She needs labs today.  No other concerns at this time.   Past Medical History:  Diagnosis Date   Allergic rhinitis    Arthritis    Atypical chest pain 01/23/2019   Dyspnea 09/20/2014   Headache    chronic migraines    No pertinent past medical history     Past Surgical History:  Procedure Laterality Date   CESAREAN SECTION  1986   CESAREAN SECTION  1990   CESAREAN SECTION  1991   COLONOSCOPY WITH PROPOFOL N/A 12/17/2016   Procedure: COLONOSCOPY WITH PROPOFOL;  Surgeon: Christena Deem, MD;  Location: [facility] ENDOSCOPY;  Service: Endoscopy;  Laterality: N/A;    Social History   Socioeconomic History   Marital status: Widowed    Spouse name: Not on file   Number of children: 3   Years of education: Not on file   Highest education level: Not on file  Occupational History   Occupation: cna  Tobacco Use   Smoking status: Never   Smokeless tobacco: Never  Vaping Use   Vaping Use: Never used  Substance and Sexual Activity   Alcohol use: No    Alcohol/week: 0.0 standard drinks of alcohol   Drug use: No   Sexual activity: Not Currently    Birth control/protection: None  Other Topics Concern   Not on file  Social History Narrative   Not on file   Social Determinants of Health   Financial Resource Strain: Not on file   Food Insecurity: Not on file  Transportation Needs: Not on file  Physical Activity: Not on file  Stress: Not on file  Social Connections: Not on file  Intimate Partner Violence: Not on file    Family History  Problem Relation Age of Onset   Throat cancer Mother    Osteoarthritis Mother    Hypertension Father    Diabetes Mellitus II Sister    Asthma Sister    Diabetes Daughter    Diabetes Daughter    Ulcers Daughter    Other Neg Hx    Stomach cancer Neg Hx    Pancreatic cancer Neg Hx    Colon cancer Neg Hx     Allergies  Allergen Reactions   Iodinated Contrast Media Hives    Pt states hives with previous injection of IV contarst, IV benadryl given 1 hour prior to injection   Latex Rash and Other (See Comments)    Review of Systems  Constitutional:  Positive for malaise/fatigue.  Musculoskeletal:  Positive for joint pain, myalgias and neck pain.  All other systems reviewed and are negative.      Objective:   BP 118/76   Pulse 64   Ht 4\' 11"  (1.499 m)   Wt  163 lb (73.9 kg)   LMP 02/01/2012   SpO2 99%   BMI 32.92 kg/m   Vitals:   11/22/22 0929  BP: 118/76  Pulse: 64  Height: 4\' 11"  (1.499 m)  Weight: 163 lb (73.9 kg)  SpO2: 99%  BMI (Calculated): 32.9    Physical Exam Vitals and nursing note reviewed.  Constitutional:      Appearance: Normal appearance. She is normal weight.  HENT:     Head: Normocephalic.  Eyes:     Extraocular Movements: Extraocular movements intact.     Pupils: Pupils are equal, round, and reactive to light.  Cardiovascular:     Rate and Rhythm: Normal rate.  Pulmonary:     Effort: Pulmonary effort is normal.  Musculoskeletal:        General: Tenderness present.  Neurological:     General: No focal deficit present.     Mental Status: She is alert and oriented to person, place, and time. Mental status is at baseline.  Psychiatric:        Mood and Affect: Mood normal.        Behavior: Behavior normal.        Thought  Content: Thought content normal.        Judgment: Judgment normal.     Results for orders placed or performed in visit on 11/22/22  POCT urinalysis dipstick  Result Value Ref Range   Color, UA yellow    Clarity, UA clear    Glucose, UA Negative Negative   Bilirubin, UA neg    Ketones, UA neg    Spec Grav, UA 1.020 1.010 - 1.025   Blood, UA neg    pH, UA 7.5 5.0 - 8.0   Protein, UA Negative Negative   Urobilinogen, UA 0.2 0.2 or 1.0 E.U./dL   Nitrite, UA neg    Leukocytes, UA Negative Negative   Appearance clear    Odor none   POC CREATINE & ALBUMIN,URINE  Result Value Ref Range   Microalbumin Ur, POC 10 mg/L   Creatinine, POC 50 mg/dL   Albumin/Creatinine Ratio, Urine, POC 30-300     Recent Results (from the past 2160 hour(s))  Basic metabolic panel     Status: Abnormal   Collection Time: 11/15/22  7:56 PM  Result Value Ref Range   Sodium 140 135 - 145 mmol/L   Potassium 3.6 3.5 - 5.1 mmol/L   Chloride 106 98 - 111 mmol/L   CO2 25 22 - 32 mmol/L   Glucose, Bld 89 70 - 99 mg/dL    Comment: Glucose reference range applies only to samples taken after fasting for at least 8 hours.   BUN 12 6 - 20 mg/dL   Creatinine, Ser 8.29 0.44 - 1.00 mg/dL   Calcium 8.8 (L) 8.9 - 10.3 mg/dL   GFR, Estimated >56 >21 mL/min    Comment: (NOTE) Calculated using the CKD-EPI Creatinine Equation (2021)    Anion gap 9 5 - 15    Comment: Performed at [facility], [address] [place], [place] [address]  CBC     Status: Abnormal   Collection Time: 11/15/22  7:56 PM  Result Value Ref Range   WBC 12.8 (H) 4.0 - 10.5 K/uL   RBC 4.86 3.87 - 5.11 MIL/uL   Hemoglobin 14.4 12.0 - 15.0 g/dL   HCT 78.4 69.6 - 29.5 %   MCV 93.8 80.0 - 100.0 fL   MCH 29.6 26.0 - 34.0 pg   MCHC 31.6 30.0 -  36.0 g/dL   RDW 25.9 56.3 - 87.5 %   Platelets 257 150 - 400 K/uL   nRBC 0.0 0.0 - 0.2 %    Comment: Performed at [facility], [address] [place], [place] [address]  Troponin I  (High Sensitivity)     Status: None   Collection Time: 11/15/22  7:56 PM  Result Value Ref Range   Troponin I (High Sensitivity) <2 <18 ng/L    Comment: (NOTE) Elevated high sensitivity troponin I (hsTnI) values and significant  changes across serial measurements may suggest ACS but many other  chronic and acute conditions are known to elevate hsTnI results.  Refer to the "Links" section for chest pain algorithms and additional  guidance. Performed at [facility], [address] [place], [place] [address]   Urinalysis, Routine w reflex microscopic -Urine, Clean Catch     Status: Abnormal   Collection Time: 11/15/22  7:56 PM  Result Value Ref Range   Color, Urine STRAW (A) YELLOW   APPearance CLEAR (A) CLEAR   Specific Gravity, Urine 1.009 1.005 - 1.030   pH 7.0 5.0 - 8.0   Glucose, UA NEGATIVE NEGATIVE mg/dL   Hgb urine dipstick NEGATIVE NEGATIVE   Bilirubin Urine NEGATIVE NEGATIVE   Ketones, ur NEGATIVE NEGATIVE mg/dL   Protein, ur NEGATIVE NEGATIVE mg/dL   Nitrite NEGATIVE NEGATIVE   Leukocytes,Ua SMALL (A) NEGATIVE   RBC / HPF 0-5 0 - 5 RBC/hpf   WBC, UA 0-5 0 - 5 WBC/hpf   Bacteria, UA RARE (A) NONE SEEN   Squamous Epithelial / HPF 0-5 0 - 5 /HPF   Mucus PRESENT     Comment: Performed at [facility], [address] [place], [place] [address]  Hepatic function panel     Status: None   Collection Time: 11/15/22  7:56 PM  Result Value Ref Range   Total Protein 7.4 6.5 - 8.1 g/dL   Albumin 4.4 3.5 - 5.0 g/dL   AST 29 15 - 41 U/L   ALT 33 0 - 44 U/L   Alkaline Phosphatase 78 38 - 126 U/L   Total Bilirubin 0.4 0.3 - 1.2 mg/dL   Bilirubin, Direct <6.3 0.0 - 0.2 mg/dL   Indirect Bilirubin NOT CALCULATED 0.3 - 0.9 mg/dL    Comment: Performed at [facility], [address] [place], [place] [address]  Lipase, blood     Status: None   Collection Time: 11/15/22  7:56 PM  Result Value Ref Range   Lipase 30 11 - 51 U/L    Comment: Performed at  [facility], [address] [place], [place] [address]  Troponin I (High Sensitivity)     Status: None   Collection Time: 11/15/22 11:27 PM  Result Value Ref Range   Troponin I (High Sensitivity) 2 <18 ng/L    Comment: (NOTE) Elevated high sensitivity troponin I (hsTnI) values and significant  changes across serial measurements may suggest ACS but many other  chronic and acute conditions are known to elevate hsTnI results.  Refer to the "Links" section for chest pain algorithms and additional  guidance. Performed at [facility], [address] [place], [place] [address]   POC CREATINE & ALBUMIN,URINE     Status: None   Collection Time: 11/22/22 11:31 AM  Result Value Ref Range   Microalbumin Ur, POC 10 mg/L   Creatinine, POC 50 mg/dL   Albumin/Creatinine Ratio, Urine, POC 30-300   POCT urinalysis dipstick  Status: None   Collection Time: 11/22/22 11:31 AM  Result Value Ref Range   Color, UA yellow    Clarity, UA clear    Glucose, UA Negative Negative   Bilirubin, UA neg    Ketones, UA neg    Spec Grav, UA 1.020 1.010 - 1.025   Blood, UA neg    pH, UA 7.5 5.0 - 8.0   Protein, UA Negative Negative   Urobilinogen, UA 0.2 0.2 or 1.0 E.U./dL   Nitrite, UA neg    Leukocytes, UA Negative Negative   Appearance clear    Odor none        Assessment & Plan:   Problem List Items Addressed This Visit       Active Problems   Chronic migraine without aura without status migrainosus, not intractable   Relevant Medications   Ubrogepant (UBRELVY) 100 MG TABS   Peripheral vascular disease (HCC)   Other Visit Diagnoses     Neoplasm of skin of scalp    -  Primary   Relevant Orders   Ambulatory referral to Dermatology   ANA 12 Profile, Do All RDL   CBC With Differential   CMP14+EGFR   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       Relevant Orders   Ambulatory referral to Dermatology   ANA 12 Profile, Do All RDL   CBC With Differential    CMP14+EGFR   Urinary frequency       Relevant Orders   ANA 12 Profile, Do All RDL   CBC With Differential   CMP14+EGFR   POCT urinalysis dipstick (Completed)   POC CREATINE & ALBUMIN,URINE (Completed)   B12 deficiency due to diet       Relevant Orders   ANA 12 Profile, Do All RDL   CBC With Differential   CMP14+EGFR   Vitamin B12   Vitamin D deficiency, unspecified       Relevant Orders   ANA 12 Profile, Do All RDL   VITAMIN D 25 Hydroxy (Vit-D Deficiency, Fractures)   CBC With Differential   CMP14+EGFR   Prediabetes       Relevant Orders   ANA 12 Profile, Do All RDL   CBC With Differential   CMP14+EGFR   Hemoglobin A1c   Mixed hyperlipidemia       Relevant Orders   ANA 12 Profile, Do All RDL   Lipid panel   CBC With Differential   CMP14+EGFR   Hypothyroidism (acquired)       Relevant Orders   ANA 12 Profile, Do All RDL   CBC With Differential   CMP14+EGFR   TSH   Rash and nonspecific skin eruption       Relevant Orders   ANA 12 Profile, Do All RDL   Contact with and (suspected) exposure to infections with a predominantly sexual mode of transmission       Relevant Orders   HSV 1 and 2 Ab, IgG   RPR w/reflex to TrepSure   Acute Viral Hepatitis (HAV, HBV, HCV)   HIV Antibody (routine testing w rflx)   Paresthesias          Labs today.  Will discuss in detail at her follow up appointment. Referral has already been sent to Ortho for pt.   Return in about 2 weeks (around 12/06/2022).   Total time spent: 30 minutes  Miki Kins, FNP  11/22/2022   This document may have been prepared by [employer] Voice Recognition software and as  such may include unintentional dictation errors.

## 2022-12-01 DIAGNOSIS — R29701 NIHSS score 1: Secondary | ICD-10-CM | POA: Diagnosis not present

## 2022-12-01 DIAGNOSIS — Z91040 Latex allergy status: Secondary | ICD-10-CM | POA: Diagnosis not present

## 2022-12-01 DIAGNOSIS — M792 Neuralgia and neuritis, unspecified: Secondary | ICD-10-CM | POA: Insufficient documentation

## 2022-12-01 DIAGNOSIS — I6782 Cerebral ischemia: Secondary | ICD-10-CM | POA: Diagnosis not present

## 2022-12-01 DIAGNOSIS — Z91041 Radiographic dye allergy status: Secondary | ICD-10-CM | POA: Diagnosis not present

## 2022-12-01 DIAGNOSIS — G9689 Other specified disorders of central nervous system: Secondary | ICD-10-CM | POA: Diagnosis not present

## 2022-12-01 DIAGNOSIS — Z743 Need for continuous supervision: Secondary | ICD-10-CM | POA: Diagnosis not present

## 2022-12-01 DIAGNOSIS — R55 Syncope and collapse: Secondary | ICD-10-CM | POA: Diagnosis not present

## 2022-12-01 DIAGNOSIS — Z8669 Personal history of other diseases of the nervous system and sense organs: Secondary | ICD-10-CM | POA: Insufficient documentation

## 2022-12-01 DIAGNOSIS — I639 Cerebral infarction, unspecified: Secondary | ICD-10-CM | POA: Diagnosis not present

## 2022-12-01 DIAGNOSIS — E162 Hypoglycemia, unspecified: Secondary | ICD-10-CM | POA: Diagnosis not present

## 2022-12-01 DIAGNOSIS — M50123 Cervical disc disorder at C6-C7 level with radiculopathy: Secondary | ICD-10-CM | POA: Diagnosis not present

## 2022-12-01 DIAGNOSIS — M50322 Other cervical disc degeneration at C5-C6 level: Secondary | ICD-10-CM | POA: Diagnosis not present

## 2022-12-01 DIAGNOSIS — E161 Other hypoglycemia: Secondary | ICD-10-CM | POA: Diagnosis not present

## 2022-12-01 DIAGNOSIS — G43909 Migraine, unspecified, not intractable, without status migrainosus: Secondary | ICD-10-CM | POA: Diagnosis not present

## 2022-12-01 DIAGNOSIS — M1990 Unspecified osteoarthritis, unspecified site: Secondary | ICD-10-CM | POA: Diagnosis not present

## 2022-12-01 DIAGNOSIS — R531 Weakness: Secondary | ICD-10-CM | POA: Diagnosis not present

## 2022-12-01 DIAGNOSIS — R2990 Unspecified symptoms and signs involving the nervous system: Secondary | ICD-10-CM | POA: Diagnosis not present

## 2022-12-01 DIAGNOSIS — I10 Essential (primary) hypertension: Secondary | ICD-10-CM | POA: Diagnosis not present

## 2022-12-01 DIAGNOSIS — R4701 Aphasia: Secondary | ICD-10-CM | POA: Diagnosis not present

## 2022-12-01 DIAGNOSIS — G459 Transient cerebral ischemic attack, unspecified: Secondary | ICD-10-CM | POA: Diagnosis not present

## 2022-12-02 ENCOUNTER — Encounter: Payer: Self-pay | Admitting: Family

## 2022-12-02 DIAGNOSIS — R2689 Other abnormalities of gait and mobility: Secondary | ICD-10-CM | POA: Insufficient documentation

## 2022-12-02 DIAGNOSIS — Z7409 Other reduced mobility: Secondary | ICD-10-CM | POA: Insufficient documentation

## 2022-12-02 DIAGNOSIS — M792 Neuralgia and neuritis, unspecified: Secondary | ICD-10-CM | POA: Diagnosis not present

## 2022-12-02 DIAGNOSIS — R55 Syncope and collapse: Secondary | ICD-10-CM | POA: Diagnosis not present

## 2022-12-02 DIAGNOSIS — R4701 Aphasia: Secondary | ICD-10-CM | POA: Diagnosis not present

## 2022-12-02 DIAGNOSIS — R2990 Unspecified symptoms and signs involving the nervous system: Secondary | ICD-10-CM | POA: Diagnosis not present

## 2022-12-02 DIAGNOSIS — Z789 Other specified health status: Secondary | ICD-10-CM | POA: Diagnosis not present

## 2022-12-02 DIAGNOSIS — Z8669 Personal history of other diseases of the nervous system and sense organs: Secondary | ICD-10-CM | POA: Diagnosis not present

## 2022-12-03 DIAGNOSIS — M5126 Other intervertebral disc displacement, lumbar region: Secondary | ICD-10-CM | POA: Diagnosis not present

## 2022-12-03 DIAGNOSIS — I081 Rheumatic disorders of both mitral and tricuspid valves: Secondary | ICD-10-CM | POA: Diagnosis not present

## 2022-12-03 DIAGNOSIS — G459 Transient cerebral ischemic attack, unspecified: Secondary | ICD-10-CM | POA: Diagnosis not present

## 2022-12-03 DIAGNOSIS — Z7409 Other reduced mobility: Secondary | ICD-10-CM | POA: Diagnosis not present

## 2022-12-03 DIAGNOSIS — R2689 Other abnormalities of gait and mobility: Secondary | ICD-10-CM | POA: Diagnosis not present

## 2022-12-03 DIAGNOSIS — M792 Neuralgia and neuritis, unspecified: Secondary | ICD-10-CM | POA: Diagnosis not present

## 2022-12-03 DIAGNOSIS — Z789 Other specified health status: Secondary | ICD-10-CM | POA: Diagnosis not present

## 2022-12-03 DIAGNOSIS — R2990 Unspecified symptoms and signs involving the nervous system: Secondary | ICD-10-CM | POA: Diagnosis not present

## 2022-12-03 DIAGNOSIS — Z8669 Personal history of other diseases of the nervous system and sense organs: Secondary | ICD-10-CM | POA: Diagnosis not present

## 2022-12-03 DIAGNOSIS — R4701 Aphasia: Secondary | ICD-10-CM | POA: Diagnosis not present

## 2022-12-04 LAB — TSH: TSH: 1.14 u[IU]/mL (ref 0.450–4.500)

## 2022-12-04 LAB — ACUTE VIRAL HEPATITIS (HAV, HBV, HCV)
HCV Ab: NONREACTIVE
Hep A IgM: NEGATIVE
Hep B C IgM: NEGATIVE
Hepatitis B Surface Ag: NEGATIVE

## 2022-12-04 LAB — TREPONEMAL ANTIBODIES, TPPA: Treponemal Antibodies, TPPA: REACTIVE — AB

## 2022-12-04 LAB — CBC WITH DIFFERENTIAL
Basophils Absolute: 0.1 10*3/uL (ref 0.0–0.2)
Basos: 1 %
EOS (ABSOLUTE): 0.3 10*3/uL (ref 0.0–0.4)
Eos: 3 %
Hematocrit: 40.4 % (ref 34.0–46.6)
Hemoglobin: 13.4 g/dL (ref 11.1–15.9)
Immature Grans (Abs): 0 10*3/uL (ref 0.0–0.1)
Immature Granulocytes: 0 %
Lymphocytes Absolute: 2 10*3/uL (ref 0.7–3.1)
Lymphs: 23 %
MCH: 30.4 pg (ref 26.6–33.0)
MCHC: 33.2 g/dL (ref 31.5–35.7)
MCV: 92 fL (ref 79–97)
Monocytes Absolute: 0.7 10*3/uL (ref 0.1–0.9)
Monocytes: 9 %
Neutrophils Absolute: 5.5 10*3/uL (ref 1.4–7.0)
Neutrophils: 64 %
RBC: 4.41 x10E6/uL (ref 3.77–5.28)
RDW: 12.6 % (ref 11.7–15.4)
WBC: 8.6 10*3/uL (ref 3.4–10.8)

## 2022-12-04 LAB — ANA TITER AND PATTERN
Speckled Pattern: 1:80 {titer} — ABNORMAL HIGH
Spindle Apparatus Pattern: 1:80 {titer} — ABNORMAL HIGH

## 2022-12-04 LAB — ANA 12 PROFILE, DO ALL RDL
Anti-Cardiolipin Ab, IgA (RDL): <12 APL U/mL (ref <12)
Anti-Cardiolipin Ab, IgG (RDL): <15 GPL U/mL (ref <15)
Anti-Cardiolipin Ab, IgM (RDL): <13 MPL U/mL (ref <13)
Anti-Centromere Ab (RDL): <1:40 {titer}
Anti-La (SS-B) Ab (RDL): <20 Units (ref <20)
Anti-Nuclear Ab by IFA (RDL): POSITIVE — AB
Anti-Ro (SS-A) Ab (RDL): <20 Units (ref <20)
Anti-Scl-70 Ab (RDL): <20 Units (ref <20)
Anti-Sm Ab (RDL): <20 Units (ref <20)
Anti-TPO Ab (RDL): <9 IU/mL (ref <9.0)
Anti-U1 RNP Ab (RDL): <20 Units (ref <20)
Anti-dsDNA Ab by Farr(RDL): <8 IU/mL (ref <8.0)
C3 Complement (RDL): 160 mg/dL (ref 82–167)
C4 Complement (RDL): 53 mg/dL — ABNORMAL HIGH (ref 14–44)

## 2022-12-04 LAB — CMP14+EGFR
ALT: 28 IU/L (ref 0–32)
AST: 22 IU/L (ref 0–40)
Albumin/Globulin Ratio: 2.1
Albumin: 4.7 g/dL (ref 3.8–4.9)
Alkaline Phosphatase: 101 IU/L (ref 44–121)
BUN/Creatinine Ratio: 17 (ref 12–28)
BUN: 11 mg/dL (ref 8–27)
Bilirubin Total: <0.2 mg/dL (ref 0.0–1.2)
CO2: 25 mmol/L (ref 20–29)
Calcium: 9.4 mg/dL (ref 8.7–10.3)
Chloride: 102 mmol/L (ref 96–106)
Creatinine, Ser: 0.63 mg/dL (ref 0.57–1.00)
Globulin, Total: 2.2 g/dL (ref 1.5–4.5)
Glucose: 83 mg/dL (ref 70–99)
Potassium: 4.2 mmol/L (ref 3.5–5.2)
Sodium: 143 mmol/L (ref 134–144)
Total Protein: 6.9 g/dL (ref 6.0–8.5)
eGFR: 101 mL/min/{1.73_m2} (ref >59)

## 2022-12-04 LAB — VITAMIN B12

## 2022-12-04 LAB — HCV INTERPRETATION

## 2022-12-04 LAB — LIPID PANEL
Chol/HDL Ratio: 3.5 ratio (ref 0.0–4.4)
Cholesterol, Total: 190 mg/dL (ref 100–199)
HDL: 54 mg/dL (ref >39)
LDL Chol Calc (NIH): 113 mg/dL — ABNORMAL HIGH (ref 0–99)
Triglycerides: 130 mg/dL (ref 0–149)
VLDL Cholesterol Cal: 23 mg/dL (ref 5–40)

## 2022-12-04 LAB — HEMOGLOBIN A1C
Est. average glucose Bld gHb Est-mCnc: 120 mg/dL
Hgb A1c MFr Bld: 5.8 % — ABNORMAL HIGH (ref 4.8–5.6)

## 2022-12-04 LAB — HIV ANTIBODY (ROUTINE TESTING W REFLEX): HIV Screen 4th Generation wRfx: NONREACTIVE

## 2022-12-04 LAB — HSV 1 AND 2 AB, IGG
HSV 1 Glycoprotein G Ab, IgG: 5.28 index — ABNORMAL HIGH (ref 0.00–0.90)
HSV 2 IgG, Type Spec: <0.91 index (ref 0.00–0.90)

## 2022-12-04 LAB — VITAMIN D 25 HYDROXY (VIT D DEFICIENCY, FRACTURES): Vit D, 25-Hydroxy: 27.6 ng/mL — ABNORMAL LOW (ref 30.0–100.0)

## 2022-12-04 LAB — RPR W/REFLEX TO TREPSURE: RPR: NONREACTIVE

## 2022-12-06 ENCOUNTER — Ambulatory Visit: Payer: 59 | Admitting: Family

## 2022-12-10 ENCOUNTER — Ambulatory Visit (INDEPENDENT_AMBULATORY_CARE_PROVIDER_SITE_OTHER): Payer: 59 | Admitting: Family

## 2022-12-10 ENCOUNTER — Encounter: Payer: Self-pay | Admitting: Family

## 2022-12-10 VITALS — BP 122/78 | HR 49 | Ht 59.0 in | Wt 164.2 lb

## 2022-12-10 DIAGNOSIS — I739 Peripheral vascular disease, unspecified: Secondary | ICD-10-CM

## 2022-12-10 DIAGNOSIS — M5412 Radiculopathy, cervical region: Secondary | ICD-10-CM | POA: Diagnosis not present

## 2022-12-14 NOTE — Progress Notes (Signed)
Established Patient Office Visit  Subjective:  Patient ID: Samantha Banks, female    DOB: 1960/08/26  Age: 62 y.o. MRN: [id number]  Chief Complaint  Patient presents with   Hospitalization Follow-up    Hospital follow-up    Patient is here today for follow up after recent hospitalization.  They at first thought that she might have been having a stroke, but she was eventually diagnosed with possible TIA vs Migraines, and cervical radiculopathy.   They suggested referrals to a Neurosurgeon and to Physical Therapy to help with her neck.  She is feeling well overall, but she cannot yet go back to work.   She already has a neurologist appointment set up, for follow up based on her TIAs or Migraines.   No other concerns at this time.   Past Medical History:  Diagnosis Date   Allergic rhinitis    Arthritis    Atypical chest pain 01/23/2019   Dyspnea 09/20/2014   Headache    chronic migraines    No pertinent past medical history     Past Surgical History:  Procedure Laterality Date   CESAREAN SECTION  1986   CESAREAN SECTION  1990   CESAREAN SECTION  1991   COLONOSCOPY WITH PROPOFOL N/A 12/17/2016   Procedure: COLONOSCOPY WITH PROPOFOL;  Surgeon: Christena Deem, MD;  Location: [facility] ENDOSCOPY;  Service: Endoscopy;  Laterality: N/A;    Social History   Socioeconomic History   Marital status: Widowed    Spouse name: Not on file   Number of children: 3   Years of education: Not on file   Highest education level: Not on file  Occupational History   Occupation: cna  Tobacco Use   Smoking status: Never   Smokeless tobacco: Never  Vaping Use   Vaping Use: Never used  Substance and Sexual Activity   Alcohol use: No    Alcohol/week: 0.0 standard drinks of alcohol   Drug use: No   Sexual activity: Not Currently    Birth control/protection: None  Other Topics Concern   Not on file  Social History Narrative   Not on file   Social Determinants of Health    Financial Resource Strain: Not on file  Food Insecurity: Not on file  Transportation Needs: Not on file  Physical Activity: Not on file  Stress: Not on file  Social Connections: Not on file  Intimate Partner Violence: Not on file    Family History  Problem Relation Age of Onset   Throat cancer Mother    Osteoarthritis Mother    Hypertension Father    Diabetes Mellitus II Sister    Asthma Sister    Diabetes Daughter    Diabetes Daughter    Ulcers Daughter    Other Neg Hx    Stomach cancer Neg Hx    Pancreatic cancer Neg Hx    Colon cancer Neg Hx     Allergies  Allergen Reactions   Iodinated Contrast Media Hives    Pt states hives with previous injection of IV contarst, IV benadryl given 1 hour prior to injection   Latex Rash and Other (See Comments)    Review of Systems  Constitutional:  Positive for malaise/fatigue.  Musculoskeletal:  Positive for neck pain.  Neurological:  Positive for tingling and weakness (in left arm specifically).  All other systems reviewed and are negative.      Objective:   BP 122/78   Pulse (!) 49   Ht 4\' 11"  (1.499 m)  Wt 164 lb 3.2 oz (74.5 kg)   LMP 02/01/2012   SpO2 97%   BMI 33.16 kg/m   Vitals:   12/10/22 1518  BP: 122/78  Pulse: (!) 49  Height: 4\' 11"  (1.499 m)  Weight: 164 lb 3.2 oz (74.5 kg)  SpO2: 97%  BMI (Calculated): 33.15    Physical Exam Vitals and nursing note reviewed.  Constitutional:      Appearance: Normal appearance. She is normal weight.  HENT:     Head: Normocephalic.  Eyes:     Extraocular Movements: Extraocular movements intact.     Conjunctiva/sclera: Conjunctivae normal.     Pupils: Pupils are equal, round, and reactive to light.  Cardiovascular:     Rate and Rhythm: Normal rate.  Pulmonary:     Effort: Pulmonary effort is normal.  Neurological:     General: No focal deficit present.     Mental Status: She is alert and oriented to person, place, and time. Mental status is at  baseline.  Psychiatric:        Mood and Affect: Mood normal.        Behavior: Behavior normal.        Thought Content: Thought content normal.        Judgment: Judgment normal.      No results found for any visits on 12/10/22.  Recent Results (from the past 2160 hour(s))  Basic metabolic panel     Status: Abnormal   Collection Time: 11/15/22  7:56 PM  Result Value Ref Range   Sodium 140 135 - 145 mmol/L   Potassium 3.6 3.5 - 5.1 mmol/L   Chloride 106 98 - 111 mmol/L   CO2 25 22 - 32 mmol/L   Glucose, Bld 89 70 - 99 mg/dL    Comment: Glucose reference range applies only to samples taken after fasting for at least 8 hours.   BUN 12 6 - 20 mg/dL   Creatinine, Ser 1.61 0.44 - 1.00 mg/dL   Calcium 8.8 (L) 8.9 - 10.3 mg/dL   GFR, Estimated >09 >60 mL/min    Comment: (NOTE) Calculated using the CKD-EPI Creatinine Equation (2021)    Anion gap 9 5 - 15    Comment: Performed at [facility], [address] [place], [place] [address]  CBC     Status: Abnormal   Collection Time: 11/15/22  7:56 PM  Result Value Ref Range   WBC 12.8 (H) 4.0 - 10.5 K/uL   RBC 4.86 3.87 - 5.11 MIL/uL   Hemoglobin 14.4 12.0 - 15.0 g/dL   HCT 81.1 91.4 - 78.2 %   MCV 93.8 80.0 - 100.0 fL   MCH 29.6 26.0 - 34.0 pg   MCHC 31.6 30.0 - 36.0 g/dL   RDW 95.6 21.3 - 08.6 %   Platelets 257 150 - 400 K/uL   nRBC 0.0 0.0 - 0.2 %    Comment: Performed at [facility], [address], [place], [place] [address]  Troponin I (High Sensitivity)     Status: None   Collection Time: 11/15/22  7:56 PM  Result Value Ref Range   Troponin I (High Sensitivity) <2 <18 ng/L    Comment: (NOTE) Elevated high sensitivity troponin I (hsTnI) values and significant  changes across serial measurements may suggest ACS but many other  chronic and acute conditions are known to elevate hsTnI results.  Refer to the "Links" section for chest pain algorithms and additional  guidance. Performed at [facility],  [address] [place], [place] [address]   Urinalysis, Routine w reflex microscopic -Urine, Clean Catch     Status: Abnormal   Collection Time: 11/15/22  7:56 PM  Result Value Ref Range   Color, Urine STRAW (A) YELLOW   APPearance CLEAR (A) CLEAR   Specific Gravity, Urine 1.009 1.005 - 1.030   pH 7.0 5.0 - 8.0   Glucose, UA NEGATIVE NEGATIVE mg/dL   Hgb urine dipstick NEGATIVE NEGATIVE   Bilirubin Urine NEGATIVE NEGATIVE   Ketones, ur NEGATIVE NEGATIVE mg/dL   Protein, ur NEGATIVE NEGATIVE mg/dL   Nitrite NEGATIVE NEGATIVE   Leukocytes,Ua SMALL (A) NEGATIVE   RBC / HPF 0-5 0 - 5 RBC/hpf   WBC, UA 0-5 0 - 5 WBC/hpf   Bacteria, UA RARE (A) NONE SEEN   Squamous Epithelial / HPF 0-5 0 - 5 /HPF   Mucus PRESENT     Comment: Performed at [facility], [address] [place], [place] [address]  Hepatic function panel     Status: None   Collection Time: 11/15/22  7:56 PM  Result Value Ref Range   Total Protein 7.4 6.5 - 8.1 g/dL   Albumin 4.4 3.5 - 5.0 g/dL   AST 29 15 - 41 U/L   ALT 33 0 - 44 U/L   Alkaline Phosphatase 78 38 - 126 U/L   Total Bilirubin 0.4 0.3 - 1.2 mg/dL   Bilirubin, Direct <8.2 0.0 - 0.2 mg/dL   Indirect Bilirubin NOT CALCULATED 0.3 - 0.9 mg/dL    Comment: Performed at [facility], [address] [place], [place] [address]  Lipase, blood     Status: None   Collection Time: 11/15/22  7:56 PM  Result Value Ref Range   Lipase 30 11 - 51 U/L    Comment: Performed at [facility], [address] [place], [place] [address]  Troponin I (High Sensitivity)     Status: None   Collection Time: 11/15/22 11:27 PM  Result Value Ref Range   Troponin I (High Sensitivity) 2 <18 ng/L    Comment: (NOTE) Elevated high sensitivity troponin I (hsTnI) values and significant  changes across serial measurements may suggest ACS but many other  chronic and acute conditions are known to elevate hsTnI results.  Refer to the "Links" section for  chest pain algorithms and additional  guidance. Performed at [facility], [address] [place], [place] [address]   ANA 12 Profile, Do All RDL     Status: Abnormal   Collection Time: 11/22/22 10:22 AM  Result Value Ref Range   Anti-Nuclear Ab by IFA (RDL) Positive (A) Negative   Anti-Centromere Ab (RDL) <1:40 <1:40   Anti-dsDNA Ab by Farr(RDL) <8.0 <8.0 IU/mL   Anti-Sm Ab (RDL) <20 <20 Units   Anti-U1 RNP Ab (RDL) <20 <20 Units   Anti-Ro (SS-A) Ab (RDL) <20 <20 Units   Anti-La (SS-B) Ab (RDL) <20 <20 Units   Anti-Scl-70 Ab (RDL) <20 <20 Units   Anti-Cardiolipin Ab, IgG (RDL) <15 <15 GPL U/mL   Anti-Cardiolipin Ab, IgA (RDL) <12 <12 APL U/mL   Anti-Cardiolipin Ab, IgM (RDL) <13 <13 MPL U/mL   C3 Complement (RDL) 160 82 - 167 mg/dL   C4 Complement (RDL) 53 (H) 14 - 44 mg/dL   Anti-TPO Ab (RDL) <6.2 <9.0 IU/mL    Comment:    Interpretation for Anti-Sm, Anti-U1 RNP, Anti-Ro,     Anti-La:         Negative:                                <  20         Weak Positive:                       20 - 39         Moderate Positive:                   40 - 80         Strong Positive:                         >80         Strong Positive:                         >59    Interpretation for Anti-Cardiolipin Ab:     Negative:               GPL <15, APL <12, MPL <13     Indeterminate:    GPL 15-20, APL 12-20, MPL 13-20     Low Positive:           GPL, APL, MPL    >20 - 40     Med Positive:           GPL, APL, MPL    >40 - 80     High Positive:          GPL, APL, MPL         >80 SLE classification criteria are based on Med to High titer (>40) Anti-Cardiolipin Ab (aCL). aCL may be elevated transiently with certain infections and may increase spuriously in the presence of rheumatoid factor.   Lipid panel     Status: Abnormal   Collection Time: 11/22/22 10:22 AM  Result Value Ref Range   Cholesterol, Total 190 100 - 199 mg/dL   Triglycerides 295 0 - 149 mg/dL   HDL 54 >62 mg/dL   VLDL  Cholesterol Cal 23 5 - 40 mg/dL   LDL Chol Calc (NIH) 130 (H) 0 - 99 mg/dL   Chol/HDL Ratio 3.5 0.0 - 4.4 ratio    Comment:                                   T. Chol/HDL Ratio                                             Men  Women                               1/2 Avg.Risk  3.4    3.3                                   Avg.Risk  5.0    4.4                                2X Avg.Risk  9.6    7.1  3X Avg.Risk 23.4   11.0   VITAMIN D 25 Hydroxy (Vit-D Deficiency, Fractures)     Status: Abnormal   Collection Time: 11/22/22 10:22 AM  Result Value Ref Range   Vit D, 25-Hydroxy 27.6 (L) 30.0 - 100.0 ng/mL    Comment: Vitamin D deficiency has been defined by the Institute of Medicine and an Endocrine Society practice guideline as a level of serum 25-OH vitamin D less than 20 ng/mL (1,2). The Endocrine Society went on to further define vitamin D insufficiency as a level between 21 and 29 ng/mL (2). 1. IOM (Institute of Medicine). 2010. Dietary reference    intakes for calcium and D. [place]: The    [employer]. 2. Holick MF, Binkley , Bischoff-Ferrari HA, et al.    Evaluation, treatment, and prevention of vitamin D    deficiency: an Endocrine Society clinical practice    guideline. JCEM. 2011 Jul; 96(7):1911-30.   CBC With Differential     Status: None   Collection Time: 11/22/22 10:22 AM  Result Value Ref Range   WBC 8.6 3.4 - 10.8 x10E3/uL   RBC 4.41 3.77 - 5.28 x10E6/uL   Hemoglobin 13.4 11.1 - 15.9 g/dL   Hematocrit 21.3 08.6 - 46.6 %   MCV 92 79 - 97 fL   MCH 30.4 26.6 - 33.0 pg   MCHC 33.2 31.5 - 35.7 g/dL   RDW 57.8 46.9 - 62.9 %   Neutrophils 64 Not Estab. %   Lymphs 23 Not Estab. %   Monocytes 9 Not Estab. %   Eos 3 Not Estab. %   Basos 1 Not Estab. %   Neutrophils Absolute 5.5 1.4 - 7.0 x10E3/uL   Lymphocytes Absolute 2.0 0.7 - 3.1 x10E3/uL   Monocytes Absolute 0.7 0.1 - 0.9 x10E3/uL   EOS (ABSOLUTE) 0.3 0.0 - 0.4  x10E3/uL   Basophils Absolute 0.1 0.0 - 0.2 x10E3/uL   Immature Granulocytes 0 Not Estab. %   Immature Grans (Abs) 0.0 0.0 - 0.1 x10E3/uL    Comment: **Effective January 07, 2023, profile [id number] CBC/Differential**   (No Platelet) will be made non-orderable. [facility] Offers:   [id number] CBC With Differential/Platelet   CMP14+EGFR     Status: None   Collection Time: 11/22/22 10:22 AM  Result Value Ref Range   Glucose 83 70 - 99 mg/dL   BUN 11 8 - 27 mg/dL   Creatinine, Ser 2.44 0.57 - 1.00 mg/dL   eGFR 010 >27 OZ/DGU/4.40   BUN/Creatinine Ratio 17 12 - 28   Sodium 143 134 - 144 mmol/L   Potassium 4.2 3.5 - 5.2 mmol/L   Chloride 102 96 - 106 mmol/L   CO2 25 20 - 29 mmol/L   Calcium 9.4 8.7 - 10.3 mg/dL   Total Protein 6.9 6.0 - 8.5 g/dL   Albumin 4.7 3.8 - 4.9 g/dL   Globulin, Total 2.2 1.5 - 4.5 g/dL   Albumin/Globulin Ratio 2.1    Bilirubin Total <0.2 0.0 - 1.2 mg/dL   Alkaline Phosphatase 101 44 - 121 IU/L   AST 22 0 - 40 IU/L   ALT 28 0 - 32 IU/L  TSH     Status: None   Collection Time: 11/22/22 10:22 AM  Result Value Ref Range   TSH 1.140 0.450 - 4.500 uIU/mL  Hemoglobin A1c     Status: Abnormal   Collection Time: 11/22/22 10:22 AM  Result Value Ref Range   Hgb A1c MFr Bld 5.8 (H) 4.8 - 5.6 %  Comment:          Prediabetes: 5.7 - 6.4          Diabetes: >6.4          Glycemic control for adults with diabetes: <7.0    Est. average glucose Bld gHb Est-mCnc 120 mg/dL  Vitamin Z61     Status: None   Collection Time: 11/22/22 10:22 AM  Result Value Ref Range   Vitamin B-12 CANCELED pg/mL    Comment: Test not performed. Deterioration occurred during specimen handling. [facility] is providing the patient with re-collection instructions.  Result canceled by the ancillary.   HSV 1 and 2 Ab, IgG     Status: Abnormal   Collection Time: 11/22/22 10:22 AM  Result Value Ref Range   HSV 1 Glycoprotein G Ab, IgG 5.28 (H) 0.00 - 0.90 index    Comment:                                   Negative        <0.91                                  Equivocal 0.91 - 1.09                                  Positive        >1.09  Note: Negative indicates no antibodies detected to  HSV-1. Equivocal may suggest early infection.  If  clinically appropriate, retest at later date. Positive  indicates antibodies detected to HSV-1.    HSV 2 IgG, Type Spec <0.91 0.00 - 0.90 index    Comment:                                  Negative        <0.91                                  Equivocal 0.91 - 1.09                                  Positive        >1.09  HSV-2 Antibody Interpretation: Current guidelines and  recommendations do not recommend routine screening  for HSV-2 in asymptomatic individuals, including  those that are pregnant. A negative antibody result  indicates no detectable antibodies to HSV-2 were  found. If recent exposure is suspected, retest in 4  to 6 weeks. Equivocal samples should be retested in  4 to 6 weeks. A positive result indicates the  presence of detectable IgG antibody to HSV-2.  FALSE POSITIVE RESULTS MAY OCCUR. Repeat testing, or  testing by a different method, may be indicated in  some settings (e.g. patients with low likelihood of  HSV infection). If clinically appropriate, retest 4  to 6 weeks later. HSV-2 IgG antibody testing results  should be clinically correlated.   RPR w/reflex to TrepSure     Status: None   Collection Time: 11/22/22 10:22 AM  Result Value Ref Range   RPR Non Reactive Non Reactive  Acute  Viral Hepatitis (HAV, HBV, HCV)     Status: None   Collection Time: 11/22/22 10:22 AM  Result Value Ref Range   Hep A IgM Negative Negative   Hepatitis B Surface Ag Negative Negative   Hep B C IgM Negative Negative   HCV Ab Non Reactive Non Reactive  HIV Antibody (routine testing w rflx)     Status: None   Collection Time: 11/22/22 10:22 AM  Result Value Ref Range   HIV Screen 4th Generation wRfx Non Reactive Non Reactive    Comment:  HIV-1/HIV-2 antibodies and HIV-1 p24 antigen were NOT detected. There is no laboratory evidence of HIV infection. HIV Negative   ANA Titer and Pattern     Status: Abnormal   Collection Time: 11/22/22 10:22 AM  Result Value Ref Range   Speckled Pattern 1:80 (H) <1:40   Spindle Apparatus Pattern 1:80 (H) <1:40   Note: Comment     Comment: ANA performed by Indirect Fluorescent Antibody (IFA)  Interpretation:     Status: None   Collection Time: 11/22/22 10:22 AM  Result Value Ref Range   HCV Interp 1: Comment     Comment: Not infected with HCV unless early or acute infection is suspected (which may be delayed in an immunocompromised individual), or other evidence exists to indicate HCV infection.   Treponemal Antibodies, TPPA     Status: Abnormal   Collection Time: 11/22/22 10:22 AM  Result Value Ref Range   Treponemal Antibodies, TPPA Reactive (A) Non Reactive   Interpretation: Comment     Comment: Syphilis: Treponemal Antibodies with Reflex to RPR and RPR           Titer, Reverse Screening and Diagnosis Algorithm ------------------------------------------------------------ Treponemal              Treponemal     Ab       RPR, Qn     Ab, TPPA    Final Interpretation ----------   --------   ----------   ----------------------- Non          N/A        N/A          No laboratory evidence Reactive                             of syphilis. Retest in                                      2-4 weeks if recent                                      exposure is suspected. ----------   --------   ----------   ----------------------- Reactive     Non        Non          Treponemal antibodies              Reactive   Reactive     not confirmed.                                      Inconclusive for  syphilis; potential                                      early syphilis,                                      possible false                                        positive. Retest in 2-4                                      weeks if recent                                      exposure is suspected. ----------   --------   ----------   ----------------------- Reactive     Non        Reactive     Treponemal antibodies              Reactive                detected. Consistent                                      with past or current                                      (potential early)                                      syphilis. ----------   --------   ----------   ----------------------- Reactive     >/=1:1     N/A          Treponemal and                                      nontreponemal                                      antibodies detected.                                      Consistent with current                                      or past syphilis. This test is intended ONLY for specimens that have tested positive (reactive) or equivocal for Treponema pallidum antibodies prior to submission for testing.  For the full CDC-recommended syphilis  screening and diagnosis algorithm, [facility] offers test code 012005 RPR, Rfx Qn RPR/Confirm TP or 409811 T pallidum Screening Cascade.   POC CREATINE & ALBUMIN,URINE     Status: None   Collection Time: 11/22/22 11:31 AM  Result Value Ref Range   Microalbumin Ur, POC 10 mg/L   Creatinine, POC 50 mg/dL   Albumin/Creatinine Ratio, Urine, POC 30-300   POCT urinalysis dipstick     Status: None   Collection Time: 11/22/22 11:31 AM  Result Value Ref Range   Color, UA yellow    Clarity, UA clear    Glucose, UA Negative Negative   Bilirubin, UA neg    Ketones, UA neg    Spec Grav, UA 1.020 1.010 - 1.025   Blood, UA neg    pH, UA 7.5 5.0 - 8.0   Protein, UA Negative Negative   Urobilinogen, UA 0.2 0.2 or 1.0 E.U./dL   Nitrite, UA neg    Leukocytes, UA Negative Negative   Appearance clear    Odor none        Assessment & Plan:   Problem List Items Addressed This Visit       Active  Problems   Peripheral vascular disease (HCC)   Relevant Medications   [facility] ASPIRIN LOW DOSE 81 MG tablet   atorvastatin (LIPITOR) 40 MG tablet   Cervical radiculopathy - Primary   Relevant Medications   amitriptyline (ELAVIL) 10 MG tablet   gabapentin (NEURONTIN) 100 MG capsule   Other Relevant Orders   Ambulatory referral to Neurosurgery   Ambulatory referral to Physical Therapy    Return in about 2 weeks (around 12/24/2022).   Total time spent: 30 minutes  Miki Kins, FNP  12/10/2022   This document may have been prepared by [facility] Voice Recognition software and as such may include unintentional dictation errors.

## 2022-12-14 NOTE — Addendum Note (Signed)
Addended by: Grayling Congress on: 12/14/2022 06:31 PM   Modules accepted: Level of Service

## 2022-12-19 ENCOUNTER — Inpatient Hospital Stay
Admission: RE | Admit: 2022-12-19 | Discharge: 2022-12-19 | Disposition: A | Discharge status: Home / Self Care | Payer: Self-pay | Source: Ambulatory Visit | Attending: Neurosurgery | Admitting: Neurosurgery

## 2022-12-19 ENCOUNTER — Other Ambulatory Visit: Payer: Self-pay

## 2022-12-19 DIAGNOSIS — Z049 Encounter for examination and observation for unspecified reason: Secondary | ICD-10-CM

## 2022-12-19 DIAGNOSIS — M5030 Other cervical disc degeneration, unspecified cervical region: Secondary | ICD-10-CM | POA: Insufficient documentation

## 2022-12-20 DIAGNOSIS — G8929 Other chronic pain: Secondary | ICD-10-CM | POA: Insufficient documentation

## 2022-12-20 DIAGNOSIS — M5412 Radiculopathy, cervical region: Secondary | ICD-10-CM | POA: Diagnosis not present

## 2022-12-20 DIAGNOSIS — M5030 Other cervical disc degeneration, unspecified cervical region: Secondary | ICD-10-CM | POA: Diagnosis not present

## 2022-12-20 DIAGNOSIS — M4802 Spinal stenosis, cervical region: Secondary | ICD-10-CM | POA: Diagnosis not present

## 2022-12-20 DIAGNOSIS — M4316 Spondylolisthesis, lumbar region: Secondary | ICD-10-CM | POA: Diagnosis not present

## 2022-12-20 DIAGNOSIS — M5442 Lumbago with sciatica, left side: Secondary | ICD-10-CM | POA: Diagnosis not present

## 2022-12-24 ENCOUNTER — Ambulatory Visit: Payer: 59 | Admitting: Family

## 2022-12-24 DIAGNOSIS — G43009 Migraine without aura, not intractable, without status migrainosus: Secondary | ICD-10-CM | POA: Diagnosis not present

## 2022-12-24 DIAGNOSIS — G459 Transient cerebral ischemic attack, unspecified: Secondary | ICD-10-CM | POA: Diagnosis not present

## 2022-12-24 DIAGNOSIS — M47812 Spondylosis without myelopathy or radiculopathy, cervical region: Secondary | ICD-10-CM | POA: Diagnosis not present

## 2022-12-25 NOTE — Progress Notes (Addendum)
Referring Physician:  Miki Kins, FNP [address] [place],  [place] [address]  Primary Physician:  Samantha Kins, FNP  History of Present Illness: 12/26/2022 Ms. Samantha Banks is here today with a chief complaint of neck pain starting on the right side of the head down the right arm with radiation down the back to the left leg with right upper back and right shoulder, arm, hand and all fingers on the right with numbness/tingling. Entire arm has numbness intermittently. When she has the burnign the entire arm hurts as well.  She has trouble with her grip and dropping things.  Also has trouble with ambulation.  Has a recent diagnosis of a transient ischemic attack and has been placed on antiplatelet therapy.  She feels that her neck feels stiff.  She has not had any conservative therapy or epidural steroid injections.  She is currently on gabapentin and anti-inflammatories.  She is currently following with neurology, she is here for a second opinion, since she saw a spine surgery group earlier this month.  Conservative measures:  Physical therapy: No, home exercises but no formal therapy Multimodal medical therapy including regular antiinflammatories:  gabapentin, tylenol, ibuprofen  Injections:  no epidural steroid injections  Past Surgery: denies  I have utilized the care everywhere function in epic to review the outside records available from external health systems.  Review of Systems:  A 10 point review of systems is negative, except for the pertinent positives and negatives detailed in the HPI.  Past Medical History: Past Medical History:  Diagnosis Date   Allergic rhinitis    Arthritis    Atypical chest pain 01/23/2019   Dyspnea 09/20/2014   Headache    chronic migraines    No pertinent past medical history     Past Surgical History: Past Surgical History:  Procedure Laterality Date   CESAREAN SECTION  1986   CESAREAN SECTION  1990   CESAREAN SECTION   1991   COLONOSCOPY WITH PROPOFOL N/A 12/17/2016   Procedure: COLONOSCOPY WITH PROPOFOL;  Surgeon: Christena Deem, MD;  Location: [facility] ENDOSCOPY;  Service: Endoscopy;  Laterality: N/A;    Allergies: Allergies as of 12/26/2022 - Review Complete 12/10/2022  Allergen Reaction Noted   Iodinated contrast media Hives 12/14/2015   Latex Rash and Other (See Comments) 02/02/2012    Medications:  Current Outpatient Medications:    amitriptyline (ELAVIL) 10 MG tablet, Take 10 mg by mouth at bedtime., Disp: , Rfl:    amoxicillin-clavulanate (AUGMENTIN) 875-125 MG tablet, Take 1 tablet by mouth every 12 (twelve) hours. (Patient not taking: Reported on 11/22/2022), Disp: 14 tablet, Rfl: 0   atorvastatin (LIPITOR) 40 MG tablet, Take 40 mg by mouth at bedtime., Disp: , Rfl:    benzonatate (TESSALON PERLES) 100 MG capsule, Take 1 capsule (100 mg total) by mouth every 6 (six) hours as needed for cough. (Patient not taking: Reported on 11/22/2022), Disp: 30 capsule, Rfl: 0   clopidogrel (PLAVIX) 75 MG tablet, Take 75 mg by mouth daily., Disp: , Rfl:    [facility] ASPIRIN LOW DOSE 81 MG tablet, Take 81 mg by mouth daily., Disp: , Rfl:    gabapentin (NEURONTIN) 100 MG capsule, Take 100 mg by mouth 3 (three) times daily., Disp: , Rfl:    ibuprofen (ADVIL) 800 MG tablet, Take 1 tablet (800 mg total) by mouth every 8 (eight) hours as needed (pain). (Patient not taking: Reported on 11/22/2022), Disp: 21 tablet, Rfl: 0   omeprazole (PRILOSEC) 40  MG capsule, Take 1 capsule (40 mg total) by mouth daily., Disp: 30 capsule, Rfl: 1   Ubrogepant (UBRELVY) 100 MG TABS, Take 1 tablet (100 mg total) by mouth as needed (May repeat in 2 hours as needed.)., Disp: 16 tablet, Rfl: 1  Social History: Social History   Tobacco Use   Smoking status: Never   Smokeless tobacco: Never  Vaping Use   Vaping status: Never Used  Substance Use Topics   Alcohol use: No    Alcohol/week: 0.0 standard drinks of alcohol   Drug use: No     Family Medical History: Family History  Problem Relation Age of Onset   Throat cancer Mother    Osteoarthritis Mother    Hypertension Father    Diabetes Mellitus II Sister    Asthma Sister    Diabetes Daughter    Diabetes Daughter    Ulcers Daughter    Other Neg Hx    Stomach cancer Neg Hx    Pancreatic cancer Neg Hx    Colon cancer Neg Hx     Physical Examination: There were no vitals filed for this visit.  General: Patient is in no apparent distress. Attention to examination is appropriate.  Neck:   Supple.  Full range of motion.  Respiratory: Patient is breathing without any difficulty.   NEUROLOGICAL:     Awake, alert, oriented to person, place, and time.  Speech is clear and fluent.   Cranial Nerves: Pupils equal round and reactive to light.  Facial tone is symmetric but does demonstrate masked facies.  Facial sensation is symmetric. Shoulder shrug is symmetric. Tongue protrusion is midline.    Strength: Side Biceps Triceps Deltoid Interossei Grip Wrist Ext. Wrist Flex.  R 5 5 5 5 5 5 5   L 5 5 5 5 5 5 5    Side Iliopsoas Quads Hamstring PF DF EHL  R 5 5 5 5 5 5   L 5 5 5 5 5 5    Reflexes are 3+ and symmetric at the biceps, triceps, brachioradialis, patella and achilles.  Hoffmann's is positive bilaterally, clonus is 2-3 beats bilaterally.  Bilateral upper and lower extremity sensation is intact to light touch on gross examination  Gait is wide-based  Imaging: I have personally reviewed her images, shows no evidence of significant central stenosis on the cervical spine.  There is some evidence of foraminal stenosis notably at 5 6 on the right.    I have personally reviewed the images and agree with the above interpretation.  Medical Decision Making/Assessment and Plan: Samantha Banks is a 62 year old woman with a history of neck stiffness, recent TIA being treated with antiplatelet therapy, and upper and lower extremity pain who is referred for a second  opinion on her cervical stenosis.  On physical exam she has full strength to confrontation, however she is diffusely hyperreflexic with positive Hoffmann sign and positive clonus.  MRI showed no evidence of central stenosis or T2's no change at the cervical spine.  Neural foraminal stenosis of cervical spine She does have right arm pain however she states that it is the entire arm that causes her pain and it is not stuck to any certain dermatomal distribution.  She also states that her entire arm and hand feel heavy intermittently.  On physical examination she has full strength with increased reflexes which would argue against a cervical radiculopathy.  At this point I do not feel that she would benefit from surgical treatment of the C5-6 foraminal stenosis.  Right  arm pain Right arm pain is in a nondermatomal distribution.  She also states that she gets right hemibody as well as right lower extremity pain which does not sound to be radicular in nature.  As above has increased reflexes so would argue against a cervical radiculopathy.  TIA (transient ischemic attack) Currently being managed by her neurology team with antiplatelet therapy.  She will continue to follow-up with them.  There is no clear compressive reason for her hyperreflexia, will defer to neurology for further workup of her pathologic reflexes.   Lovenia Kim MD/MSCR Neurosurgery

## 2022-12-26 ENCOUNTER — Encounter: Payer: Self-pay | Admitting: Neurosurgery

## 2022-12-26 ENCOUNTER — Ambulatory Visit (INDEPENDENT_AMBULATORY_CARE_PROVIDER_SITE_OTHER): Payer: 59 | Admitting: Neurosurgery

## 2022-12-26 VITALS — BP 146/94 | HR 79 | Ht 59.0 in | Wt 165.6 lb

## 2022-12-26 DIAGNOSIS — M4802 Spinal stenosis, cervical region: Secondary | ICD-10-CM

## 2022-12-26 DIAGNOSIS — M79601 Pain in right arm: Secondary | ICD-10-CM | POA: Diagnosis not present

## 2022-12-26 DIAGNOSIS — G459 Transient cerebral ischemic attack, unspecified: Secondary | ICD-10-CM

## 2022-12-27 ENCOUNTER — Ambulatory Visit (INDEPENDENT_AMBULATORY_CARE_PROVIDER_SITE_OTHER): Payer: 59 | Admitting: Family

## 2022-12-27 VITALS — BP 136/86 | HR 75 | Ht 59.0 in | Wt 165.6 lb

## 2022-12-27 DIAGNOSIS — M542 Cervicalgia: Secondary | ICD-10-CM | POA: Diagnosis not present

## 2022-12-27 DIAGNOSIS — R42 Dizziness and giddiness: Secondary | ICD-10-CM

## 2022-12-27 DIAGNOSIS — G43719 Chronic migraine without aura, intractable, without status migrainosus: Secondary | ICD-10-CM

## 2022-12-27 DIAGNOSIS — R9389 Abnormal findings on diagnostic imaging of other specified body structures: Secondary | ICD-10-CM | POA: Diagnosis not present

## 2022-12-27 DIAGNOSIS — R413 Other amnesia: Secondary | ICD-10-CM

## 2023-01-06 ENCOUNTER — Encounter: Payer: Self-pay | Admitting: Family

## 2023-01-06 NOTE — Progress Notes (Signed)
Established Patient Office Visit  Subjective:  Patient ID: Samantha Banks, female    DOB: 16-Jun-1960  Age: 62 y.o. MRN: [id number]  Chief Complaint  Patient presents with   Medication Refill    Patient is here needing refills  She has been to her follow ups with Neurosurgery and ortho.   She had those appointments and has come in for her follow up.  The Ortho and NS are in agreement that she does not have symptoms consistent with the pain in her arms coming from  her neck.   She is also having significant forgetfulness, dizziness, says that she has been losing her grip on things, and she says she feels "foggy".    No other concerns at this time.   Past Medical History:  Diagnosis Date   Allergic rhinitis    Arthritis    Atypical chest pain 01/23/2019   Dyspnea 09/20/2014   Headache    chronic migraines    No pertinent past medical history     Past Surgical History:  Procedure Laterality Date   CESAREAN SECTION  1986   CESAREAN SECTION  1990   CESAREAN SECTION  1991   COLONOSCOPY WITH PROPOFOL N/A 12/17/2016   Procedure: COLONOSCOPY WITH PROPOFOL;  Surgeon: Christena Deem, MD;  Location: [facility] ENDOSCOPY;  Service: Endoscopy;  Laterality: N/A;    Social History   Socioeconomic History   Marital status: Widowed    Spouse name: Not on file   Number of children: 3   Years of education: Not on file   Highest education level: Not on file  Occupational History   Occupation: cna  Tobacco Use   Smoking status: Never   Smokeless tobacco: Never  Vaping Use   Vaping status: Never Used  Substance and Sexual Activity   Alcohol use: No    Alcohol/week: 0.0 standard drinks of alcohol   Drug use: No   Sexual activity: Not Currently    Birth control/protection: None  Other Topics Concern   Not on file  Social History Narrative   Not on file   Social Determinants of Health   Financial Resource Strain: Not on file  Food Insecurity: Low Risk  (12/02/2022)    [facility]   Food vital sign    Within the past 12 months, you worried that your food would run out before you got money to buy more: Never true    Within the past 12 months, the food you bought just didn't last and you didn't have money to get more. : Never true  Transportation Needs: No Transportation Needs (12/02/2022)   [facility]   Transportation    In the past 12 months, has lack of reliable transportation kept you from medical appointments, meetings, work or from getting things needed for daily living? : No  Physical Activity: Not on file  Stress: Not on file  Social Connections: Not on file  Intimate Partner Violence: Not on file    Family History  Problem Relation Age of Onset   Throat cancer Mother    Osteoarthritis Mother    Hypertension Father    Diabetes Mellitus II Sister    Asthma Sister    Diabetes Daughter    Diabetes Daughter    Ulcers Daughter    Other Neg Hx    Stomach cancer Neg Hx    Pancreatic cancer Neg Hx    Colon cancer Neg Hx  Allergies  Allergen Reactions   Iodinated Contrast Media Hives    Pt states hives with previous injection of IV contrast, IV benadryl given 1 hour prior to injection   Latex Rash and Other (See Comments)    Review of Systems  Constitutional:  Positive for malaise/fatigue.  Musculoskeletal:  Positive for back pain, myalgias and neck pain.  Neurological:  Positive for dizziness, sensory change, weakness and headaches.       Memory loss  Psychiatric/Behavioral:  Positive for depression. The patient is nervous/anxious.   All other systems reviewed and are negative.      Objective:   BP 136/86   Pulse 75   Ht 4\' 11"  (1.499 m)   Wt 165 lb 9.6 oz (75.1 kg)   LMP 02/01/2012   SpO2 99%   BMI 33.45 kg/m   Vitals:   12/27/22 1508  BP: 136/86  Pulse: 75  Height: 4\' 11"  (1.499 m)  Weight: 165 lb 9.6 oz (75.1 kg)  SpO2: 99%  BMI (Calculated): 33.43     Physical Exam Vitals and nursing note reviewed.  Constitutional:      Appearance: Normal appearance. She is normal weight.  HENT:     Head: Normocephalic and atraumatic.  Eyes:     Extraocular Movements: Extraocular movements intact.     Conjunctiva/sclera: Conjunctivae normal.     Pupils: Pupils are equal, round, and reactive to light.  Cardiovascular:     Rate and Rhythm: Normal rate.  Pulmonary:     Effort: Pulmonary effort is normal.  Musculoskeletal:        General: Normal range of motion.  Neurological:     General: No focal deficit present.     Mental Status: She is alert and oriented to person, place, and time. Mental status is at baseline.     Sensory: No sensory deficit.     Motor: Weakness present.     Coordination: Coordination normal.     Gait: Gait normal.     Deep Tendon Reflexes: Reflexes normal.  Psychiatric:        Attention and Perception: Attention and perception normal.        Mood and Affect: Mood is depressed. Affect is flat.        Behavior: Behavior is withdrawn. Behavior is cooperative.        Cognition and Memory: Memory is impaired.        Judgment: Judgment normal.     Comments: Patient is having difficulty with word retrieval      No results found for any visits on 12/27/22.  Recent Results (from the past 2160 hour(s))  Basic metabolic panel     Status: Abnormal   Collection Time: 11/15/22  7:56 PM  Result Value Ref Range   Sodium 140 135 - 145 mmol/L   Potassium 3.6 3.5 - 5.1 mmol/L   Chloride 106 98 - 111 mmol/L   CO2 25 22 - 32 mmol/L   Glucose, Bld 89 70 - 99 mg/dL    Comment: Glucose reference range applies only to samples taken after fasting for at least 8 hours.   BUN 12 6 - 20 mg/dL   Creatinine, Ser 7.82 0.44 - 1.00 mg/dL   Calcium 8.8 (L) 8.9 - 10.3 mg/dL   GFR, Estimated >95 >62 mL/min    Comment: (NOTE) Calculated using the CKD-EPI Creatinine Equation (2021)    Anion gap 9 5 - 15    Comment: Performed at [facility], [address] [place], [place] [address]  CBC     Status: Abnormal   Collection Time: 11/15/22  7:56 PM  Result Value Ref Range   WBC 12.8 (H) 4.0 - 10.5 K/uL   RBC 4.86 3.87 - 5.11 MIL/uL   Hemoglobin 14.4 12.0 - 15.0 g/dL   HCT 60.4 54.0 - 98.1 %   MCV 93.8 80.0 - 100.0 fL   MCH 29.6 26.0 - 34.0 pg   MCHC 31.6 30.0 - 36.0 g/dL   RDW 19.1 47.8 - 29.5 %   Platelets 257 150 - 400 K/uL   nRBC 0.0 0.0 - 0.2 %    Comment: Performed at [facility], [address], [place], [place] [address]  Troponin I (High Sensitivity)     Status: None   Collection Time: 11/15/22  7:56 PM  Result Value Ref Range   Troponin I (High Sensitivity) <2 <18 ng/L    Comment: (NOTE) Elevated high sensitivity troponin I (hsTnI) values and significant  changes across serial measurements may suggest ACS but many other  chronic and acute conditions are known to elevate hsTnI results.  Refer to the "Links" section for chest pain algorithms and additional  guidance. Performed at [facility], [address] [place], [place] [address]   Urinalysis, Routine w reflex microscopic -Urine, Clean Catch     Status: Abnormal   Collection Time: 11/15/22  7:56 PM  Result Value Ref Range   Color, Urine STRAW (A) YELLOW   APPearance CLEAR (A) CLEAR   Specific Gravity, Urine 1.009 1.005 - 1.030   pH 7.0 5.0 - 8.0   Glucose, UA NEGATIVE NEGATIVE mg/dL   Hgb urine dipstick NEGATIVE NEGATIVE   Bilirubin Urine NEGATIVE NEGATIVE   Ketones, ur NEGATIVE NEGATIVE mg/dL   Protein, ur NEGATIVE NEGATIVE mg/dL   Nitrite NEGATIVE NEGATIVE   Leukocytes,Ua SMALL (A) NEGATIVE   RBC / HPF 0-5 0 - 5 RBC/hpf   WBC, UA 0-5 0 - 5 WBC/hpf   Bacteria, UA RARE (A) NONE SEEN   Squamous Epithelial / HPF 0-5 0 - 5 /HPF   Mucus PRESENT     Comment: Performed at [facility], [address] [place], [place] [address]  Hepatic function panel     Status: None   Collection Time: 11/15/22  7:56 PM   Result Value Ref Range   Total Protein 7.4 6.5 - 8.1 g/dL   Albumin 4.4 3.5 - 5.0 g/dL   AST 29 15 - 41 U/L   ALT 33 0 - 44 U/L   Alkaline Phosphatase 78 38 - 126 U/L   Total Bilirubin 0.4 0.3 - 1.2 mg/dL   Bilirubin, Direct <9.5 0.0 - 0.2 mg/dL   Indirect Bilirubin NOT CALCULATED 0.3 - 0.9 mg/dL    Comment: Performed at [facility], [address] [place], [place] [address]  Lipase, blood     Status: None   Collection Time: 11/15/22  7:56 PM  Result Value Ref Range   Lipase 30 11 - 51 U/L    Comment: Performed at [facility], [address] [place], [place] [address]  Troponin I (High Sensitivity)     Status: None   Collection Time: 11/15/22 11:27 PM  Result Value Ref Range   Troponin I (High Sensitivity) 2 <18 ng/L    Comment: (NOTE) Elevated high sensitivity troponin I (hsTnI) values and significant  changes across serial measurements may suggest ACS but many other  chronic and acute conditions are known to  elevate hsTnI results.  Refer to the "Links" section for chest pain algorithms and additional  guidance. Performed at [facility], [address] [place], [place] [address]   ANA 12 Profile, Do All RDL     Status: Abnormal   Collection Time: 11/22/22 10:22 AM  Result Value Ref Range   Anti-Nuclear Ab by IFA (RDL) Positive (A) Negative   Anti-Centromere Ab (RDL) <1:40 <1:40   Anti-dsDNA Ab by Farr(RDL) <8.0 <8.0 IU/mL   Anti-Sm Ab (RDL) <20 <20 Units   Anti-U1 RNP Ab (RDL) <20 <20 Units   Anti-Ro (SS-A) Ab (RDL) <20 <20 Units   Anti-La (SS-B) Ab (RDL) <20 <20 Units   Anti-Scl-70 Ab (RDL) <20 <20 Units   Anti-Cardiolipin Ab, IgG (RDL) <15 <15 GPL U/mL   Anti-Cardiolipin Ab, IgA (RDL) <12 <12 APL U/mL   Anti-Cardiolipin Ab, IgM (RDL) <13 <13 MPL U/mL   C3 Complement (RDL) 160 82 - 167 mg/dL   C4 Complement (RDL) 53 (H) 14 - 44 mg/dL   Anti-TPO Ab (RDL) <8.2 <9.0 IU/mL    Comment:    Interpretation for Anti-Sm, Anti-U1 RNP,  Anti-Ro,     Anti-La:         Negative:                                <20         Weak Positive:                       20 - 39         Moderate Positive:                   40 - 80         Strong Positive:                         >80         Strong Positive:                         >59    Interpretation for Anti-Cardiolipin Ab:     Negative:               GPL <15, APL <12, MPL <13     Indeterminate:    GPL 15-20, APL 12-20, MPL 13-20     Low Positive:           GPL, APL, MPL    >20 - 40     Med Positive:           GPL, APL, MPL    >40 - 80     High Positive:          GPL, APL, MPL         >80 SLE classification criteria are based on Med to High titer (>40) Anti-Cardiolipin Ab (aCL). aCL may be elevated transiently with certain infections and may increase spuriously in the presence of rheumatoid factor.   Lipid panel     Status: Abnormal   Collection Time: 11/22/22 10:22 AM  Result Value Ref Range   Cholesterol, Total 190 100 - 199 mg/dL   Triglycerides 956 0 - 149 mg/dL   HDL 54 >21 mg/dL   VLDL Cholesterol Cal 23 5 - 40 mg/dL   LDL Chol Calc (NIH) 308 (H) 0 - 99  mg/dL   Chol/HDL Ratio 3.5 0.0 - 4.4 ratio    Comment:                                   T. Chol/HDL Ratio                                             Men  Women                               1/2 Avg.Risk  3.4    3.3                                   Avg.Risk  5.0    4.4                                2X Avg.Risk  9.6    7.1                                3X Avg.Risk 23.4   11.0   VITAMIN D 25 Hydroxy (Vit-D Deficiency, Fractures)     Status: Abnormal   Collection Time: 11/22/22 10:22 AM  Result Value Ref Range   Vit D, 25-Hydroxy 27.6 (L) 30.0 - 100.0 ng/mL    Comment: Vitamin D deficiency has been defined by the Institute of Medicine and an Endocrine Society practice guideline as a level of serum 25-OH vitamin D less than 20 ng/mL (1,2). The Endocrine Society went on to further define vitamin D insufficiency as a  level between 21 and 29 ng/mL (2). 1. IOM (Institute of Medicine). 2010. Dietary reference    intakes for calcium and D. [place]: The    [employer]. 2. Holick MF, [place], Bischoff-Ferrari HA, et al.    Evaluation, treatment, and prevention of vitamin D    deficiency: an Endocrine Society clinical practice    guideline. JCEM. 2011 Jul; 96(7):1911-30.   CBC With Differential     Status: None   Collection Time: 11/22/22 10:22 AM  Result Value Ref Range   WBC 8.6 3.4 - 10.8 x10E3/uL   RBC 4.41 3.77 - 5.28 x10E6/uL   Hemoglobin 13.4 11.1 - 15.9 g/dL   Hematocrit 16.1 09.6 - 46.6 %   MCV 92 79 - 97 fL   MCH 30.4 26.6 - 33.0 pg   MCHC 33.2 31.5 - 35.7 g/dL   RDW 04.5 40.9 - 81.1 %   Neutrophils 64 Not Estab. %   Lymphs 23 Not Estab. %   Monocytes 9 Not Estab. %   Eos 3 Not Estab. %   Basos 1 Not Estab. %   Neutrophils Absolute 5.5 1.4 - 7.0 x10E3/uL   Lymphocytes Absolute 2.0 0.7 - 3.1 x10E3/uL   Monocytes Absolute 0.7 0.1 - 0.9 x10E3/uL   EOS (ABSOLUTE) 0.3 0.0 - 0.4 x10E3/uL   Basophils Absolute 0.1 0.0 - 0.2 x10E3/uL   Immature Granulocytes 0 Not Estab. %   Immature Grans (Abs) 0.0 0.0 - 0.1 x10E3/uL    Comment: **Effective January 07, 2023,  profile [id number] CBC/Differential**   (No Platelet) will be made non-orderable. [facility] Offers:   [id number] CBC With Differential/Platelet   CMP14+EGFR     Status: None   Collection Time: 11/22/22 10:22 AM  Result Value Ref Range   Glucose 83 70 - 99 mg/dL   BUN 11 8 - 27 mg/dL   Creatinine, Ser 1.61 0.57 - 1.00 mg/dL   eGFR 096 >04 VW/UJW/1.19   BUN/Creatinine Ratio 17 12 - 28   Sodium 143 134 - 144 mmol/L   Potassium 4.2 3.5 - 5.2 mmol/L   Chloride 102 96 - 106 mmol/L   CO2 25 20 - 29 mmol/L   Calcium 9.4 8.7 - 10.3 mg/dL   Total Protein 6.9 6.0 - 8.5 g/dL   Albumin 4.7 3.8 - 4.9 g/dL   Globulin, Total 2.2 1.5 - 4.5 g/dL   Albumin/Globulin Ratio 2.1    Bilirubin Total <0.2 0.0 - 1.2 mg/dL   Alkaline Phosphatase  101 44 - 121 IU/L   AST 22 0 - 40 IU/L   ALT 28 0 - 32 IU/L  TSH     Status: None   Collection Time: 11/22/22 10:22 AM  Result Value Ref Range   TSH 1.140 0.450 - 4.500 uIU/mL  Hemoglobin A1c     Status: Abnormal   Collection Time: 11/22/22 10:22 AM  Result Value Ref Range   Hgb A1c MFr Bld 5.8 (H) 4.8 - 5.6 %    Comment:          Prediabetes: 5.7 - 6.4          Diabetes: >6.4          Glycemic control for adults with diabetes: <7.0    Est. average glucose Bld gHb Est-mCnc 120 mg/dL  Vitamin J47     Status: None   Collection Time: 11/22/22 10:22 AM  Result Value Ref Range   Vitamin B-12 CANCELED pg/mL    Comment: Test not performed. Deterioration occurred during specimen handling. [facility] is providing the patient with re-collection instructions.  Result canceled by the ancillary.   HSV 1 and 2 Ab, IgG     Status: Abnormal   Collection Time: 11/22/22 10:22 AM  Result Value Ref Range   HSV 1 Glycoprotein G Ab, IgG 5.28 (H) 0.00 - 0.90 index    Comment:                                  Negative        <0.91                                  Equivocal 0.91 - 1.09                                  Positive        >1.09  Note: Negative indicates no antibodies detected to  HSV-1. Equivocal may suggest early infection.  If  clinically appropriate, retest at later date. Positive  indicates antibodies detected to HSV-1.    HSV 2 IgG, Type Spec <0.91 0.00 - 0.90 index    Comment:  Negative        <0.91                                  Equivocal 0.91 - 1.09                                  Positive        >1.09  HSV-2 Antibody Interpretation: Current guidelines and  recommendations do not recommend routine screening  for HSV-2 in asymptomatic individuals, including  those that are pregnant. A negative antibody result  indicates no detectable antibodies to HSV-2 were  found. If recent exposure is suspected, retest in 4  to 6 weeks. Equivocal samples  should be retested in  4 to 6 weeks. A positive result indicates the  presence of detectable IgG antibody to HSV-2.  FALSE POSITIVE RESULTS MAY OCCUR. Repeat testing, or  testing by a different method, may be indicated in  some settings (e.g. patients with low likelihood of  HSV infection). If clinically appropriate, retest 4  to 6 weeks later. HSV-2 IgG antibody testing results  should be clinically correlated.   RPR w/reflex to TrepSure     Status: None   Collection Time: 11/22/22 10:22 AM  Result Value Ref Range   RPR Non Reactive Non Reactive  Acute Viral Hepatitis (HAV, HBV, HCV)     Status: None   Collection Time: 11/22/22 10:22 AM  Result Value Ref Range   Hep A IgM Negative Negative   Hepatitis B Surface Ag Negative Negative   Hep B C IgM Negative Negative   HCV Ab Non Reactive Non Reactive  HIV Antibody (routine testing w rflx)     Status: None   Collection Time: 11/22/22 10:22 AM  Result Value Ref Range   HIV Screen 4th Generation wRfx Non Reactive Non Reactive    Comment: HIV-1/HIV-2 antibodies and HIV-1 p24 antigen were NOT detected. There is no laboratory evidence of HIV infection. HIV Negative   ANA Titer and Pattern     Status: Abnormal   Collection Time: 11/22/22 10:22 AM  Result Value Ref Range   Speckled Pattern 1:80 (H) <1:40   Spindle Apparatus Pattern 1:80 (H) <1:40   Note: Comment     Comment: ANA performed by Indirect Fluorescent Antibody (IFA)  Interpretation:     Status: None   Collection Time: 11/22/22 10:22 AM  Result Value Ref Range   HCV Interp 1: Comment     Comment: Not infected with HCV unless early or acute infection is suspected (which may be delayed in an immunocompromised individual), or other evidence exists to indicate HCV infection.   Treponemal Antibodies, TPPA     Status: Abnormal   Collection Time: 11/22/22 10:22 AM  Result Value Ref Range   Treponemal Antibodies, TPPA Reactive (A) Non Reactive   Interpretation: Comment      Comment: Syphilis: Treponemal Antibodies with Reflex to RPR and RPR           Titer, Reverse Screening and Diagnosis Algorithm ------------------------------------------------------------ Treponemal              Treponemal     Ab       RPR, Qn     Ab, TPPA    Final Interpretation ----------   --------   ----------   ----------------------- Non  N/A        N/A          No laboratory evidence Reactive                             of syphilis. Retest in                                      2-4 weeks if recent                                      exposure is suspected. ----------   --------   ----------   ----------------------- Reactive     Non        Non          Treponemal antibodies              Reactive   Reactive     not confirmed.                                      Inconclusive for                                      syphilis; potential                                      early syphilis,                                      possible false                                       positive. Retest in 2-4                                      weeks if recent                                      exposure is suspected. ----------   --------   ----------   ----------------------- Reactive     Non        Reactive     Treponemal antibodies              Reactive                detected. Consistent                                      with past or current                                      (  potential early)                                      syphilis. ----------   --------   ----------   ----------------------- Reactive     >/=1:1     N/A          Treponemal and                                      nontreponemal                                      antibodies detected.                                      Consistent with current                                      or past syphilis. This test is intended ONLY for specimens that have tested positive (reactive) or equivocal for  Treponema pallidum antibodies prior to submission for testing.  For the full CDC-recommended syphilis screening and diagnosis algorithm, [facility] offers test code 012005 RPR, Rfx Qn RPR/Confirm TP or 147829 T pallidum Screening Cascade.   POC CREATINE & ALBUMIN,URINE     Status: None   Collection Time: 11/22/22 11:31 AM  Result Value Ref Range   Microalbumin Ur, POC 10 mg/L   Creatinine, POC 50 mg/dL   Albumin/Creatinine Ratio, Urine, POC 30-300   POCT urinalysis dipstick     Status: None   Collection Time: 11/22/22 11:31 AM  Result Value Ref Range   Color, UA yellow    Clarity, UA clear    Glucose, UA Negative Negative   Bilirubin, UA neg    Ketones, UA neg    Spec Grav, UA 1.020 1.010 - 1.025   Blood, UA neg    pH, UA 7.5 5.0 - 8.0   Protein, UA Negative Negative   Urobilinogen, UA 0.2 0.2 or 1.0 E.U./dL   Nitrite, UA neg    Leukocytes, UA Negative Negative   Appearance clear    Odor none        Assessment & Plan:   Problem List Items Addressed This Visit       Active Problems   Memory change - Primary   Relevant Orders   Ambulatory referral to Vascular Surgery   Other Visit Diagnoses     Abnormal magnetic resonance angiography (MRA) of neck       Relevant Orders   Ambulatory referral to Vascular Surgery   Intractable chronic migraine without aura and without status migrainosus       Relevant Orders   Ambulatory referral to Vascular Surgery   Dizziness       Relevant Orders   Ambulatory referral to Vascular Surgery   Neck pain       Relevant Orders   Ambulatory referral to Vascular Surgery     After going back to review the MRA done while she was in the hospital, 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  I am referring patient to Vascular surgery to see if this could potentially explain her symptoms.   I will see her back in 1 month.   Return in about 1 month (around 01/27/2023).   Total time spent: 30 minutes  Miki Kins,  FNP  12/27/2022   This document may have been prepared by [facility] Voice Recognition software and as such may include unintentional dictation errors.

## 2023-01-10 ENCOUNTER — Telehealth: Payer: Self-pay | Admitting: Family

## 2023-01-10 NOTE — Telephone Encounter (Signed)
Samantha Banks left message with answering service stating she is waiting on a detailed letter for social security pertaining diagnosis, etc. Requesting a call back or can send email to [email or web address]

## 2023-01-11 ENCOUNTER — Other Ambulatory Visit (INDEPENDENT_AMBULATORY_CARE_PROVIDER_SITE_OTHER): Payer: Self-pay | Admitting: Nurse Practitioner

## 2023-01-11 DIAGNOSIS — R42 Dizziness and giddiness: Secondary | ICD-10-CM

## 2023-01-13 ENCOUNTER — Other Ambulatory Visit: Payer: Self-pay | Admitting: Family

## 2023-01-14 ENCOUNTER — Other Ambulatory Visit: Payer: Self-pay | Admitting: Family

## 2023-01-14 ENCOUNTER — Other Ambulatory Visit: Payer: Self-pay

## 2023-01-14 MED ORDER — GABAPENTIN 100 MG PO CAPS
100.0000 mg | ORAL_CAPSULE | Freq: Three times a day (TID) | ORAL | 3 refills | Status: DC
  Filled 2023-01-14: qty 90, 30d supply, fill #0
  Filled 2023-02-09: qty 90, 30d supply, fill #1
  Filled 2023-04-10 – 2023-04-22 (×2): qty 90, 30d supply, fill #2
  Filled 2023-05-17: qty 90, 30d supply, fill #3

## 2023-01-15 ENCOUNTER — Ambulatory Visit (INDEPENDENT_AMBULATORY_CARE_PROVIDER_SITE_OTHER): Payer: 59

## 2023-01-15 ENCOUNTER — Encounter (INDEPENDENT_AMBULATORY_CARE_PROVIDER_SITE_OTHER): Payer: Self-pay | Admitting: Vascular Surgery

## 2023-01-15 ENCOUNTER — Ambulatory Visit (INDEPENDENT_AMBULATORY_CARE_PROVIDER_SITE_OTHER): Payer: 59 | Admitting: Vascular Surgery

## 2023-01-15 VITALS — BP 128/87 | HR 76 | Resp 16 | Ht 59.0 in | Wt 166.0 lb

## 2023-01-15 DIAGNOSIS — G8929 Other chronic pain: Secondary | ICD-10-CM | POA: Diagnosis not present

## 2023-01-15 DIAGNOSIS — R42 Dizziness and giddiness: Secondary | ICD-10-CM | POA: Diagnosis not present

## 2023-01-15 DIAGNOSIS — R519 Headache, unspecified: Secondary | ICD-10-CM | POA: Diagnosis not present

## 2023-01-15 DIAGNOSIS — E785 Hyperlipidemia, unspecified: Secondary | ICD-10-CM | POA: Diagnosis not present

## 2023-01-15 NOTE — Progress Notes (Unsigned)
Patient ID: Samantha Banks, female   DOB: March 14, 1961, 62 y.o.   MRN: [id number]  No chief complaint on file.   HPI Samantha Banks is a 62 y.o. female.  I am asked to see the patient by *** for evaluation of ***.     Past Medical History:  Diagnosis Date   Allergic rhinitis    Arthritis    Atypical chest pain 01/23/2019   Dyspnea 09/20/2014   Headache    chronic migraines    No pertinent past medical history     Past Surgical History:  Procedure Laterality Date   CESAREAN SECTION  1986   CESAREAN SECTION  1990   CESAREAN SECTION  1991   COLONOSCOPY WITH PROPOFOL N/A 12/17/2016   Procedure: COLONOSCOPY WITH PROPOFOL;  Surgeon: Christena Deem, MD;  Location: [facility] ENDOSCOPY;  Service: Endoscopy;  Laterality: N/A;     Family History  Problem Relation Age of Onset   Throat cancer Mother    Osteoarthritis Mother    Hypertension Father    Diabetes Mellitus II Sister    Asthma Sister    Diabetes Daughter    Diabetes Daughter    Ulcers Daughter    Other Neg Hx    Stomach cancer Neg Hx    Pancreatic cancer Neg Hx    Colon cancer Neg Hx       Social History   Tobacco Use   Smoking status: Never   Smokeless tobacco: Never  Vaping Use   Vaping status: Never Used  Substance Use Topics   Alcohol use: No    Alcohol/week: 0.0 standard drinks of alcohol   Drug use: No     Allergies  Allergen Reactions   Iodinated Contrast Media Hives    Pt states hives with previous injection of IV contrast, IV benadryl given 1 hour prior to injection   Latex Rash and Other (See Comments)    Current Outpatient Medications  Medication Sig Dispense Refill   amitriptyline (ELAVIL) 10 MG tablet Take 10 mg by mouth at bedtime.     atorvastatin (LIPITOR) 40 MG tablet Take 40 mg by mouth at bedtime.     butalbital-acetaminophen-caffeine (FIORICET) 50-325-40 MG tablet Take 1 tablet by mouth every 4 (four) hours as needed.     clopidogrel (PLAVIX) 75 MG tablet Take 75 mg  by mouth daily.     [employer] ASPIRIN LOW DOSE 81 MG tablet Take 81 mg by mouth daily.     gabapentin (NEURONTIN) 100 MG capsule Take 1 capsule (100 mg total) by mouth 3 (three) times daily. 90 capsule 3   ibuprofen (ADVIL) 800 MG tablet Take 1 tablet (800 mg total) by mouth every 8 (eight) hours as needed (pain). 21 tablet 0   methocarbamol (ROBAXIN) 500 MG tablet Take 500 mg by mouth in the morning and at bedtime.     omeprazole (PRILOSEC) 40 MG capsule TAKE 1 CAPSULE (40 MG TOTAL) BY MOUTH DAILY. 30 capsule 1   Ubrogepant (UBRELVY) 100 MG TABS Take 1 tablet (100 mg total) by mouth as needed (May repeat in 2 hours as needed.). 16 tablet 1   No current facility-administered medications for this visit.      REVIEW OF SYSTEMS (Negative unless checked)   Constitutional: [] Weight loss  [] Fever  [] Chills Cardiac: [] Chest pain   [] Chest pressure   [] Palpitations   [] Shortness of breath when laying flat   [] Shortness of breath at rest   [] Shortness of breath with exertion. Vascular:  [  x]Pain in legs with walking   [x] Pain in legs at rest   [] Pain in legs when laying flat   [] Claudication   [] Pain in feet when walking  [] Pain in feet at rest  [] Pain in feet when laying flat   [] History of DVT   [] Phlebitis   [x] Swelling in legs   [] Varicose veins   [] Non-healing ulcers Pulmonary:   [] Uses home oxygen   [] Productive cough   [] Hemoptysis   [] Wheeze  [] COPD   [] Asthma Neurologic:  [] Dizziness  [] Blackouts   [] Seizures   [] History of stroke   [] History of TIA  [] Aphasia   [] Temporary blindness   [] Dysphagia   [] Weakness or numbness in arms   [] Weakness or numbness in legs Musculoskeletal:  [x] Arthritis   [] Joint swelling   [] Joint pain   [] Low back pain Hematologic:  [] Easy bruising  [] Easy bleeding   [] Hypercoagulable state   [] Anemic  [] Hepatitis  Gastrointestinal:  [] Blood in stool   [] Vomiting blood  [] Gastroesophageal reflux/heartburn   [] Difficulty swallowing   Genitourinary:  [] Chronic kidney disease    [] Difficult urination  [] Frequent urination  [] Burning with urination   [] Blood in urine Skin:  [] Rashes   [] Ulcers   [] Wounds Psychological:  [] History of anxiety   []  History of major depression.    Physical Exam LMP 02/01/2012  Gen:  WD/WN, NAD Head: [place]/AT, No temporalis wasting. Prominent temp pulse not noted. Ear/Nose/Throat: Hearing grossly intact, nares w/o erythema or drainage, oropharynx w/o Erythema/Exudate Eyes: Conjunctiva clear, sclera non-icteric  Neck: trachea midline.  No JVD.  Pulmonary:  Good air movement, respirations not labored, no use of accessory muscles  Cardiac: RRR, no JVD Vascular:  Vessel Right Left  Radial Palpable Palpable                                   Gastrointestinal:. No masses, surgical incisions, or scars. Musculoskeletal: M/S 5/5 throughout.  Extremities without ischemic changes.  No deformity or atrophy. *** edema. Neurologic: Sensation grossly intact in extremities.  Symmetrical.  Speech is fluent. Motor exam as listed above. Psychiatric: Judgment intact, Mood & affect appropriate for pt's clinical situation. Dermatologic: No rashes or ulcers noted.  No cellulitis or open wounds.    Radiology No results found.  Labs Recent Results (from the past 2160 hour(s))  Basic metabolic panel     Status: Abnormal   Collection Time: 11/15/22  7:56 PM  Result Value Ref Range   Sodium 140 135 - 145 mmol/L   Potassium 3.6 3.5 - 5.1 mmol/L   Chloride 106 98 - 111 mmol/L   CO2 25 22 - 32 mmol/L   Glucose, Bld 89 70 - 99 mg/dL    Comment: Glucose reference range applies only to samples taken after fasting for at least 8 hours.   BUN 12 6 - 20 mg/dL   Creatinine, Ser 1.02 0.44 - 1.00 mg/dL   Calcium 8.8 (L) 8.9 - 10.3 mg/dL   GFR, Estimated >72 >53 mL/min    Comment: (NOTE) Calculated using the CKD-EPI Creatinine Equation (2021)    Anion gap 9 5 - 15    Comment: Performed at [facility] [address] [place], [place]  [address]  CBC     Status: Abnormal   Collection Time: 11/15/22  7:56 PM  Result Value Ref Range   WBC 12.8 (H) 4.0 - 10.5 K/uL   RBC 4.86 3.87 - 5.11 MIL/uL  Hemoglobin 14.4 12.0 - 15.0 g/dL   HCT 16.1 09.6 - 04.5 %   MCV 93.8 80.0 - 100.0 fL   MCH 29.6 26.0 - 34.0 pg   MCHC 31.6 30.0 - 36.0 g/dL   RDW 40.9 81.1 - 91.4 %   Platelets 257 150 - 400 K/uL   nRBC 0.0 0.0 - 0.2 %    Comment: Performed at [facility], [address] [place], [place] [address]  Troponin I (High Sensitivity)     Status: None   Collection Time: 11/15/22  7:56 PM  Result Value Ref Range   Troponin I (High Sensitivity) <2 <18 ng/L    Comment: (NOTE) Elevated high sensitivity troponin I (hsTnI) values and significant  changes across serial measurements may suggest ACS but many other  chronic and acute conditions are known to elevate hsTnI results.  Refer to the "Links" section for chest pain algorithms and additional  guidance. Performed at [facility], [address] [place], [place] [address]   Urinalysis, Routine w reflex microscopic -Urine, Clean Catch     Status: Abnormal   Collection Time: 11/15/22  7:56 PM  Result Value Ref Range   Color, Urine STRAW (A) YELLOW   APPearance CLEAR (A) CLEAR   Specific Gravity, Urine 1.009 1.005 - 1.030   pH 7.0 5.0 - 8.0   Glucose, UA NEGATIVE NEGATIVE mg/dL   Hgb urine dipstick NEGATIVE NEGATIVE   Bilirubin Urine NEGATIVE NEGATIVE   Ketones, ur NEGATIVE NEGATIVE mg/dL   Protein, ur NEGATIVE NEGATIVE mg/dL   Nitrite NEGATIVE NEGATIVE   Leukocytes,Ua SMALL (A) NEGATIVE   RBC / HPF 0-5 0 - 5 RBC/hpf   WBC, UA 0-5 0 - 5 WBC/hpf   Bacteria, UA RARE (A) NONE SEEN   Squamous Epithelial / HPF 0-5 0 - 5 /HPF   Mucus PRESENT     Comment: Performed at [facility], [address] [place], [place] [address]  Hepatic function panel     Status: None   Collection Time: 11/15/22  7:56 PM  Result Value Ref Range   Total Protein 7.4 6.5 -  8.1 g/dL   Albumin 4.4 3.5 - 5.0 g/dL   AST 29 15 - 41 U/L   ALT 33 0 - 44 U/L   Alkaline Phosphatase 78 38 - 126 U/L   Total Bilirubin 0.4 0.3 - 1.2 mg/dL   Bilirubin, Direct <4.6 0.0 - 0.2 mg/dL   Indirect Bilirubin NOT CALCULATED 0.3 - 0.9 mg/dL    Comment: Performed at [facility], [address] [place], [place] [address]  Lipase, blood     Status: None   Collection Time: 11/15/22  7:56 PM  Result Value Ref Range   Lipase 30 11 - 51 U/L    Comment: Performed at [facility], [address] [place], [place] [address]  Troponin I (High Sensitivity)     Status: None   Collection Time: 11/15/22 11:27 PM  Result Value Ref Range   Troponin I (High Sensitivity) 2 <18 ng/L    Comment: (NOTE) Elevated high sensitivity troponin I (hsTnI) values and significant  changes across serial measurements may suggest ACS but many other  chronic and acute conditions are known to elevate hsTnI results.  Refer to the "Links" section for chest pain algorithms and additional  guidance. Performed at [facility], [address] [place], [place] [address]   ANA 12 Profile, Do All RDL     Status: Abnormal  Collection Time: 11/22/22 10:22 AM  Result Value Ref Range   Anti-Nuclear Ab by IFA (RDL) Positive (A) Negative   Anti-Centromere Ab (RDL) <1:40 <1:40   Anti-dsDNA Ab by Farr(RDL) <8.0 <8.0 IU/mL   Anti-Sm Ab (RDL) <20 <20 Units   Anti-U1 RNP Ab (RDL) <20 <20 Units   Anti-Ro (SS-A) Ab (RDL) <20 <20 Units   Anti-La (SS-B) Ab (RDL) <20 <20 Units   Anti-Scl-70 Ab (RDL) <20 <20 Units   Anti-Cardiolipin Ab, IgG (RDL) <15 <15 GPL U/mL   Anti-Cardiolipin Ab, IgA (RDL) <12 <12 APL U/mL   Anti-Cardiolipin Ab, IgM (RDL) <13 <13 MPL U/mL   C3 Complement (RDL) 160 82 - 167 mg/dL   C4 Complement (RDL) 53 (H) 14 - 44 mg/dL   Anti-TPO Ab (RDL) <1.6 <9.0 IU/mL    Comment:    Interpretation for Anti-Sm, Anti-U1 RNP, Anti-Ro,     Anti-La:         Negative:                                 <20         Weak Positive:                       20 - 39         Moderate Positive:                   40 - 80         Strong Positive:                         >80         Strong Positive:                         >59    Interpretation for Anti-Cardiolipin Ab:     Negative:               GPL <15, APL <12, MPL <13     Indeterminate:    GPL 15-20, APL 12-20, MPL 13-20     Low Positive:           GPL, APL, MPL    >20 - 40     Med Positive:           GPL, APL, MPL    >40 - 80     High Positive:          GPL, APL, MPL         >80 SLE classification criteria are based on Med to High titer (>40) Anti-Cardiolipin Ab (aCL). aCL may be elevated transiently with certain infections and may increase spuriously in the presence of rheumatoid factor.   Lipid panel     Status: Abnormal   Collection Time: 11/22/22 10:22 AM  Result Value Ref Range   Cholesterol, Total 190 100 - 199 mg/dL   Triglycerides 109 0 - 149 mg/dL   HDL 54 >60 mg/dL   VLDL Cholesterol Cal 23 5 - 40 mg/dL   LDL Chol Calc (NIH) 454 (H) 0 - 99 mg/dL   Chol/HDL Ratio 3.5 0.0 - 4.4 ratio    Comment:  T. Chol/HDL Ratio                                             Men  Women                               1/2 Avg.Risk  3.4    3.3                                   Avg.Risk  5.0    4.4                                2X Avg.Risk  9.6    7.1                                3X Avg.Risk 23.4   11.0   VITAMIN D 25 Hydroxy (Vit-D Deficiency, Fractures)     Status: Abnormal   Collection Time: 11/22/22 10:22 AM  Result Value Ref Range   Vit D, 25-Hydroxy 27.6 (L) 30.0 - 100.0 ng/mL    Comment: Vitamin D deficiency has been defined by the Institute of Medicine and an Endocrine Society practice guideline as a level of serum 25-OH vitamin D less than 20 ng/mL (1,2). The Endocrine Society went on to further define vitamin D insufficiency as a level between 21 and 29 ng/mL (2). 1. IOM (Institute of  Medicine). 2010. Dietary reference    intakes for calcium and D. [place]: The    [employer]. 2. Holick MF, Binkley Conover, Bischoff-Ferrari HA, et al.    Evaluation, treatment, and prevention of vitamin D    deficiency: an Endocrine Society clinical practice    guideline. JCEM. 2011 Jul; 96(7):1911-30.   CBC With Differential     Status: None   Collection Time: 11/22/22 10:22 AM  Result Value Ref Range   WBC 8.6 3.4 - 10.8 x10E3/uL   RBC 4.41 3.77 - 5.28 x10E6/uL   Hemoglobin 13.4 11.1 - 15.9 g/dL   Hematocrit 62.9 52.8 - 46.6 %   MCV 92 79 - 97 fL   MCH 30.4 26.6 - 33.0 pg   MCHC 33.2 31.5 - 35.7 g/dL   RDW 41.3 24.4 - 01.0 %   Neutrophils 64 Not Estab. %   Lymphs 23 Not Estab. %   Monocytes 9 Not Estab. %   Eos 3 Not Estab. %   Basos 1 Not Estab. %   Neutrophils Absolute 5.5 1.4 - 7.0 x10E3/uL   Lymphocytes Absolute 2.0 0.7 - 3.1 x10E3/uL   Monocytes Absolute 0.7 0.1 - 0.9 x10E3/uL   EOS (ABSOLUTE) 0.3 0.0 - 0.4 x10E3/uL   Basophils Absolute 0.1 0.0 - 0.2 x10E3/uL   Immature Granulocytes 0 Not Estab. %   Immature Grans (Abs) 0.0 0.0 - 0.1 x10E3/uL    Comment: **Effective January 07, 2023, profile [id number] CBC/Differential**   (No Platelet) will be made non-orderable. [facility] Offers:   [id number] CBC With Differential/Platelet   CMP14+EGFR     Status: None   Collection Time: 11/22/22 10:22 AM  Result Value Ref Range   Glucose 83 70 - 99 mg/dL  BUN 11 8 - 27 mg/dL   Creatinine, Ser 3.61 0.57 - 1.00 mg/dL   eGFR 443 >15 QM/GQQ/7.61   BUN/Creatinine Ratio 17 12 - 28   Sodium 143 134 - 144 mmol/L   Potassium 4.2 3.5 - 5.2 mmol/L   Chloride 102 96 - 106 mmol/L   CO2 25 20 - 29 mmol/L   Calcium 9.4 8.7 - 10.3 mg/dL   Total Protein 6.9 6.0 - 8.5 g/dL   Albumin 4.7 3.8 - 4.9 g/dL   Globulin, Total 2.2 1.5 - 4.5 g/dL   Albumin/Globulin Ratio 2.1    Bilirubin Total <0.2 0.0 - 1.2 mg/dL   Alkaline Phosphatase 101 44 - 121 IU/L   AST 22 0 - 40 IU/L   ALT 28 0 - 32  IU/L  TSH     Status: None   Collection Time: 11/22/22 10:22 AM  Result Value Ref Range   TSH 1.140 0.450 - 4.500 uIU/mL  Hemoglobin A1c     Status: Abnormal   Collection Time: 11/22/22 10:22 AM  Result Value Ref Range   Hgb A1c MFr Bld 5.8 (H) 4.8 - 5.6 %    Comment:          Prediabetes: 5.7 - 6.4          Diabetes: >6.4          Glycemic control for adults with diabetes: <7.0    Est. average glucose Bld gHb Est-mCnc 120 mg/dL  Vitamin P50     Status: None   Collection Time: 11/22/22 10:22 AM  Result Value Ref Range   Vitamin B-12 CANCELED pg/mL    Comment: Test not performed. Deterioration occurred during specimen handling. [facility] is providing the patient with re-collection instructions.  Result canceled by the ancillary.   HSV 1 and 2 Ab, IgG     Status: Abnormal   Collection Time: 11/22/22 10:22 AM  Result Value Ref Range   HSV 1 Glycoprotein G Ab, IgG 5.28 (H) 0.00 - 0.90 index    Comment:                                  Negative        <0.91                                  Equivocal 0.91 - 1.09                                  Positive        >1.09  Note: Negative indicates no antibodies detected to  HSV-1. Equivocal may suggest early infection.  If  clinically appropriate, retest at later date. Positive  indicates antibodies detected to HSV-1.    HSV 2 IgG, Type Spec <0.91 0.00 - 0.90 index    Comment:                                  Negative        <0.91                                  Equivocal 0.91 - 1.09  Positive        >1.09  HSV-2 Antibody Interpretation: Current guidelines and  recommendations do not recommend routine screening  for HSV-2 in asymptomatic individuals, including  those that are pregnant. A negative antibody result  indicates no detectable antibodies to HSV-2 were  found. If recent exposure is suspected, retest in 4  to 6 weeks. Equivocal samples should be retested in  4 to 6 weeks. A positive result  indicates the  presence of detectable IgG antibody to HSV-2.  FALSE POSITIVE RESULTS MAY OCCUR. Repeat testing, or  testing by a different method, may be indicated in  some settings (e.g. patients with low likelihood of  HSV infection). If clinically appropriate, retest 4  to 6 weeks later. HSV-2 IgG antibody testing results  should be clinically correlated.   RPR w/reflex to TrepSure     Status: None   Collection Time: 11/22/22 10:22 AM  Result Value Ref Range   RPR Non Reactive Non Reactive  Acute Viral Hepatitis (HAV, HBV, HCV)     Status: None   Collection Time: 11/22/22 10:22 AM  Result Value Ref Range   Hep A IgM Negative Negative   Hepatitis B Surface Ag Negative Negative   Hep B C IgM Negative Negative   HCV Ab Non Reactive Non Reactive  HIV Antibody (routine testing w rflx)     Status: None   Collection Time: 11/22/22 10:22 AM  Result Value Ref Range   HIV Screen 4th Generation wRfx Non Reactive Non Reactive    Comment: HIV-1/HIV-2 antibodies and HIV-1 p24 antigen were NOT detected. There is no laboratory evidence of HIV infection. HIV Negative   ANA Titer and Pattern     Status: Abnormal   Collection Time: 11/22/22 10:22 AM  Result Value Ref Range   Speckled Pattern 1:80 (H) <1:40   Spindle Apparatus Pattern 1:80 (H) <1:40   Note: Comment     Comment: ANA performed by Indirect Fluorescent Antibody (IFA)  Interpretation:     Status: None   Collection Time: 11/22/22 10:22 AM  Result Value Ref Range   HCV Interp 1: Comment     Comment: Not infected with HCV unless early or acute infection is suspected (which may be delayed in an immunocompromised individual), or other evidence exists to indicate HCV infection.   Treponemal Antibodies, TPPA     Status: Abnormal   Collection Time: 11/22/22 10:22 AM  Result Value Ref Range   Treponemal Antibodies, TPPA Reactive (A) Non Reactive   Interpretation: Comment     Comment: Syphilis: Treponemal Antibodies with Reflex to  RPR and RPR           Titer, Reverse Screening and Diagnosis Algorithm ------------------------------------------------------------ Treponemal              Treponemal     Ab       RPR, Qn     Ab, TPPA    Final Interpretation ----------   --------   ----------   ----------------------- Non          N/A        N/A          No laboratory evidence Reactive                             of syphilis. Retest in  2-4 weeks if recent                                      exposure is suspected. ----------   --------   ----------   ----------------------- Reactive     Non        Non          Treponemal antibodies              Reactive   Reactive     not confirmed.                                      Inconclusive for                                      syphilis; potential                                      early syphilis,                                      possible false                                       positive. Retest in 2-4                                      weeks if recent                                      exposure is suspected. ----------   --------   ----------   ----------------------- Reactive     Non        Reactive     Treponemal antibodies              Reactive                detected. Consistent                                      with past or current                                      (potential early)                                      syphilis. ----------   --------   ----------   ----------------------- Reactive     >/=1:1     N/A          Treponemal and  nontreponemal                                      antibodies detected.                                      Consistent with current                                      or past syphilis. This test is intended ONLY for specimens that have tested positive (reactive) or equivocal for Treponema pallidum antibodies prior to submission for  testing.  For the full CDC-recommended syphilis screening and diagnosis algorithm, [facility] offers test code 012005 RPR, Rfx Qn RPR/Confirm TP or 518841 T pallidum Screening Cascade.   POC CREATINE & ALBUMIN,URINE     Status: None   Collection Time: 11/22/22 11:31 AM  Result Value Ref Range   Microalbumin Ur, POC 10 mg/L   Creatinine, POC 50 mg/dL   Albumin/Creatinine Ratio, Urine, POC 30-300   POCT urinalysis dipstick     Status: None   Collection Time: 11/22/22 11:31 AM  Result Value Ref Range   Color, UA yellow    Clarity, UA clear    Glucose, UA Negative Negative   Bilirubin, UA neg    Ketones, UA neg    Spec Grav, UA 1.020 1.010 - 1.025   Blood, UA neg    pH, UA 7.5 5.0 - 8.0   Protein, UA Negative Negative   Urobilinogen, UA 0.2 0.2 or 1.0 E.U./dL   Nitrite, UA neg    Leukocytes, UA Negative Negative   Appearance clear    Odor none     Assessment/Plan:  No problem-specific Assessment & Plan notes found for this encounter.      Festus Barren 01/15/2023, 2:59 PM   This note was created with Dragon medical transcription system.  Any errors from dictation are unintentional.

## 2023-01-17 DIAGNOSIS — E785 Hyperlipidemia, unspecified: Secondary | ICD-10-CM | POA: Insufficient documentation

## 2023-01-17 DIAGNOSIS — R42 Dizziness and giddiness: Secondary | ICD-10-CM | POA: Insufficient documentation

## 2023-01-17 DIAGNOSIS — R519 Headache, unspecified: Secondary | ICD-10-CM | POA: Insufficient documentation

## 2023-01-17 NOTE — Assessment & Plan Note (Signed)
Her carotid duplex showed near normal carotid arteries with no elevated velocities and minimal plaque.  Both vertebral arteries are antegrade as well.  Carotid duplex today was unrevealing so I do not think her symptoms are related to any vascular disease.  I will defer further workup to her primary care provider or neurology.

## 2023-01-17 NOTE — Assessment & Plan Note (Signed)
Her carotid duplex showed near normal carotid arteries with no elevated velocities and minimal plaque.  Both vertebral arteries are antegrade as well. Not related to vascular disease.

## 2023-01-17 NOTE — Assessment & Plan Note (Signed)
lipid control important in reducing the progression of atherosclerotic disease. Continue statin therapy  

## 2023-01-28 ENCOUNTER — Ambulatory Visit (INDEPENDENT_AMBULATORY_CARE_PROVIDER_SITE_OTHER): Payer: 59 | Admitting: Family

## 2023-01-28 VITALS — BP 130/70 | HR 69 | Ht 59.0 in | Wt 154.8 lb

## 2023-01-28 DIAGNOSIS — Z7409 Other reduced mobility: Secondary | ICD-10-CM

## 2023-01-28 DIAGNOSIS — E538 Deficiency of other specified B group vitamins: Secondary | ICD-10-CM

## 2023-01-28 DIAGNOSIS — I739 Peripheral vascular disease, unspecified: Secondary | ICD-10-CM | POA: Diagnosis not present

## 2023-01-28 DIAGNOSIS — M79601 Pain in right arm: Secondary | ICD-10-CM

## 2023-01-28 DIAGNOSIS — R2689 Other abnormalities of gait and mobility: Secondary | ICD-10-CM | POA: Diagnosis not present

## 2023-01-28 DIAGNOSIS — Z789 Other specified health status: Secondary | ICD-10-CM | POA: Diagnosis not present

## 2023-01-28 DIAGNOSIS — R413 Other amnesia: Secondary | ICD-10-CM

## 2023-01-28 MED ORDER — CYANOCOBALAMIN 1000 MCG/ML IJ SOLN
1000.0000 ug | Freq: Once | INTRAMUSCULAR | Status: AC
Start: 2023-01-28 — End: 2023-01-28
  Administered 2023-01-28: 1000 ug via INTRAMUSCULAR

## 2023-02-09 ENCOUNTER — Other Ambulatory Visit: Payer: Self-pay | Admitting: Family

## 2023-02-12 ENCOUNTER — Other Ambulatory Visit: Payer: Self-pay

## 2023-02-12 MED ORDER — ATORVASTATIN CALCIUM 40 MG PO TABS
40.0000 mg | ORAL_TABLET | Freq: Every day | ORAL | 1 refills | Status: DC
  Filled 2023-02-12 – 2023-04-22 (×4): qty 30, 30d supply, fill #0
  Filled 2023-05-17: qty 30, 30d supply, fill #1
  Filled 2023-09-10 – 2023-09-24 (×2): qty 30, 30d supply, fill #2

## 2023-02-13 ENCOUNTER — Encounter: Payer: Self-pay | Admitting: Family

## 2023-02-13 NOTE — Assessment & Plan Note (Signed)
Patient stable.  Well controlled with current therapy.   Continue current meds.  

## 2023-02-13 NOTE — Assessment & Plan Note (Signed)
 Patient is seen by Vascular, who manage this condition.  She is well controlled with current therapy.   Will defer to them for further changes to plan of care.

## 2023-02-13 NOTE — Progress Notes (Signed)
Established Patient Office Visit  Subjective:  Patient ID: Samantha Banks, female    DOB: Nov 15, 1960  Age: 62 y.o. MRN: [id number]  Chief Complaint  Patient presents with   Follow-up    1 mo f/u    Patient is here today for her 1 month follow up.  She has been feeling fairly well since last appointment.   She does not have additional concerns to discuss today.  Labs are not due today. She needs refills.   I have reviewed her active problem list, medication list, allergies, notes from last encounter, lab results for her appointment today.      No other concerns at this time.   Past Medical History:  Diagnosis Date   Allergic rhinitis    Arthritis    Atypical chest pain 01/23/2019   Dyspnea 09/20/2014   Headache    chronic migraines    No pertinent past medical history     Past Surgical History:  Procedure Laterality Date   CESAREAN SECTION  1986   CESAREAN SECTION  1990   CESAREAN SECTION  1991   COLONOSCOPY WITH PROPOFOL N/A 12/17/2016   Procedure: COLONOSCOPY WITH PROPOFOL;  Surgeon: Christena Deem, MD;  Location: [facility] ENDOSCOPY;  Service: Endoscopy;  Laterality: N/A;    Social History   Socioeconomic History   Marital status: Widowed    Spouse name: Not on file   Number of children: 3   Years of education: Not on file   Highest education level: Not on file  Occupational History   Occupation: cna  Tobacco Use   Smoking status: Never   Smokeless tobacco: Never  Vaping Use   Vaping status: Never Used  Substance and Sexual Activity   Alcohol use: No    Alcohol/week: 0.0 standard drinks of alcohol   Drug use: No   Sexual activity: Not Currently    Birth control/protection: None  Other Topics Concern   Not on file  Social History Narrative   Not on file   Social Determinants of Health   Financial Resource Strain: Not on file  Food Insecurity: Low Risk  (12/02/2022)   [facility]   Hunger Vital Sign     Worried About Running Out of Food in the Last Year: Never true    Ran Out of Food in the Last Year: Never true  Transportation Needs: No Transportation Needs (12/02/2022)   [facility]   Transportation    In the past 12 months, has lack of reliable transportation kept you from medical appointments, meetings, work or from getting things needed for daily living? : No  Physical Activity: Not on file  Stress: Not on file  Social Connections: Not on file  Intimate Partner Violence: Not on file    Family History  Problem Relation Age of Onset   Throat cancer Mother    Osteoarthritis Mother    Hypertension Father    Diabetes Mellitus II Sister    Asthma Sister    Diabetes Daughter    Diabetes Daughter    Ulcers Daughter    Other Neg Hx    Stomach cancer Neg Hx    Pancreatic cancer Neg Hx    Colon cancer Neg Hx     Allergies  Allergen Reactions   Iodinated Contrast Media Hives    Pt states hives with previous injection of IV contrast, IV benadryl given 1 hour prior to injection   Latex Rash and Other (  See Comments)    Review of Systems  Constitutional:  Positive for malaise/fatigue.  Neurological:  Positive for weakness.  All other systems reviewed and are negative.      Objective:   BP 130/70   Pulse 69   Ht 4\' 11"  (1.499 m)   Wt 154 lb 12.8 oz (70.2 kg)   LMP 02/01/2012   SpO2 96%   BMI 31.27 kg/m   Vitals:   01/28/23 1520  BP: 130/70  Pulse: 69  Height: 4\' 11"  (1.499 m)  Weight: 154 lb 12.8 oz (70.2 kg)  SpO2: 96%  BMI (Calculated): 31.25    Physical Exam Vitals and nursing note reviewed.  Constitutional:      Appearance: Normal appearance. She is normal weight.  HENT:     Head: Normocephalic.  Eyes:     Extraocular Movements: Extraocular movements intact.     Conjunctiva/sclera: Conjunctivae normal.     Pupils: Pupils are equal, round, and reactive to light.  Cardiovascular:     Rate and Rhythm: Normal rate.   Pulmonary:     Effort: Pulmonary effort is normal.  Musculoskeletal:        General: Normal range of motion.     Cervical back: Normal range of motion.  Neurological:     General: No focal deficit present.     Mental Status: She is alert and oriented to person, place, and time. Mental status is at baseline.     Motor: Weakness present.  Psychiatric:        Mood and Affect: Mood normal.        Behavior: Behavior normal.        Thought Content: Thought content normal.        Judgment: Judgment normal.      No results found for any visits on 01/28/23.  Recent Results (from the past 2160 hour(s))  Basic metabolic panel     Status: Abnormal   Collection Time: 11/15/22  7:56 PM  Result Value Ref Range   Sodium 140 135 - 145 mmol/L   Potassium 3.6 3.5 - 5.1 mmol/L   Chloride 106 98 - 111 mmol/L   CO2 25 22 - 32 mmol/L   Glucose, Bld 89 70 - 99 mg/dL    Comment: Glucose reference range applies only to samples taken after fasting for at least 8 hours.   BUN 12 6 - 20 mg/dL   Creatinine, Ser 0.86 0.44 - 1.00 mg/dL   Calcium 8.8 (L) 8.9 - 10.3 mg/dL   GFR, Estimated >57 >84 mL/min    Comment: (NOTE) Calculated using the CKD-EPI Creatinine Equation (2021)    Anion gap 9 5 - 15    Comment: Performed at [facility], [address] [place], [place] [address]  CBC     Status: Abnormal   Collection Time: 11/15/22  7:56 PM  Result Value Ref Range   WBC 12.8 (H) 4.0 - 10.5 K/uL   RBC 4.86 3.87 - 5.11 MIL/uL   Hemoglobin 14.4 12.0 - 15.0 g/dL   HCT 52.8 41.3 - 24.4 %   MCV 93.8 80.0 - 100.0 fL   MCH 29.6 26.0 - 34.0 pg   MCHC 31.6 30.0 - 36.0 g/dL   RDW 01.0 27.2 - 53.6 %   Platelets 257 150 - 400 K/uL   nRBC 0.0 0.0 - 0.2 %    Comment: Performed at [facility], [address], [place], [place] [address]  Troponin I (High Sensitivity)  Status: None   Collection Time: 11/15/22  7:56 PM  Result Value Ref Range   Troponin I (High Sensitivity) <2 <18 ng/L     Comment: (NOTE) Elevated high sensitivity troponin I (hsTnI) values and significant  changes across serial measurements may suggest ACS but many other  chronic and acute conditions are known to elevate hsTnI results.  Refer to the "Links" section for chest pain algorithms and additional  guidance. Performed at [facility], [address] [place], [place] [address]   Urinalysis, Routine w reflex microscopic -Urine, Clean Catch     Status: Abnormal   Collection Time: 11/15/22  7:56 PM  Result Value Ref Range   Color, Urine STRAW (A) YELLOW   APPearance CLEAR (A) CLEAR   Specific Gravity, Urine 1.009 1.005 - 1.030   pH 7.0 5.0 - 8.0   Glucose, UA NEGATIVE NEGATIVE mg/dL   Hgb urine dipstick NEGATIVE NEGATIVE   Bilirubin Urine NEGATIVE NEGATIVE   Ketones, ur NEGATIVE NEGATIVE mg/dL   Protein, ur NEGATIVE NEGATIVE mg/dL   Nitrite NEGATIVE NEGATIVE   Leukocytes,Ua SMALL (A) NEGATIVE   RBC / HPF 0-5 0 - 5 RBC/hpf   WBC, UA 0-5 0 - 5 WBC/hpf   Bacteria, UA RARE (A) NONE SEEN   Squamous Epithelial / HPF 0-5 0 - 5 /HPF   Mucus PRESENT     Comment: Performed at [facility], [address] [place], [place] [address]  Hepatic function panel     Status: None   Collection Time: 11/15/22  7:56 PM  Result Value Ref Range   Total Protein 7.4 6.5 - 8.1 g/dL   Albumin 4.4 3.5 - 5.0 g/dL   AST 29 15 - 41 U/L   ALT 33 0 - 44 U/L   Alkaline Phosphatase 78 38 - 126 U/L   Total Bilirubin 0.4 0.3 - 1.2 mg/dL   Bilirubin, Direct <5.7 0.0 - 0.2 mg/dL   Indirect Bilirubin NOT CALCULATED 0.3 - 0.9 mg/dL    Comment: Performed at [facility], [address] [place], [place] [address]  Lipase, blood     Status: None   Collection Time: 11/15/22  7:56 PM  Result Value Ref Range   Lipase 30 11 - 51 U/L    Comment: Performed at [facility], [address] [place], [place] [address]  Troponin I (High Sensitivity)     Status: None   Collection Time:  11/15/22 11:27 PM  Result Value Ref Range   Troponin I (High Sensitivity) 2 <18 ng/L    Comment: (NOTE) Elevated high sensitivity troponin I (hsTnI) values and significant  changes across serial measurements may suggest ACS but many other  chronic and acute conditions are known to elevate hsTnI results.  Refer to the "Links" section for chest pain algorithms and additional  guidance. Performed at [facility], [address] [place], [place] [address]   ANA 12 Profile, Do All RDL     Status: Abnormal   Collection Time: 11/22/22 10:22 AM  Result Value Ref Range   Anti-Nuclear Ab by IFA (RDL) Positive (A) Negative   Anti-Centromere Ab (RDL) <1:40 <1:40   Anti-dsDNA Ab by Farr(RDL) <8.0 <8.0 IU/mL   Anti-Sm Ab (RDL) <20 <20 Units   Anti-U1 RNP Ab (RDL) <20 <20 Units   Anti-Ro (SS-A) Ab (RDL) <20 <20 Units   Anti-La (SS-B) Ab (RDL) <20 <20 Units   Anti-Scl-70 Ab (RDL) <20 <20 Units   Anti-Cardiolipin Ab, IgG (RDL) <  15 <15 GPL U/mL   Anti-Cardiolipin Ab, IgA (RDL) <12 <12 APL U/mL   Anti-Cardiolipin Ab, IgM (RDL) <13 <13 MPL U/mL   C3 Complement (RDL) 160 82 - 167 mg/dL   C4 Complement (RDL) 53 (H) 14 - 44 mg/dL   Anti-TPO Ab (RDL) <2.4 <9.0 IU/mL    Comment:    Interpretation for Anti-Sm, Anti-U1 RNP, Anti-Ro,     Anti-La:         Negative:                                <20         Weak Positive:                       20 - 39         Moderate Positive:                   40 - 80         Strong Positive:                         >80         Strong Positive:                         >59    Interpretation for Anti-Cardiolipin Ab:     Negative:               GPL <15, APL <12, MPL <13     Indeterminate:    GPL 15-20, APL 12-20, MPL 13-20     Low Positive:           GPL, APL, MPL    >20 - 40     Med Positive:           GPL, APL, MPL    >40 - 80     High Positive:          GPL, APL, MPL         >80 SLE classification criteria are based on Med to High titer (>40)  Anti-Cardiolipin Ab (aCL). aCL may be elevated transiently with certain infections and may increase spuriously in the presence of rheumatoid factor.   Lipid panel     Status: Abnormal   Collection Time: 11/22/22 10:22 AM  Result Value Ref Range   Cholesterol, Total 190 100 - 199 mg/dL   Triglycerides 401 0 - 149 mg/dL   HDL 54 >02 mg/dL   VLDL Cholesterol Cal 23 5 - 40 mg/dL   LDL Chol Calc (NIH) 725 (H) 0 - 99 mg/dL   Chol/HDL Ratio 3.5 0.0 - 4.4 ratio    Comment:                                   T. Chol/HDL Ratio                                             Men  Women                               1/2 Avg.Risk  3.4    3.3                                   Avg.Risk  5.0    4.4                                2X Avg.Risk  9.6    7.1                                3X Avg.Risk 23.4   11.0   VITAMIN D 25 Hydroxy (Vit-D Deficiency, Fractures)     Status: Abnormal   Collection Time: 11/22/22 10:22 AM  Result Value Ref Range   Vit D, 25-Hydroxy 27.6 (L) 30.0 - 100.0 ng/mL    Comment: Vitamin D deficiency has been defined by the Institute of Medicine and an Endocrine Society practice guideline as a level of serum 25-OH vitamin D less than 20 ng/mL (1,2). The Endocrine Society went on to further define vitamin D insufficiency as a level between 21 and 29 ng/mL (2). 1. IOM (Institute of Medicine). 2010. Dietary reference    intakes for calcium and D. [place]: The    [employer]. 2. Holick MF, Binkley Marshall, Bischoff-Ferrari HA, et al.    Evaluation, treatment, and prevention of vitamin D    deficiency: an Endocrine Society clinical practice    guideline. JCEM. 2011 Jul; 96(7):1911-30.   CBC With Differential     Status: None   Collection Time: 11/22/22 10:22 AM  Result Value Ref Range   WBC 8.6 3.4 - 10.8 x10E3/uL   RBC 4.41 3.77 - 5.28 x10E6/uL   Hemoglobin 13.4 11.1 - 15.9 g/dL   Hematocrit 16.1 09.6 - 46.6 %   MCV 92 79 - 97 fL   MCH 30.4 26.6 - 33.0 pg   MCHC  33.2 31.5 - 35.7 g/dL   RDW 04.5 40.9 - 81.1 %   Neutrophils 64 Not Estab. %   Lymphs 23 Not Estab. %   Monocytes 9 Not Estab. %   Eos 3 Not Estab. %   Basos 1 Not Estab. %   Neutrophils Absolute 5.5 1.4 - 7.0 x10E3/uL   Lymphocytes Absolute 2.0 0.7 - 3.1 x10E3/uL   Monocytes Absolute 0.7 0.1 - 0.9 x10E3/uL   EOS (ABSOLUTE) 0.3 0.0 - 0.4 x10E3/uL   Basophils Absolute 0.1 0.0 - 0.2 x10E3/uL   Immature Granulocytes 0 Not Estab. %   Immature Grans (Abs) 0.0 0.0 - 0.1 x10E3/uL    Comment: **Effective January 07, 2023, profile [id number] CBC/Differential**   (No Platelet) will be made non-orderable. [facility] Offers:   [id number] CBC With Differential/Platelet   CMP14+EGFR     Status: None   Collection Time: 11/22/22 10:22 AM  Result Value Ref Range   Glucose 83 70 - 99 mg/dL   BUN 11 8 - 27 mg/dL   Creatinine, Ser 9.56 0.57 - 1.00 mg/dL   eGFR 213 >08 MV/HQI/6.96   BUN/Creatinine Ratio 17 12 - 28   Sodium 143 134 - 144 mmol/L   Potassium 4.2 3.5 - 5.2 mmol/L   Chloride 102 96 - 106 mmol/L   CO2 25 20 - 29 mmol/L   Calcium 9.4 8.7 - 10.3 mg/dL   Total Protein 6.9 6.0 - 8.5 g/dL   Albumin  4.7 3.8 - 4.9 g/dL   Globulin, Total 2.2 1.5 - 4.5 g/dL   Albumin/Globulin Ratio 2.1    Bilirubin Total <0.2 0.0 - 1.2 mg/dL   Alkaline Phosphatase 101 44 - 121 IU/L   AST 22 0 - 40 IU/L   ALT 28 0 - 32 IU/L  TSH     Status: None   Collection Time: 11/22/22 10:22 AM  Result Value Ref Range   TSH 1.140 0.450 - 4.500 uIU/mL  Hemoglobin A1c     Status: Abnormal   Collection Time: 11/22/22 10:22 AM  Result Value Ref Range   Hgb A1c MFr Bld 5.8 (H) 4.8 - 5.6 %    Comment:          Prediabetes: 5.7 - 6.4          Diabetes: >6.4          Glycemic control for adults with diabetes: <7.0    Est. average glucose Bld gHb Est-mCnc 120 mg/dL  Vitamin W09     Status: None   Collection Time: 11/22/22 10:22 AM  Result Value Ref Range   Vitamin B-12 CANCELED pg/mL    Comment: Test not performed. Deterioration  occurred during specimen handling. [facility] is providing the patient with re-collection instructions.  Result canceled by the ancillary.   HSV 1 and 2 Ab, IgG     Status: Abnormal   Collection Time: 11/22/22 10:22 AM  Result Value Ref Range   HSV 1 Glycoprotein G Ab, IgG 5.28 (H) 0.00 - 0.90 index    Comment:                                  Negative        <0.91                                  Equivocal 0.91 - 1.09                                  Positive        >1.09  Note: Negative indicates no antibodies detected to  HSV-1. Equivocal may suggest early infection.  If  clinically appropriate, retest at later date. Positive  indicates antibodies detected to HSV-1.    HSV 2 IgG, Type Spec <0.91 0.00 - 0.90 index    Comment:                                  Negative        <0.91                                  Equivocal 0.91 - 1.09                                  Positive        >1.09  HSV-2 Antibody Interpretation: Current guidelines and  recommendations do not recommend routine screening  for HSV-2 in asymptomatic individuals, including  those that are pregnant. A negative antibody result  indicates no detectable antibodies to HSV-2 were  found. If recent  exposure is suspected, retest in 4  to 6 weeks. Equivocal samples should be retested in  4 to 6 weeks. A positive result indicates the  presence of detectable IgG antibody to HSV-2.  FALSE POSITIVE RESULTS MAY OCCUR. Repeat testing, or  testing by a different method, may be indicated in  some settings (e.g. patients with low likelihood of  HSV infection). If clinically appropriate, retest 4  to 6 weeks later. HSV-2 IgG antibody testing results  should be clinically correlated.   RPR w/reflex to TrepSure     Status: None   Collection Time: 11/22/22 10:22 AM  Result Value Ref Range   RPR Non Reactive Non Reactive  Acute Viral Hepatitis (HAV, HBV, HCV)     Status: None   Collection Time: 11/22/22 10:22 AM  Result Value  Ref Range   Hep A IgM Negative Negative   Hepatitis B Surface Ag Negative Negative   Hep B C IgM Negative Negative   HCV Ab Non Reactive Non Reactive  HIV Antibody (routine testing w rflx)     Status: None   Collection Time: 11/22/22 10:22 AM  Result Value Ref Range   HIV Screen 4th Generation wRfx Non Reactive Non Reactive    Comment: HIV-1/HIV-2 antibodies and HIV-1 p24 antigen were NOT detected. There is no laboratory evidence of HIV infection. HIV Negative   ANA Titer and Pattern     Status: Abnormal   Collection Time: 11/22/22 10:22 AM  Result Value Ref Range   Speckled Pattern 1:80 (H) <1:40   Spindle Apparatus Pattern 1:80 (H) <1:40   Note: Comment     Comment: ANA performed by Indirect Fluorescent Antibody (IFA)  Interpretation:     Status: None   Collection Time: 11/22/22 10:22 AM  Result Value Ref Range   HCV Interp 1: Comment     Comment: Not infected with HCV unless early or acute infection is suspected (which may be delayed in an immunocompromised individual), or other evidence exists to indicate HCV infection.   Treponemal Antibodies, TPPA     Status: Abnormal   Collection Time: 11/22/22 10:22 AM  Result Value Ref Range   Treponemal Antibodies, TPPA Reactive (A) Non Reactive   Interpretation: Comment     Comment: Syphilis: Treponemal Antibodies with Reflex to RPR and RPR           Titer, Reverse Screening and Diagnosis Algorithm ------------------------------------------------------------ Treponemal              Treponemal     Ab       RPR, Qn     Ab, TPPA    Final Interpretation ----------   --------   ----------   ----------------------- Non          N/A        N/A          No laboratory evidence Reactive                             of syphilis. Retest in                                      2-4 weeks if recent  exposure is suspected. ----------   --------   ----------   ----------------------- Reactive     Non        Non           Treponemal antibodies              Reactive   Reactive     not confirmed.                                      Inconclusive for                                      syphilis; potential                                      early syphilis,                                      possible false                                       positive. Retest in 2-4                                      weeks if recent                                      exposure is suspected. ----------   --------   ----------   ----------------------- Reactive     Non        Reactive     Treponemal antibodies              Reactive                detected. Consistent                                      with past or current                                      (potential early)                                      syphilis. ----------   --------   ----------   ----------------------- Reactive     >/=1:1     N/A          Treponemal and                                      nontreponemal  antibodies detected.                                      Consistent with current                                      or past syphilis. This test is intended ONLY for specimens that have tested positive (reactive) or equivocal for Treponema pallidum antibodies prior to submission for testing.  For the full CDC-recommended syphilis screening and diagnosis algorithm, [facility] offers test code 012005 RPR, Rfx Qn RPR/Confirm TP or 829562 T pallidum Screening Cascade.   POC CREATINE & ALBUMIN,URINE     Status: None   Collection Time: 11/22/22 11:31 AM  Result Value Ref Range   Microalbumin Ur, POC 10 mg/L   Creatinine, POC 50 mg/dL   Albumin/Creatinine Ratio, Urine, POC 30-300   POCT urinalysis dipstick     Status: None   Collection Time: 11/22/22 11:31 AM  Result Value Ref Range   Color, UA yellow    Clarity, UA clear    Glucose, UA Negative Negative   Bilirubin, UA neg    Ketones, UA  neg    Spec Grav, UA 1.020 1.010 - 1.025   Blood, UA neg    pH, UA 7.5 5.0 - 8.0   Protein, UA Negative Negative   Urobilinogen, UA 0.2 0.2 or 1.0 E.U./dL   Nitrite, UA neg    Leukocytes, UA Negative Negative   Appearance clear    Odor none        Assessment & Plan:   Problem List Items Addressed This Visit       Active Problems   Memory change    Patient stable.  Well controlled with current therapy.   Continue current meds.        Peripheral vascular disease [facility])    Patient is seen by Vascular, who manage this condition.  She is well controlled with current therapy.   Will defer to them for further changes to plan of care.       Right arm pain   Impaired gait and mobility    Patient stable.  Well controlled with current therapy.   Continue current meds.       Impaired mobility and ADLs    Patient stable.  Well controlled with current therapy.   Continue current meds.       Other Visit Diagnoses     B12 deficiency due to diet    -  Primary   Checking labs today.  Will continue supplements as needed.   Relevant Medications   cyanocobalamin (VITAMIN B12) injection 1,000 mcg (Completed)       Return in about 2 months (around 03/30/2023) for F/U.   Total time spent: 20 minutes  Miki Kins, FNP  01/28/2023   This document may have been prepared by [facility] Voice Recognition software and as such may include unintentional dictation errors.

## 2023-02-18 ENCOUNTER — Other Ambulatory Visit: Payer: Self-pay | Admitting: General Practice

## 2023-02-18 ENCOUNTER — Ambulatory Visit
Admission: RE | Admit: 2023-02-18 | Discharge: 2023-02-18 | Disposition: A | Discharge status: Home / Self Care | Payer: 59 | Source: Ambulatory Visit | Attending: General Practice | Admitting: General Practice

## 2023-02-18 DIAGNOSIS — M25552 Pain in left hip: Secondary | ICD-10-CM

## 2023-02-18 DIAGNOSIS — M5412 Radiculopathy, cervical region: Secondary | ICD-10-CM | POA: Diagnosis not present

## 2023-02-18 DIAGNOSIS — G8929 Other chronic pain: Secondary | ICD-10-CM | POA: Diagnosis not present

## 2023-02-26 ENCOUNTER — Encounter: Payer: Self-pay | Admitting: Family

## 2023-02-26 ENCOUNTER — Ambulatory Visit (INDEPENDENT_AMBULATORY_CARE_PROVIDER_SITE_OTHER): Payer: 59 | Admitting: Family

## 2023-02-26 VITALS — BP 120/75 | HR 77 | Ht 59.0 in | Wt 166.0 lb

## 2023-02-26 DIAGNOSIS — I739 Peripheral vascular disease, unspecified: Secondary | ICD-10-CM | POA: Diagnosis not present

## 2023-02-26 DIAGNOSIS — M5416 Radiculopathy, lumbar region: Secondary | ICD-10-CM

## 2023-02-26 DIAGNOSIS — M4802 Spinal stenosis, cervical region: Secondary | ICD-10-CM

## 2023-02-26 DIAGNOSIS — R413 Other amnesia: Secondary | ICD-10-CM | POA: Diagnosis not present

## 2023-02-26 NOTE — Progress Notes (Signed)
Established Patient Office Visit  Subjective:  Patient ID: Samantha Banks, female    DOB: April 02, 1961  Age: 62 y.o. MRN: [id number]  Chief Complaint  Patient presents with   Follow-up    1 mo    Patient is here today for her 1 month follow up.  She has been feeling fairly well since last appointment.   She does not have additional concerns to discuss today.  Labs are not due today. She needs refills.   I have reviewed her active problem list, medication list, allergies, notes from last encounter, lab results for her appointment today.      No other concerns at this time.   Past Medical History:  Diagnosis Date   Allergic rhinitis    Arthritis    Atypical chest pain 01/23/2019   Dyspnea 09/20/2014   Headache    chronic migraines    No pertinent past medical history     Past Surgical History:  Procedure Laterality Date   CESAREAN SECTION  1986   CESAREAN SECTION  1990   CESAREAN SECTION  1991   COLONOSCOPY WITH PROPOFOL N/A 12/17/2016   Procedure: COLONOSCOPY WITH PROPOFOL;  Surgeon: Christena Deem, MD;  Location: [facility] ENDOSCOPY;  Service: Endoscopy;  Laterality: N/A;    Social History   Socioeconomic History   Marital status: Widowed    Spouse name: Not on file   Number of children: 3   Years of education: Not on file   Highest education level: Not on file  Occupational History   Occupation: cna  Tobacco Use   Smoking status: Never   Smokeless tobacco: Never  Vaping Use   Vaping status: Never Used  Substance and Sexual Activity   Alcohol use: No    Alcohol/week: 0.0 standard drinks of alcohol   Drug use: No   Sexual activity: Not Currently    Birth control/protection: None  Other Topics Concern   Not on file  Social History Narrative   Not on file   Social Determinants of Health   Financial Resource Strain: Not on file  Food Insecurity: Low Risk  (12/02/2022)   [facility]   Hunger Vital Sign     Worried About Running Out of Food in the Last Year: Never true    Ran Out of Food in the Last Year: Never true  Transportation Needs: No Transportation Needs (12/02/2022)   [facility]   Transportation    In the past 12 months, has lack of reliable transportation kept you from medical appointments, meetings, work or from getting things needed for daily living? : No  Physical Activity: Not on file  Stress: Not on file  Social Connections: Not on file  Intimate Partner Violence: Not on file    Family History  Problem Relation Age of Onset   Throat cancer Mother    Osteoarthritis Mother    Hypertension Father    Diabetes Mellitus II Sister    Asthma Sister    Diabetes Daughter    Diabetes Daughter    Ulcers Daughter    Other Neg Hx    Stomach cancer Neg Hx    Pancreatic cancer Neg Hx    Colon cancer Neg Hx     Allergies  Allergen Reactions   Iodinated Contrast Media Hives    Pt states hives with previous injection of IV contrast, IV benadryl given 1 hour prior to injection   Latex Rash and Other (See  Comments)    Review of Systems  All other systems reviewed and are negative.      Objective:   BP 120/75   Pulse 77   Ht 4\' 11"  (1.499 m)   Wt 166 lb (75.3 kg)   LMP 02/01/2012   SpO2 99%   BMI 33.53 kg/m   Vitals:   02/26/23 1330  BP: 120/75  Pulse: 77  Height: 4\' 11"  (1.499 m)  Weight: 166 lb (75.3 kg)  SpO2: 99%  BMI (Calculated): 33.51    Physical Exam   No results found for any visits on 02/26/23.  No results found for this or any previous visit (from the past 2160 hour(s)).     Assessment & Plan:   Problem List Items Addressed This Visit       Active Problems   Lumbar radiculopathy - Primary    Patient has been referred to pain management.  Will defer to them for further changes to treatment plan.       Memory change   Peripheral vascular disease [facility])    Patient is seen by Vascular Surgery, who manage  this condition.  She is well controlled with current therapy.   Will defer to them for further changes to plan of care.       Neural foraminal stenosis of cervical spine    Patient has been referred to pain management.  Will defer to them for further changes to treatment plan.        Return in about 3 months (around 05/28/2023) for AWV.   Total time spent: 20 minutes  Miki Kins, FNP  02/26/2023   This document may have been prepared by [facility] Voice Recognition software and as such may include unintentional dictation errors.

## 2023-02-26 NOTE — Assessment & Plan Note (Signed)
Patient has been referred to pain management.  Will defer to them for further changes to treatment plan.

## 2023-02-26 NOTE — Patient Instructions (Addendum)
[facility] DSS:   Our physical location and mailing address is:  [employer] [address] [place], [place]  [address]  Phone:  [phone]

## 2023-02-26 NOTE — Assessment & Plan Note (Signed)
Patient is seen by Vascular Surgery, who manage this condition.  She is well controlled with current therapy.   Will defer to them for further changes to plan of care.

## 2023-03-01 ENCOUNTER — Other Ambulatory Visit (HOSPITAL_COMMUNITY): Payer: Self-pay

## 2023-03-04 ENCOUNTER — Other Ambulatory Visit: Payer: Self-pay

## 2023-03-05 ENCOUNTER — Other Ambulatory Visit: Payer: Self-pay

## 2023-03-05 ENCOUNTER — Encounter: Payer: Self-pay | Admitting: Pharmacist

## 2023-03-05 DIAGNOSIS — R42 Dizziness and giddiness: Secondary | ICD-10-CM

## 2023-03-05 DIAGNOSIS — R2689 Other abnormalities of gait and mobility: Secondary | ICD-10-CM

## 2023-03-05 DIAGNOSIS — M5030 Other cervical disc degeneration, unspecified cervical region: Secondary | ICD-10-CM

## 2023-03-05 DIAGNOSIS — M5416 Radiculopathy, lumbar region: Secondary | ICD-10-CM

## 2023-03-05 DIAGNOSIS — R519 Headache, unspecified: Secondary | ICD-10-CM

## 2023-03-05 DIAGNOSIS — G459 Transient cerebral ischemic attack, unspecified: Secondary | ICD-10-CM

## 2023-03-05 DIAGNOSIS — N951 Menopausal and female climacteric states: Secondary | ICD-10-CM

## 2023-03-07 ENCOUNTER — Other Ambulatory Visit: Payer: Self-pay

## 2023-03-07 ENCOUNTER — Other Ambulatory Visit: Payer: Self-pay | Admitting: Family

## 2023-03-07 ENCOUNTER — Ambulatory Visit (INDEPENDENT_AMBULATORY_CARE_PROVIDER_SITE_OTHER): Payer: 59 | Admitting: Family

## 2023-03-07 ENCOUNTER — Other Ambulatory Visit: Payer: Self-pay | Admitting: *Deleted

## 2023-03-07 ENCOUNTER — Inpatient Hospital Stay
Admission: RE | Admit: 2023-03-07 | Discharge: 2023-03-07 | Disposition: A | Discharge status: Home / Self Care | Payer: Self-pay | Source: Ambulatory Visit | Attending: Family | Admitting: Family

## 2023-03-07 VITALS — BP 140/80 | HR 88 | Ht 59.0 in | Wt 161.0 lb

## 2023-03-07 DIAGNOSIS — G43709 Chronic migraine without aura, not intractable, without status migrainosus: Secondary | ICD-10-CM | POA: Diagnosis not present

## 2023-03-07 DIAGNOSIS — G459 Transient cerebral ischemic attack, unspecified: Secondary | ICD-10-CM

## 2023-03-07 DIAGNOSIS — Z1231 Encounter for screening mammogram for malignant neoplasm of breast: Secondary | ICD-10-CM

## 2023-03-07 MED ORDER — LORATADINE 10 MG PO TABS: 10.0000 mg | ORAL_TABLET | Freq: Every day | ORAL | 0 refills | Status: DC

## 2023-03-07 MED ORDER — LOSARTAN POTASSIUM 25 MG PO TABS: 25.0000 mg | ORAL_TABLET | Freq: Every day | ORAL | 1 refills | Status: DC

## 2023-03-14 ENCOUNTER — Ambulatory Visit
Admission: RE | Admit: 2023-03-14 | Discharge: 2023-03-14 | Disposition: A | Discharge status: Home / Self Care | Payer: 59 | Source: Ambulatory Visit | Attending: Family | Admitting: Family

## 2023-03-14 DIAGNOSIS — Z1231 Encounter for screening mammogram for malignant neoplasm of breast: Secondary | ICD-10-CM | POA: Insufficient documentation

## 2023-04-10 ENCOUNTER — Other Ambulatory Visit: Payer: Self-pay

## 2023-04-12 ENCOUNTER — Encounter: Payer: Self-pay | Admitting: Family

## 2023-04-12 NOTE — Progress Notes (Signed)
   Acute Office Visit  Subjective:     Patient ID: Samantha Banks, female    DOB: 1961-03-03, 62 y.o.   MRN: [id number]  Patient is in today for  Chief Complaint  Patient presents with   Follow-up    Pressure in head and right side of throat.     Patient is here today with concerns about her head, having "pressure" in her head and her neck.  She says this is similar to the concerns she has been having previously.   She did bring a disc today with her recent records on it.   No other concerns today     Review of Systems  All other systems reviewed and are negative.       Objective:    BP (!) 140/80   Pulse 88   Ht 4\' 11"  (1.499 m)   Wt 161 lb (73 kg)   LMP 02/01/2012   SpO2 98%   BMI 32.52 kg/m   Physical Exam Vitals and nursing note reviewed.  Constitutional:      Appearance: Normal appearance. She is normal weight.  HENT:     Head: Normocephalic.  Eyes:     Conjunctiva/sclera: Conjunctivae normal.     Pupils: Pupils are equal, round, and reactive to light.  Cardiovascular:     Rate and Rhythm: Normal rate.  Pulmonary:     Effort: Pulmonary effort is normal.     Breath sounds: Normal breath sounds.  Neurological:     General: No focal deficit present.     Mental Status: She is alert and oriented to person, place, and time. Mental status is at baseline.  Psychiatric:        Mood and Affect: Mood normal.        Behavior: Behavior normal.        Thought Content: Thought content normal.        Judgment: Judgment normal.     No results found for any visits on 03/07/23.  No results found for this or any previous visit (from the past 2160 hour(s)).     Assessment & Plan:   Problem List Items Addressed This Visit       Active Problems   Chronic migraine without aura without status migrainosus, not intractable - Primary   Relevant Medications   losartan (COZAAR) 25 MG tablet   TIA (transient ischemic attack)   Relevant Medications   losartan  (COZAAR) 25 MG tablet   Sending refills for medications.  Will review the records she has brought and will decide what else to do.   Return in about 1 week (around 03/14/2023) for F/U.  Total time spent: 20 minutes  Miki Kins, FNP  03/07/2023   This document may have been prepared by [facility] Voice Recognition software and as such may include unintentional dictation errors.

## 2023-04-22 ENCOUNTER — Other Ambulatory Visit: Payer: Self-pay

## 2023-04-30 ENCOUNTER — Other Ambulatory Visit: Payer: Self-pay | Admitting: Family

## 2023-05-21 ENCOUNTER — Other Ambulatory Visit: Payer: Self-pay

## 2023-05-31 ENCOUNTER — Ambulatory Visit (INDEPENDENT_AMBULATORY_CARE_PROVIDER_SITE_OTHER): Payer: Medicaid Other | Admitting: Family

## 2023-05-31 ENCOUNTER — Encounter: Payer: Self-pay | Admitting: Family

## 2023-05-31 VITALS — BP 138/73 | HR 65 | Ht 59.0 in | Wt 162.2 lb

## 2023-05-31 DIAGNOSIS — M2550 Pain in unspecified joint: Secondary | ICD-10-CM | POA: Diagnosis not present

## 2023-05-31 DIAGNOSIS — E538 Deficiency of other specified B group vitamins: Secondary | ICD-10-CM

## 2023-05-31 DIAGNOSIS — G8929 Other chronic pain: Secondary | ICD-10-CM | POA: Diagnosis not present

## 2023-05-31 DIAGNOSIS — M549 Dorsalgia, unspecified: Secondary | ICD-10-CM | POA: Diagnosis not present

## 2023-05-31 DIAGNOSIS — Z0001 Encounter for general adult medical examination with abnormal findings: Secondary | ICD-10-CM | POA: Diagnosis not present

## 2023-05-31 DIAGNOSIS — E782 Mixed hyperlipidemia: Secondary | ICD-10-CM | POA: Diagnosis not present

## 2023-05-31 DIAGNOSIS — E559 Vitamin D deficiency, unspecified: Secondary | ICD-10-CM | POA: Diagnosis not present

## 2023-05-31 DIAGNOSIS — E039 Hypothyroidism, unspecified: Secondary | ICD-10-CM | POA: Diagnosis not present

## 2023-05-31 DIAGNOSIS — Z0000 Encounter for general adult medical examination without abnormal findings: Secondary | ICD-10-CM

## 2023-05-31 DIAGNOSIS — R7303 Prediabetes: Secondary | ICD-10-CM | POA: Diagnosis not present

## 2023-06-03 ENCOUNTER — Ambulatory Visit
Admission: RE | Admit: 2023-06-03 | Discharge: 2023-06-03 | Disposition: A | Discharge status: Home / Self Care | Payer: 59 | Source: Ambulatory Visit | Attending: Family | Admitting: Family

## 2023-06-03 ENCOUNTER — Other Ambulatory Visit: Payer: 59

## 2023-06-03 ENCOUNTER — Ambulatory Visit
Admission: RE | Admit: 2023-06-03 | Discharge: 2023-06-03 | Disposition: A | Discharge status: Home / Self Care | Payer: 59 | Attending: Family | Admitting: Family

## 2023-06-03 DIAGNOSIS — E538 Deficiency of other specified B group vitamins: Secondary | ICD-10-CM | POA: Diagnosis not present

## 2023-06-03 DIAGNOSIS — M47816 Spondylosis without myelopathy or radiculopathy, lumbar region: Secondary | ICD-10-CM | POA: Diagnosis not present

## 2023-06-03 DIAGNOSIS — G8929 Other chronic pain: Secondary | ICD-10-CM | POA: Insufficient documentation

## 2023-06-03 DIAGNOSIS — M2550 Pain in unspecified joint: Secondary | ICD-10-CM | POA: Diagnosis not present

## 2023-06-03 DIAGNOSIS — E039 Hypothyroidism, unspecified: Secondary | ICD-10-CM | POA: Diagnosis not present

## 2023-06-03 DIAGNOSIS — M549 Dorsalgia, unspecified: Secondary | ICD-10-CM | POA: Insufficient documentation

## 2023-06-03 DIAGNOSIS — E559 Vitamin D deficiency, unspecified: Secondary | ICD-10-CM | POA: Diagnosis not present

## 2023-06-03 DIAGNOSIS — E782 Mixed hyperlipidemia: Secondary | ICD-10-CM | POA: Diagnosis not present

## 2023-06-03 DIAGNOSIS — R7303 Prediabetes: Secondary | ICD-10-CM | POA: Diagnosis not present

## 2023-06-04 LAB — CBC WITH DIFFERENTIAL/PLATELET
Basophils Absolute: 0.1 10*3/uL (ref 0.0–0.2)
Basos: 1 %
EOS (ABSOLUTE): 0.2 10*3/uL (ref 0.0–0.4)
Eos: 2 %
Hematocrit: 46.4 % (ref 34.0–46.6)
Hemoglobin: 15.1 g/dL (ref 11.1–15.9)
Immature Grans (Abs): 0 10*3/uL (ref 0.0–0.1)
Immature Granulocytes: 0 %
Lymphocytes Absolute: 2.2 10*3/uL (ref 0.7–3.1)
Lymphs: 24 %
MCH: 30 pg (ref 26.6–33.0)
MCHC: 32.5 g/dL (ref 31.5–35.7)
MCV: 92 fL (ref 79–97)
Monocytes Absolute: 0.7 10*3/uL (ref 0.1–0.9)
Monocytes: 7 %
Neutrophils Absolute: 6.1 10*3/uL (ref 1.4–7.0)
Neutrophils: 66 %
Platelets: 292 10*3/uL (ref 150–450)
RBC: 5.03 x10E6/uL (ref 3.77–5.28)
RDW: 12.8 % (ref 11.7–15.4)
WBC: 9.2 10*3/uL (ref 3.4–10.8)

## 2023-06-04 LAB — CMP14+EGFR
ALT: 45 [IU]/L — ABNORMAL HIGH (ref 0–32)
AST: 34 [IU]/L (ref 0–40)
Albumin: 4.6 g/dL (ref 3.9–4.9)
Alkaline Phosphatase: 109 [IU]/L (ref 44–121)
BUN/Creatinine Ratio: 18 (ref 12–28)
BUN: 12 mg/dL (ref 8–27)
Bilirubin Total: 0.3 mg/dL (ref 0.0–1.2)
CO2: 27 mmol/L (ref 20–29)
Calcium: 9.5 mg/dL (ref 8.7–10.3)
Chloride: 102 mmol/L (ref 96–106)
Creatinine, Ser: 0.66 mg/dL (ref 0.57–1.00)
Globulin, Total: 2 g/dL (ref 1.5–4.5)
Glucose: 82 mg/dL (ref 70–99)
Potassium: 4.4 mmol/L (ref 3.5–5.2)
Sodium: 140 mmol/L (ref 134–144)
Total Protein: 6.6 g/dL (ref 6.0–8.5)
eGFR: 100 mL/min/{1.73_m2} (ref >59)

## 2023-06-04 LAB — LIPID PANEL
Chol/HDL Ratio: 3 {ratio} (ref 0.0–4.4)
Cholesterol, Total: 109 mg/dL (ref 100–199)
HDL: 36 mg/dL — ABNORMAL LOW (ref >39)
LDL Chol Calc (NIH): 58 mg/dL (ref 0–99)
Triglycerides: 69 mg/dL (ref 0–149)
VLDL Cholesterol Cal: 15 mg/dL (ref 5–40)

## 2023-06-04 LAB — VITAMIN D 25 HYDROXY (VIT D DEFICIENCY, FRACTURES): Vit D, 25-Hydroxy: 29.5 ng/mL — ABNORMAL LOW (ref 30.0–100.0)

## 2023-06-04 LAB — RHEUMATOID ARTHRITIS PROFILE
Cyclic Citrullin Peptide Ab: 6 U (ref 0–19)
Rheumatoid fact SerPl-aCnc: <10 [IU]/mL (ref <14.0)

## 2023-06-04 LAB — VITAMIN B12: Vitamin B-12: 783 pg/mL (ref 232–1245)

## 2023-06-04 LAB — TSH: TSH: 0.801 u[IU]/mL (ref 0.450–4.500)

## 2023-06-04 LAB — HEMOGLOBIN A1C
Est. average glucose Bld gHb Est-mCnc: 123 mg/dL
Hgb A1c MFr Bld: 5.9 % — ABNORMAL HIGH (ref 4.8–5.6)

## 2023-06-10 ENCOUNTER — Telehealth: Payer: Self-pay | Admitting: Family

## 2023-06-10 NOTE — Telephone Encounter (Signed)
Patient called in inquiring about her lab results. Please advise.

## 2023-06-14 NOTE — Progress Notes (Signed)
 Complete physical exam  Patient: Samantha Banks   DOB: August 30, 1960   63 y.o. Female  MRN: [id number]  Subjective:    Chief Complaint  Patient presents with   Annual Exam    Samantha Banks is a 63 y.o. female who presents today for a complete physical exam. She reports consuming a general diet. The patient does not participate in regular exercise at present. She generally feels fairly well. She reports sleeping fairly well. She does have additional problems to discuss today.   Most recent depression screenings:    08/29/2023    2:49 PM 08/13/2022    1:42 PM  PHQ 2/9 Scores  PHQ - 2 Score 2 0  PHQ- 9 Score 5 0    Past Medical History:  Diagnosis Date   Allergic rhinitis    Arthritis    Atypical chest pain 01/23/2019   Dyspnea 09/20/2014   Headache    chronic migraines    No pertinent past medical history     Past Surgical History:  Procedure Laterality Date   CESAREAN SECTION  1986   CESAREAN SECTION  1990   CESAREAN SECTION  1991   COLONOSCOPY WITH PROPOFOL N/A 12/17/2016   Procedure: COLONOSCOPY WITH PROPOFOL;  Surgeon: Christena Deem, MD;  Location: [facility] ENDOSCOPY;  Service: Endoscopy;  Laterality: N/A;    Family History  Problem Relation Age of Onset   Throat cancer Mother    Osteoarthritis Mother    Hypertension Father    Diabetes Mellitus II Sister    Asthma Sister    Diabetes Daughter    Diabetes Daughter    Ulcers Daughter    Other Neg Hx    Stomach cancer Neg Hx    Pancreatic cancer Neg Hx    Colon cancer Neg Hx     Social History   Socioeconomic History   Marital status: Widowed    Spouse name: Not on file   Number of children: 3   Years of education: Not on file   Highest education level: Not on file  Occupational History   Occupation: cna  Tobacco Use   Smoking status: Never   Smokeless tobacco: Never  Vaping Use   Vaping status: Never Used  Substance and Sexual Activity   Alcohol use: No    Alcohol/week: 0.0 standard  drinks of alcohol   Drug use: No   Sexual activity: Not Currently    Birth control/protection: None  Other Topics Concern   Not on file  Social History Narrative   Not on file   Social Drivers of Health   Financial Resource Strain: Not on file  Food Insecurity: Low Risk  (12/02/2022)   [facility]   Hunger Vital Sign    Worried About Running Out of Food in the Last Year: Never true    Ran Out of Food in the Last Year: Never true  Transportation Needs: No Transportation Needs (12/02/2022)   [facility]   Transportation    In the past 12 months, has lack of reliable transportation kept you from medical appointments, meetings, work or from getting things needed for daily living? : No  Physical Activity: Not on file  Stress: Not on file  Social Connections: Not on file  Intimate Partner Violence: Not on file    Outpatient Medications Prior to Visit  Medication Sig   atorvastatin (LIPITOR) 40 MG tablet Take 1 tablet (40 mg total) by mouth at bedtime.  [employer] ASPIRIN LOW DOSE 81 MG tablet Take 81 mg by mouth daily.   gabapentin (NEURONTIN) 100 MG capsule Take 1 capsule (100 mg total) by mouth 3 (three) times daily.   loratadine (CLARITIN) 10 MG tablet TAKE 1 TABLET BY MOUTH EVERY DAY   losartan (COZAAR) 25 MG tablet Take 1 tablet (25 mg total) by mouth daily.   No facility-administered medications prior to visit.    Review of Systems  All other systems reviewed and are negative.       Objective:     BP 138/73   Pulse 65   Ht 4\' 11"  (1.499 m)   Wt 162 lb 3.2 oz (73.6 kg)   LMP 02/01/2012   SpO2 98%   BMI 32.76 kg/m     Physical Exam Vitals and nursing note reviewed.  Constitutional:      Appearance: Normal appearance. She is normal weight.  HENT:     Head: Normocephalic.  Eyes:     Extraocular Movements: Extraocular movements intact.     Conjunctiva/sclera: Conjunctivae normal.     Pupils: Pupils are  equal, round, and reactive to light.  Cardiovascular:     Rate and Rhythm: Normal rate.  Pulmonary:     Effort: Pulmonary effort is normal.  Neurological:     General: No focal deficit present.     Mental Status: She is alert and oriented to person, place, and time. Mental status is at baseline.  Psychiatric:        Mood and Affect: Mood normal.        Behavior: Behavior normal.        Thought Content: Thought content normal.        Judgment: Judgment normal.      Results for orders placed or performed in visit on 05/31/23  Rheumatoid Arthritis Profile  Result Value Ref Range   Rheumatoid fact SerPl-aCnc <10.0 <14.0 IU/mL   Cyclic Citrullin Peptide Ab 6 0 - 19 units  Lipid panel  Result Value Ref Range   Cholesterol, Total 109 100 - 199 mg/dL   Triglycerides 69 0 - 149 mg/dL   HDL 36 (L) >16 mg/dL   VLDL Cholesterol Cal 15 5 - 40 mg/dL   LDL Chol Calc (NIH) 58 0 - 99 mg/dL   Chol/HDL Ratio 3.0 0.0 - 4.4 ratio  VITAMIN D 25 Hydroxy (Vit-D Deficiency, Fractures)  Result Value Ref Range   Vit D, 25-Hydroxy 29.5 (L) 30.0 - 100.0 ng/mL  CMP14+EGFR  Result Value Ref Range   Glucose 82 70 - 99 mg/dL   BUN 12 8 - 27 mg/dL   Creatinine, Ser 1.09 0.57 - 1.00 mg/dL   eGFR 604 >54 UJ/WJX/9.14   BUN/Creatinine Ratio 18 12 - 28   Sodium 140 134 - 144 mmol/L   Potassium 4.4 3.5 - 5.2 mmol/L   Chloride 102 96 - 106 mmol/L   CO2 27 20 - 29 mmol/L   Calcium 9.5 8.7 - 10.3 mg/dL   Total Protein 6.6 6.0 - 8.5 g/dL   Albumin 4.6 3.9 - 4.9 g/dL   Globulin, Total 2.0 1.5 - 4.5 g/dL   Bilirubin Total 0.3 0.0 - 1.2 mg/dL   Alkaline Phosphatase 109 44 - 121 IU/L   AST 34 0 - 40 IU/L   ALT 45 (H) 0 - 32 IU/L  TSH  Result Value Ref Range   TSH 0.801 0.450 - 4.500 uIU/mL  Hemoglobin A1c  Result Value Ref Range   Hgb A1c MFr Bld  5.9 (H) 4.8 - 5.6 %   Est. average glucose Bld gHb Est-mCnc 123 mg/dL  Vitamin M57  Result Value Ref Range   Vitamin B-12 783 232 - 1,245 pg/mL  CBC with Diff   Result Value Ref Range   WBC 9.2 3.4 - 10.8 x10E3/uL   RBC 5.03 3.77 - 5.28 x10E6/uL   Hemoglobin 15.1 11.1 - 15.9 g/dL   Hematocrit 84.6 96.2 - 46.6 %   MCV 92 79 - 97 fL   MCH 30.0 26.6 - 33.0 pg   MCHC 32.5 31.5 - 35.7 g/dL   RDW 95.2 84.1 - 32.4 %   Platelets 292 150 - 450 x10E3/uL   Neutrophils 66 Not Estab. %   Lymphs 24 Not Estab. %   Monocytes 7 Not Estab. %   Eos 2 Not Estab. %   Basos 1 Not Estab. %   Neutrophils Absolute 6.1 1.4 - 7.0 x10E3/uL   Lymphocytes Absolute 2.2 0.7 - 3.1 x10E3/uL   Monocytes Absolute 0.7 0.1 - 0.9 x10E3/uL   EOS (ABSOLUTE) 0.2 0.0 - 0.4 x10E3/uL   Basophils Absolute 0.1 0.0 - 0.2 x10E3/uL   Immature Granulocytes 0 Not Estab. %   Immature Grans (Abs) 0.0 0.0 - 0.1 x10E3/uL    Recent Results (from the past 2160 hours)  POCT Rapid Influenza A&B     Status: Abnormal   Collection Time: 07/05/23  1:41 PM  Result Value Ref Range   FLU A Positive    FLU B Negative   Rheumatoid Arthritis Profile     Status: None   Collection Time: 08/29/23  3:25 PM  Result Value Ref Range   Rheumatoid fact SerPl-aCnc <10.0 <14.0 IU/mL   Cyclic Citrullin Peptide Ab 6 0 - 19 units    Comment:                           Negative               <20                           Weak positive      20 - 39                           Moderate positive  40 - 59                           Strong positive        >59         Assessment & Plan:    Routine Health Maintenance and Physical Exam  Immunization History  Administered Date(s) Administered   Influenza-Unspecified 03/30/2015   Moderna Sars-Covid-2 Vaccination 03/18/2020, 04/15/2020   Tdap 03/30/2015    Health Maintenance  Topic Date Due   Zoster Vaccines- Shingrix (1 of 2) Never done   Cervical Cancer Screening (HPV/Pap Cotest)  05/22/2018   COVID-19 Vaccine (3 - Moderna risk series) 05/13/2020   INFLUENZA VACCINE  01/10/2023   MAMMOGRAM  03/13/2025   DTaP/Tdap/Td (2 - Td or Tdap) 03/29/2025    Colonoscopy  12/18/2026   Hepatitis C Screening  Completed   HIV Screening  Completed   HPV VACCINES  Aged Out    Discussed health benefits of physical activity, and encouraged her to engage in regular exercise appropriate for her age and condition.  Problem List Items Addressed This Visit       Other   Hyperlipidemia   Checking labs today.  Continue current therapy for lipid control. Will modify as needed based on labwork results.        Relevant Orders   Lipid panel (Completed)   CMP14+EGFR (Completed)   CBC with Diff (Completed)   Other Visit Diagnoses       Chronic midline back pain, unspecified back location    -  Primary   Relevant Orders   DG Lumbar Spine Complete (Completed)   DG Thoracic Spine 2 View (Completed)   Rheumatoid Arthritis Profile (Completed)   CMP14+EGFR (Completed)   CBC with Diff (Completed)     Arthralgia, unspecified joint       Relevant Orders   Rheumatoid Arthritis Profile (Completed)   CMP14+EGFR (Completed)   CBC with Diff (Completed)     B12 deficiency due to diet       Checking labs today.  Will continue supplements as needed.   Relevant Orders   CMP14+EGFR (Completed)   Vitamin B12 (Completed)   CBC with Diff (Completed)     Prediabetes       A1C is in prediabetic ranges. Patient counseled on dietary choices and verbalized understanding. Will reassess at follow up after next lab check.   Relevant Orders   CMP14+EGFR (Completed)   Hemoglobin A1c (Completed)   CBC with Diff (Completed)     Vitamin D deficiency, unspecified       Checking labs today.  Will continue supplements as needed.   Relevant Orders   VITAMIN D 25 Hydroxy (Vit-D Deficiency, Fractures) (Completed)   CMP14+EGFR (Completed)   CBC with Diff (Completed)     Hypothyroidism (acquired)       Checking labs today.  Will continue supplements as needed.   Relevant Orders   CMP14+EGFR (Completed)   TSH (Completed)   CBC with Diff (Completed)     Routine general  medical examination at a health care facility          Return in about 3 months (around 08/29/2023).     Miki Kins, FNP  05/31/2023   This document may have been prepared by [facility] Voice Recognition software and as such may include unintentional dictation errors.

## 2023-07-05 ENCOUNTER — Encounter: Payer: Self-pay | Admitting: Cardiology

## 2023-07-05 ENCOUNTER — Ambulatory Visit (INDEPENDENT_AMBULATORY_CARE_PROVIDER_SITE_OTHER): Payer: 59 | Admitting: Cardiology

## 2023-07-05 VITALS — BP 146/88 | HR 86 | Temp 99.5°F | Ht 59.0 in | Wt 164.0 lb

## 2023-07-05 DIAGNOSIS — R6889 Other general symptoms and signs: Secondary | ICD-10-CM | POA: Insufficient documentation

## 2023-07-05 DIAGNOSIS — I10 Essential (primary) hypertension: Secondary | ICD-10-CM

## 2023-07-05 DIAGNOSIS — J09X2 Influenza due to identified novel influenza A virus with other respiratory manifestations: Secondary | ICD-10-CM

## 2023-07-05 DIAGNOSIS — R051 Acute cough: Secondary | ICD-10-CM

## 2023-07-05 LAB — POCT RAPID INFLUENZA A&B
FLU A: POSITIVE
FLU B: NEGATIVE

## 2023-07-05 MED ORDER — OSELTAMIVIR PHOSPHATE 75 MG PO CAPS: 75.0000 mg | ORAL_CAPSULE | Freq: Two times a day (BID) | ORAL | 0 refills | Status: AC

## 2023-07-05 NOTE — Progress Notes (Signed)
Established Patient Office Visit  Subjective:  Patient ID: Samantha Banks, female    DOB: [birth year]  Age: 63 y.o. MRN: [id number]  Chief Complaint  Patient presents with   Acute Visit    No Appetite, Weak, Cough, Lightheaded    Patient in office for an acute visit. Complaining of feeling weak, no appetite, headache. Started feeling poorly yesterday. Rapid flu test positive for Flu A. Will send in Tamiflu. Recommend drinking water and Gatorade as tolerated.   Influenza This is a new problem. The current episode started yesterday. The problem has been unchanged. Associated symptoms include coughing, headaches and weakness. Pertinent negatives include no abdominal pain, chest pain, myalgias or nausea. Nothing aggravates the symptoms. She has tried nothing for the symptoms. The treatment provided no relief.    No other concerns at this time.   Past Medical History:  Diagnosis Date   Allergic rhinitis    Arthritis    Atypical chest pain 01/23/2019   Dyspnea 09/20/2014   Headache    chronic migraines    No pertinent past medical history     Past Surgical History:  Procedure Laterality Date   CESAREAN SECTION  1986   CESAREAN SECTION  1990   CESAREAN SECTION  1991   COLONOSCOPY WITH PROPOFOL N/A 12/17/2016   Procedure: COLONOSCOPY WITH PROPOFOL;  Surgeon: Christena Deem, MD;  Location: [facility] ENDOSCOPY;  Service: Endoscopy;  Laterality: N/A;    Social History   Socioeconomic History   Marital status: Widowed    Spouse name: Not on file   Number of children: 3   Years of education: Not on file   Highest education level: Not on file  Occupational History   Occupation: cna  Tobacco Use   Smoking status: Never   Smokeless tobacco: Never  Vaping Use   Vaping status: Never Used  Substance and Sexual Activity   Alcohol use: No    Alcohol/week: 0.0 standard drinks of alcohol   Drug use: No   Sexual activity: Not Currently    Birth control/protection: None  Other  Topics Concern   Not on file  Social History Narrative   Not on file   Social Drivers of Health   Financial Resource Strain: Not on file  Food Insecurity: Low Risk  (12/02/2022)   [facility]   Hunger Vital Sign    Worried About Running Out of Food in the Last Year: Never true    Ran Out of Food in the Last Year: Never true  Transportation Needs: No Transportation Needs (12/02/2022)   [facility]   Transportation    In the past 12 months, has lack of reliable transportation kept you from medical appointments, meetings, work or from getting things needed for daily living? : No  Physical Activity: Not on file  Stress: Not on file  Social Connections: Not on file  Intimate Partner Violence: Not on file    Family History  Problem Relation Age of Onset   Throat cancer Mother    Osteoarthritis Mother    Hypertension Father    Diabetes Mellitus II Sister    Asthma Sister    Diabetes Daughter    Diabetes Daughter    Ulcers Daughter    Other Neg Hx    Stomach cancer Neg Hx    Pancreatic cancer Neg Hx    Colon cancer Neg Hx     Allergies  Allergen Reactions   Iodinated Contrast Media  Hives    Pt states hives with previous injection of IV contrast, IV benadryl given 1 hour prior to injection   Latex Rash and Other (See Comments)    Outpatient Medications Prior to Visit  Medication Sig   atorvastatin (LIPITOR) 40 MG tablet Take 1 tablet (40 mg total) by mouth at bedtime.   [facility] ASPIRIN LOW DOSE 81 MG tablet Take 81 mg by mouth daily.   gabapentin (NEURONTIN) 100 MG capsule Take 1 capsule (100 mg total) by mouth 3 (three) times daily.   loratadine (CLARITIN) 10 MG tablet TAKE 1 TABLET BY MOUTH EVERY DAY   losartan (COZAAR) 25 MG tablet Take 1 tablet (25 mg total) by mouth daily.   No facility-administered medications prior to visit.    Review of Systems  Constitutional:  Positive for malaise/fatigue.  HENT:  Negative.    Eyes: Negative.   Respiratory:  Positive for cough. Negative for shortness of breath.   Cardiovascular: Negative.  Negative for chest pain.  Gastrointestinal: Negative.  Negative for abdominal pain, constipation, diarrhea and nausea.  Genitourinary: Negative.   Musculoskeletal:  Negative for joint pain and myalgias.  Skin: Negative.   Neurological:  Positive for weakness and headaches. Negative for dizziness.  Endo/Heme/Allergies: Negative.   All other systems reviewed and are negative.      Objective:   BP (!) 146/88   Pulse 86   Temp 99.5 F (37.5 C) (Tympanic)   Ht 4\' 11"  (1.499 m)   Wt 164 lb (74.4 kg)   LMP 02/01/2012   SpO2 97%   BMI 33.12 kg/m   Vitals:   07/05/23 1332  BP: (!) 146/88  Pulse: 86  Temp: 99.5 F (37.5 C)  Height: 4\' 11"  (1.499 m)  Weight: 164 lb (74.4 kg)  SpO2: 97%  TempSrc: Tympanic  BMI (Calculated): 33.11    Physical Exam Vitals and nursing note reviewed.  Constitutional:      Appearance: Normal appearance. She is normal weight.  HENT:     Head: Normocephalic and atraumatic.     Nose: Nose normal.     Mouth/Throat:     Mouth: Mucous membranes are moist.  Eyes:     Extraocular Movements: Extraocular movements intact.     Conjunctiva/sclera: Conjunctivae normal.     Pupils: Pupils are equal, round, and reactive to light.  Cardiovascular:     Rate and Rhythm: Normal rate and regular rhythm.     Pulses: Normal pulses.     Heart sounds: Normal heart sounds.  Pulmonary:     Effort: Pulmonary effort is normal.     Breath sounds: Normal breath sounds.  Abdominal:     General: Abdomen is flat. Bowel sounds are normal.     Palpations: Abdomen is soft.  Musculoskeletal:        General: Normal range of motion.     Cervical back: Normal range of motion.  Skin:    General: Skin is warm and dry.  Neurological:     General: No focal deficit present.     Mental Status: She is alert and oriented to person, place, and time.   Psychiatric:        Mood and Affect: Mood normal.        Behavior: Behavior normal.        Thought Content: Thought content normal.        Judgment: Judgment normal.      Results for orders placed or performed in visit on 07/05/23  POCT Rapid  Influenza A&B  Result Value Ref Range   FLU A Positive    FLU B Negative     Recent Results (from the past 2160 hours)  Rheumatoid Arthritis Profile     Status: None   Collection Time: 06/03/23  1:02 PM  Result Value Ref Range   Rheumatoid fact SerPl-aCnc <10.0 <14.0 IU/mL   Cyclic Citrullin Peptide Ab 6 0 - 19 units    Comment:                           Negative               <20                           Weak positive      20 - 39                           Moderate positive  40 - 59                           Strong positive        >59   Lipid panel     Status: Abnormal   Collection Time: 06/03/23  1:02 PM  Result Value Ref Range   Cholesterol, Total 109 100 - 199 mg/dL   Triglycerides 69 0 - 149 mg/dL   HDL 36 (L) >56 mg/dL   VLDL Cholesterol Cal 15 5 - 40 mg/dL   LDL Chol Calc (NIH) 58 0 - 99 mg/dL   Chol/HDL Ratio 3.0 0.0 - 4.4 ratio    Comment:                                   T. Chol/HDL Ratio                                             Men  Women                               1/2 Avg.Risk  3.4    3.3                                   Avg.Risk  5.0    4.4                                2X Avg.Risk  9.6    7.1                                3X Avg.Risk 23.4   11.0   VITAMIN D 25 Hydroxy (Vit-D Deficiency, Fractures)     Status: Abnormal   Collection Time: 06/03/23  1:02 PM  Result Value Ref Range   Vit D, 25-Hydroxy 29.5 (L) 30.0 - 100.0 ng/mL    Comment: Vitamin D deficiency has been defined by the Institute of Medicine and an Endocrine Society practice  guideline as a level of serum 25-OH vitamin D less than 20 ng/mL (1,2). The Endocrine Society went on to further define vitamin D insufficiency as a level between 21  and 29 ng/mL (2). 1. IOM (Institute of Medicine). 2010. Dietary reference    intakes for calcium and D. [place]: The    [employer]. 2. Holick MF, Binkley [place], Bischoff-Ferrari HA, et al.    Evaluation, treatment, and prevention of vitamin D    deficiency: an Endocrine Society clinical practice    guideline. JCEM. 2011 Jul; 96(7):1911-30.   CMP14+EGFR     Status: Abnormal   Collection Time: 06/03/23  1:02 PM  Result Value Ref Range   Glucose 82 70 - 99 mg/dL   BUN 12 8 - 27 mg/dL   Creatinine, Ser 1.47 0.57 - 1.00 mg/dL   eGFR 829 >56 OZ/HYQ/6.57   BUN/Creatinine Ratio 18 12 - 28   Sodium 140 134 - 144 mmol/L   Potassium 4.4 3.5 - 5.2 mmol/L   Chloride 102 96 - 106 mmol/L   CO2 27 20 - 29 mmol/L   Calcium 9.5 8.7 - 10.3 mg/dL   Total Protein 6.6 6.0 - 8.5 g/dL   Albumin 4.6 3.9 - 4.9 g/dL   Globulin, Total 2.0 1.5 - 4.5 g/dL   Bilirubin Total 0.3 0.0 - 1.2 mg/dL   Alkaline Phosphatase 109 44 - 121 IU/L   AST 34 0 - 40 IU/L   ALT 45 (H) 0 - 32 IU/L  TSH     Status: None   Collection Time: 06/03/23  1:02 PM  Result Value Ref Range   TSH 0.801 0.450 - 4.500 uIU/mL  Hemoglobin A1c     Status: Abnormal   Collection Time: 06/03/23  1:02 PM  Result Value Ref Range   Hgb A1c MFr Bld 5.9 (H) 4.8 - 5.6 %    Comment:          Prediabetes: 5.7 - 6.4          Diabetes: >6.4          Glycemic control for adults with diabetes: <7.0    Est. average glucose Bld gHb Est-mCnc 123 mg/dL  Vitamin Q46     Status: None   Collection Time: 06/03/23  1:02 PM  Result Value Ref Range   Vitamin B-12 783 232 - 1,245 pg/mL  CBC with Diff     Status: None   Collection Time: 06/03/23  1:02 PM  Result Value Ref Range   WBC 9.2 3.4 - 10.8 x10E3/uL   RBC 5.03 3.77 - 5.28 x10E6/uL   Hemoglobin 15.1 11.1 - 15.9 g/dL   Hematocrit 96.2 95.2 - 46.6 %   MCV 92 79 - 97 fL   MCH 30.0 26.6 - 33.0 pg   MCHC 32.5 31.5 - 35.7 g/dL   RDW 84.1 32.4 - 40.1 %   Platelets 292 150 - 450 x10E3/uL    Neutrophils 66 Not Estab. %   Lymphs 24 Not Estab. %   Monocytes 7 Not Estab. %   Eos 2 Not Estab. %   Basos 1 Not Estab. %   Neutrophils Absolute 6.1 1.4 - 7.0 x10E3/uL   Lymphocytes Absolute 2.2 0.7 - 3.1 x10E3/uL   Monocytes Absolute 0.7 0.1 - 0.9 x10E3/uL   EOS (ABSOLUTE) 0.2 0.0 - 0.4 x10E3/uL   Basophils Absolute 0.1 0.0 - 0.2 x10E3/uL   Immature Granulocytes 0 Not Estab. %   Immature Grans (Abs) 0.0 0.0 - 0.1  x10E3/uL  POCT Rapid Influenza A&B     Status: Abnormal   Collection Time: 07/05/23  1:41 PM  Result Value Ref Range   FLU A Positive    FLU B Negative       Assessment & Plan:  Tamiflu Drink water and Gatorade as tolerated  Problem List Items Addressed This Visit       Other   Flu-like symptoms - Primary   Relevant Orders   POCT Rapid Influenza A&B (Completed)    Return if symptoms worsen or fail to improve.   Total time spent: 25 minutes  [employer], NP  07/05/2023   This document may have been prepared by Dragon Voice Recognition software and as such may include unintentional dictation errors.

## 2023-08-12 ENCOUNTER — Encounter: Payer: Self-pay | Admitting: Physician Assistant

## 2023-08-29 ENCOUNTER — Encounter: Payer: Self-pay | Admitting: Family

## 2023-08-29 ENCOUNTER — Ambulatory Visit: Payer: 59 | Admitting: Family

## 2023-08-29 VITALS — BP 124/84 | HR 75 | Ht 59.0 in | Wt 160.0 lb

## 2023-08-29 DIAGNOSIS — R7303 Prediabetes: Secondary | ICD-10-CM

## 2023-08-29 DIAGNOSIS — E782 Mixed hyperlipidemia: Secondary | ICD-10-CM

## 2023-08-29 DIAGNOSIS — R202 Paresthesia of skin: Secondary | ICD-10-CM

## 2023-08-29 DIAGNOSIS — R2689 Other abnormalities of gait and mobility: Secondary | ICD-10-CM

## 2023-08-29 DIAGNOSIS — G8929 Other chronic pain: Secondary | ICD-10-CM

## 2023-08-29 DIAGNOSIS — R413 Other amnesia: Secondary | ICD-10-CM

## 2023-08-29 DIAGNOSIS — M2550 Pain in unspecified joint: Secondary | ICD-10-CM

## 2023-08-29 DIAGNOSIS — Z7409 Other reduced mobility: Secondary | ICD-10-CM

## 2023-08-29 DIAGNOSIS — R42 Dizziness and giddiness: Secondary | ICD-10-CM

## 2023-08-29 DIAGNOSIS — E538 Deficiency of other specified B group vitamins: Secondary | ICD-10-CM

## 2023-08-29 DIAGNOSIS — Z0130 Encounter for examination of blood pressure without abnormal findings: Secondary | ICD-10-CM

## 2023-08-29 DIAGNOSIS — R519 Headache, unspecified: Secondary | ICD-10-CM

## 2023-08-29 DIAGNOSIS — M792 Neuralgia and neuritis, unspecified: Secondary | ICD-10-CM | POA: Diagnosis not present

## 2023-08-29 DIAGNOSIS — Z789 Other specified health status: Secondary | ICD-10-CM

## 2023-08-29 DIAGNOSIS — E559 Vitamin D deficiency, unspecified: Secondary | ICD-10-CM

## 2023-08-31 LAB — RHEUMATOID ARTHRITIS PROFILE
Cyclic Citrullin Peptide Ab: 6 U (ref 0–19)
Rheumatoid fact SerPl-aCnc: <10 [IU]/mL (ref <14.0)

## 2023-09-01 ENCOUNTER — Encounter: Payer: Self-pay | Admitting: Family

## 2023-09-01 NOTE — Assessment & Plan Note (Signed)
 Checking labs today.  Continue current therapy for lipid control. Will modify as needed based on labwork results.

## 2023-09-02 LAB — CMP14+EGFR
ALT: 32 IU/L (ref 0–32)
AST: 27 IU/L (ref 0–40)
Albumin: 4.5 g/dL (ref 3.9–4.9)
Alkaline Phosphatase: 114 IU/L (ref 44–121)
BUN/Creatinine Ratio: 21 (ref 12–28)
BUN: 13 mg/dL (ref 8–27)
Bilirubin Total: <0.2 mg/dL (ref 0.0–1.2)
CO2: 21 mmol/L (ref 20–29)
Calcium: 9.1 mg/dL (ref 8.7–10.3)
Chloride: 105 mmol/L (ref 96–106)
Creatinine, Ser: 0.61 mg/dL (ref 0.57–1.00)
Globulin, Total: 1.9 g/dL (ref 1.5–4.5)
Glucose: 87 mg/dL (ref 70–99)
Potassium: 4.4 mmol/L (ref 3.5–5.2)
Sodium: 144 mmol/L (ref 134–144)
Total Protein: 6.4 g/dL (ref 6.0–8.5)
eGFR: 102 mL/min/{1.73_m2} (ref >59)

## 2023-09-02 LAB — CBC WITH DIFFERENTIAL/PLATELET
Basophils Absolute: 0.1 10*3/uL (ref 0.0–0.2)
Basos: 1 %
EOS (ABSOLUTE): 0.3 10*3/uL (ref 0.0–0.4)
Eos: 3 %
Hematocrit: 41.3 % (ref 34.0–46.6)
Hemoglobin: 13.1 g/dL (ref 11.1–15.9)
Immature Grans (Abs): 0 10*3/uL (ref 0.0–0.1)
Immature Granulocytes: 0 %
Lymphocytes Absolute: 2.4 10*3/uL (ref 0.7–3.1)
Lymphs: 25 %
MCH: 29 pg (ref 26.6–33.0)
MCHC: 31.7 g/dL (ref 31.5–35.7)
MCV: 91 fL (ref 79–97)
Monocytes Absolute: 0.8 10*3/uL (ref 0.1–0.9)
Monocytes: 9 %
Neutrophils Absolute: 6 10*3/uL (ref 1.4–7.0)
Neutrophils: 62 %
Platelets: 228 10*3/uL (ref 150–450)
RBC: 4.52 x10E6/uL (ref 3.77–5.28)
RDW: 12.6 % (ref 11.7–15.4)
WBC: 9.7 10*3/uL (ref 3.4–10.8)

## 2023-09-02 LAB — FOLATE RBC
Folate, Hemolysate: 381 ng/mL
Folate, RBC: 923 ng/mL (ref >498)

## 2023-09-02 LAB — LIPID PANEL
Chol/HDL Ratio: 2.6 ratio (ref 0.0–4.4)
Cholesterol, Total: 110 mg/dL (ref 100–199)
HDL: 42 mg/dL (ref >39)
LDL Chol Calc (NIH): 55 mg/dL (ref 0–99)
Triglycerides: 55 mg/dL (ref 0–149)
VLDL Cholesterol Cal: 13 mg/dL (ref 5–40)

## 2023-09-02 LAB — TSH: TSH: 1.33 u[IU]/mL (ref 0.450–4.500)

## 2023-09-02 LAB — TREPONEMAL ANTIBODIES, TPPA: Treponemal Antibodies, TPPA: NONREACTIVE

## 2023-09-02 LAB — RPR W/REFLEX TO TREPSURE: RPR: NONREACTIVE

## 2023-09-02 LAB — HEMOGLOBIN A1C
Est. average glucose Bld gHb Est-mCnc: 123 mg/dL
Hgb A1c MFr Bld: 5.9 % — ABNORMAL HIGH (ref 4.8–5.6)

## 2023-09-02 LAB — VITAMIN B12: Vitamin B-12: 622 pg/mL (ref 232–1245)

## 2023-09-02 LAB — SEDIMENTATION RATE: Sed Rate: 13 mm/h (ref 0–40)

## 2023-09-02 LAB — VITAMIN D 25 HYDROXY (VIT D DEFICIENCY, FRACTURES): Vit D, 25-Hydroxy: 28.2 ng/mL — ABNORMAL LOW (ref 30.0–100.0)

## 2023-09-10 ENCOUNTER — Other Ambulatory Visit: Payer: Self-pay

## 2023-09-10 ENCOUNTER — Other Ambulatory Visit: Payer: Self-pay | Admitting: Family

## 2023-09-10 MED ORDER — ASPIRIN 81 MG PO TBEC
81.0000 mg | DELAYED_RELEASE_TABLET | Freq: Every day | ORAL | 1 refills | Status: AC
Start: 2023-09-10 — End: ?
  Filled 2023-09-10: qty 30, 30d supply, fill #0

## 2023-09-10 MED FILL — Gabapentin Cap 100 MG: ORAL | 30 days supply | Qty: 90 | Fill #0 | Status: CN

## 2023-09-11 ENCOUNTER — Other Ambulatory Visit: Payer: Self-pay

## 2023-09-18 NOTE — Progress Notes (Unsigned)
 [name], [name] [address] [place], [place]  [address] Phone: [phone]   Gastroenterology Consultation  Referring Provider:     Miki Kins, FNP Primary Care Physician:  Miki Kins, FNP Primary Gastroenterologist:  [name], [name] / Yancey Flemings, MD  Reason for Consultation:     Abdominal pain, discuss colonoscopy        HPI:   Samantha Banks is a 63 y.o. y/o female referred for consultation & management  by Miki Kins, FNP.    Here to evaluate lower abdominal pain for 6 months.  Is located in the left lower quadrant, suprapubic, and right lower quadrant abdomen.  Her pain occurs every other week and is episodic.  Typically last 5 minutes as a sharp spasm and gradually resolves.  Episodes of lower abdominal pain have become more severe and frequent in the past 3 months.  She denies diarrhea, constipation, melena, hematochezia, or weight loss.  She denies upper abdominal pain, nausea, vomiting, or heartburn.  She reports having a normal bowel movement daily.  Has history of chronic low back pain, left hip and thigh pain.  She had 3 C-sections.  No other abdominal pelvic surgeries.    Last saw Dr. Marina Goodell here 06/2019 to evaluate right flank pain, epigastric pain, and chest pain.  Treated for GERD.  She is no longer having those symptoms.  08/2020 last Abdominal Pelvic CT without contrast (allergic to contrast): Multiple small central mesenteric lymph nodes (enteritis or mesenteric adenitis).  Diverticulosis but no diverticulitis.  No acute abnormality.  12/2016 last Colonoscopy by Dr. Ricki Rodriguez @ [facility]: 1 small 4 mm tubular adenoma and 2 small hyperplastic polyps removed.  Diverticulosis.  7-year repeat colonoscopy due 12/2023.  Last labs 08/29/2023: Normal CBC (hemoglobin 13.1), normal CMP (BUN 13, creatinine 0.61, GFR 102) and TSH.  Normal LFTs.  Low vitamin D.    Past Medical History:  Diagnosis Date   Allergic rhinitis    Arthritis    Atypical  chest pain 01/23/2019   Dyspnea 09/20/2014   Headache    chronic migraines    No pertinent past medical history     Past Surgical History:  Procedure Laterality Date   CESAREAN SECTION  1986   CESAREAN SECTION  1990   CESAREAN SECTION  1991   COLONOSCOPY WITH PROPOFOL N/A 12/17/2016   Procedure: COLONOSCOPY WITH PROPOFOL;  Surgeon: Christena Deem, MD;  Location: [facility] ENDOSCOPY;  Service: Endoscopy;  Laterality: N/A;    Prior to Admission medications   Medication Sig Start Date End Date Taking? Authorizing Provider  atorvastatin (LIPITOR) 40 MG tablet Take 1 tablet (40 mg total) by mouth at bedtime. 02/12/23   Miki Kins, FNP  aspirin EC (ASPIRIN 81) 81 MG tablet Take 1 tablet (81 mg total) by mouth daily. 09/10/23   Miki Kins, FNP  gabapentin (NEURONTIN) 100 MG capsule Take 1 capsule (100 mg total) by mouth 3 (three) times daily. 09/10/23   Miki Kins, FNP  loratadine (CLARITIN) 10 MG tablet TAKE 1 TABLET BY MOUTH EVERY DAY 04/30/23   Miki Kins, FNP  losartan (COZAAR) 25 MG tablet Take 1 tablet (25 mg total) by mouth daily. 03/07/23   Miki Kins, FNP    Family History  Problem Relation Age of Onset   Throat cancer Mother    Osteoarthritis Mother    Hypertension Father    Diabetes Mellitus II Sister    Asthma Sister  Diabetes Daughter    Diabetes Daughter    Ulcers Daughter    Other Neg Hx    Stomach cancer Neg Hx    Pancreatic cancer Neg Hx    Colon cancer Neg Hx      Social History   Tobacco Use   Smoking status: Never   Smokeless tobacco: Never  Vaping Use   Vaping status: Never Used  Substance Use Topics   Alcohol use: No    Alcohol/week: 0.0 standard drinks of alcohol   Drug use: No    Allergies as of 09/19/2023 - Review Complete 09/19/2023  Allergen Reaction Noted   Iodinated contrast media Hives 12/14/2015   Latex Rash and Other (See Comments) 02/02/2012    Review of Systems:    All systems reviewed and negative  except where noted in HPI.   Physical Exam:  BP 110/80   Pulse 83   Ht 4\' 11"  (1.499 m)   Wt 160 lb (72.6 kg)   LMP 02/01/2012   BMI 32.32 kg/m  Patient's last menstrual period was 02/01/2012.  General:   Alert,  Well-developed, well-nourished, pleasant and cooperative in NAD Lungs:  Respirations even and unlabored.  Clear throughout to auscultation.   No wheezes, crackles, or rhonchi. No acute distress. Heart:  Regular rate and rhythm; no murmurs, clicks, rubs, or gallops. Abdomen:  Normal bowel sounds.  No bruits.  Soft, and non-distended without masses, hepatosplenomegaly or hernias noted.  Moderate LLQ, suprapubic, and LUQ tenderness.  Moderate left-sided abdominal tenderness.  There is no right-sided abdominal tenderness.  No RUQ or RLQ tenderness.  No guarding or rebound tenderness.    Neurologic:  Alert and oriented x3;  grossly normal neurologically. Psych:  Alert and cooperative. Normal mood and affect.  Imaging Studies: No results found.  Assessment and Plan:   Samantha Banks is a 63 y.o. y/o female has been referred for:  1.  Lower abdominal pain; Chronic and intermittent; Uncertain etiology.  She had 3 C-sections.  No other abdominal or pelvic surgeries.  Last abdominal pelvic CT from 2022 showed possible mesenteric adenitis.  She has history of diverticulosis.  Moderate LLQ tenderness on exam today.  Rule out diverticulitis.  CT Abd / Pelvis w/o contrast (has contrast allergy).  2.  History of enlarged mesenteric lymph nodes seen on abd / pelvic CT in 2022. Query mesenteric adenitis?  3.  History of one small adenomatous colon polyp  7 year repeat colonoscopy will be due 12/2023.  Follow up 1 month with [name], [name].  Plan to schedule colonoscopy at next OV.  [name], [name]

## 2023-09-19 ENCOUNTER — Ambulatory Visit: Admitting: Physician Assistant

## 2023-09-19 ENCOUNTER — Encounter: Payer: Self-pay | Admitting: Physician Assistant

## 2023-09-19 VITALS — BP 110/80 | HR 83 | Ht 59.0 in | Wt 160.0 lb

## 2023-09-19 DIAGNOSIS — G8929 Other chronic pain: Secondary | ICD-10-CM | POA: Diagnosis not present

## 2023-09-19 DIAGNOSIS — Z860101 Personal history of adenomatous and serrated colon polyps: Secondary | ICD-10-CM | POA: Diagnosis not present

## 2023-09-19 DIAGNOSIS — R102 Pelvic and perineal pain: Secondary | ICD-10-CM

## 2023-09-19 DIAGNOSIS — R935 Abnormal findings on diagnostic imaging of other abdominal regions, including retroperitoneum: Secondary | ICD-10-CM

## 2023-09-19 DIAGNOSIS — Z860100 Personal history of colon polyps, unspecified: Secondary | ICD-10-CM

## 2023-09-19 DIAGNOSIS — R1012 Left upper quadrant pain: Secondary | ICD-10-CM

## 2023-09-19 DIAGNOSIS — R1032 Left lower quadrant pain: Secondary | ICD-10-CM

## 2023-09-19 NOTE — Patient Instructions (Signed)
 You have been scheduled for a CT scan of the abdomen and pelvis at [facility], 1st floor Radiology. You are scheduled on 09/23/2023 at 5:30pm. You should arrive at 3:15 pm to drink the oral contrast.  You may take any medications as prescribed with a small amount of water, if necessary. If you take any of the following medications: METFORMIN, GLUCOPHAGE, GLUCOVANCE, AVANDAMET, RIOMET, FORTAMET, ACTOPLUS MET, JANUMET, GLUMETZA or METAGLIP, you MAY be asked to HOLD this medication 48 hours AFTER the exam.    If you have any questions regarding your exam or if you need to reschedule, you may call [facility] at [phone] between the hours of 8:00 am and 5:00 pm, Monday-Friday.    Please follw up on ______________________

## 2023-09-19 NOTE — Progress Notes (Signed)
 Noted.

## 2023-09-23 ENCOUNTER — Other Ambulatory Visit: Payer: Self-pay

## 2023-09-23 ENCOUNTER — Ambulatory Visit (HOSPITAL_COMMUNITY)
Admission: RE | Admit: 2023-09-23 | Discharge: 2023-09-23 | Disposition: A | Discharge status: Home / Self Care | Source: Ambulatory Visit | Attending: Physician Assistant | Admitting: Physician Assistant

## 2023-09-23 DIAGNOSIS — R1032 Left lower quadrant pain: Secondary | ICD-10-CM | POA: Diagnosis present

## 2023-09-23 DIAGNOSIS — R102 Pelvic and perineal pain: Secondary | ICD-10-CM | POA: Insufficient documentation

## 2023-09-23 DIAGNOSIS — Z860100 Personal history of colon polyps, unspecified: Secondary | ICD-10-CM | POA: Insufficient documentation

## 2023-09-23 DIAGNOSIS — R1012 Left upper quadrant pain: Secondary | ICD-10-CM | POA: Insufficient documentation

## 2023-09-24 ENCOUNTER — Other Ambulatory Visit: Payer: Self-pay

## 2023-10-07 ENCOUNTER — Other Ambulatory Visit: Payer: Self-pay

## 2023-10-14 ENCOUNTER — Encounter (HOSPITAL_COMMUNITY): Payer: Self-pay | Admitting: Emergency Medicine

## 2023-10-14 ENCOUNTER — Ambulatory Visit (HOSPITAL_COMMUNITY)
Admission: EM | Admit: 2023-10-14 | Discharge: 2023-10-14 | Disposition: A | Discharge status: Home / Self Care | Attending: Emergency Medicine | Admitting: Emergency Medicine

## 2023-10-14 ENCOUNTER — Ambulatory Visit (INDEPENDENT_AMBULATORY_CARE_PROVIDER_SITE_OTHER)

## 2023-10-14 DIAGNOSIS — B9789 Other viral agents as the cause of diseases classified elsewhere: Secondary | ICD-10-CM | POA: Diagnosis not present

## 2023-10-14 DIAGNOSIS — R051 Acute cough: Secondary | ICD-10-CM | POA: Diagnosis not present

## 2023-10-14 DIAGNOSIS — R0789 Other chest pain: Secondary | ICD-10-CM

## 2023-10-14 DIAGNOSIS — J988 Other specified respiratory disorders: Secondary | ICD-10-CM | POA: Diagnosis not present

## 2023-10-14 LAB — POC COVID19/FLU A&B COMBO
Covid Antigen, POC: NEGATIVE
Influenza A Antigen, POC: NEGATIVE
Influenza B Antigen, POC: NEGATIVE

## 2023-10-14 MED ORDER — AZELASTINE HCL 0.1 % NA SOLN: 2.0000 | Freq: Two times a day (BID) | NASAL | 0 refills | Status: DC

## 2023-10-14 MED ORDER — PROMETHAZINE-DM 6.25-15 MG/5ML PO SYRP: 5.0000 mL | ORAL_SOLUTION | Freq: Every evening | ORAL | 0 refills | Status: DC | PRN

## 2023-10-14 MED ORDER — BENZONATATE 100 MG PO CAPS: 100.0000 mg | ORAL_CAPSULE | Freq: Three times a day (TID) | ORAL | 0 refills | Status: AC

## 2023-10-14 NOTE — ED Provider Notes (Signed)
 [facility]    CSN: [id number] Arrival date & time: 10/14/23  1308      History   Chief Complaint Chief Complaint  Patient presents with   Chest Pain   Cough    HPI Samantha Banks is a 64 y.o. female.   Patient presents with productive cough with yellow phlegm, congestion, and chills x 3 days.  Patient states that yesterday she began to have persistent chest pain that has not let up.  Patient is concerned she may have pneumonia due to her chest pain.  Denies shortness of breath, known fever, sore throat, headache, vomiting, diarrhea, and abdominal pain.  Patient reports she has been taking NyQuil for her symptoms with minimal relief.  The history is provided by the patient and medical records.  Chest Pain Associated symptoms: cough   Cough Associated symptoms: chest pain     Past Medical History:  Diagnosis Date   Allergic rhinitis    Arthritis    Atypical chest pain 01/23/2019   Dyspnea 09/20/2014   Headache    chronic migraines    No pertinent past medical history     Patient Active Problem List   Diagnosis Date Noted   Flu-like symptoms 07/05/2023   Dizziness and giddiness 01/17/2023   Hyperlipidemia 01/17/2023   Headache    Neural foraminal stenosis of cervical spine 12/26/2022   Right arm pain 12/26/2022   TIA (transient ischemic attack) 12/26/2022   Chronic bilateral low back pain with left-sided sciatica 12/20/2022   DDD (degenerative disc disease), cervical 12/19/2022   Impaired gait and mobility 12/02/2022   Impaired mobility and ADLs 12/02/2022   Hx of migraines 12/01/2022   Radicular pain in right arm 12/01/2022   Peripheral vascular disease (HCC) 08/16/2022   Cervical stenosis of spinal canal 08/16/2022   Pain in joint of left knee 03/21/2022   Memory change 02/29/2020   Arthritis of left knee 01/22/2017   Lumbar radiculopathy 01/05/2016   Paresthesia 01/05/2016   Vocal cord dysfunction 01/05/2016   Chronic migraine without  aura without status migrainosus, not intractable 04/13/2015   Seasonal allergic rhinitis due to pollen 03/30/2015    Past Surgical History:  Procedure Laterality Date   CESAREAN SECTION  1986   CESAREAN SECTION  1990   CESAREAN SECTION  1991   COLONOSCOPY WITH PROPOFOL  N/A 12/17/2016   Procedure: COLONOSCOPY WITH PROPOFOL ;  Surgeon: Deveron Fly, MD;  Location: [facility] ENDOSCOPY;  Service: Endoscopy;  Laterality: N/A;    OB History     Gravida  3   Para  3   Term  3   Preterm  0   AB  0   Living  3      SAB  0   IAB  0   Ectopic  0   Multiple  0   Live Births  3            Home Medications    Prior to Admission medications   Medication Sig Start Date End Date Taking? Authorizing Provider  azelastine (ASTELIN) 0.1 % nasal spray Place 2 sprays into both nostrils 2 (two) times daily. Use in each nostril as directed 10/14/23  Yes Levora Reas A, NP  benzonatate  (TESSALON ) 100 MG capsule Take 1 capsule (100 mg total) by mouth every 8 (eight) hours. 10/14/23  Yes Levora Reas A, NP  promethazine -dextromethorphan (PROMETHAZINE -DM) 6.25-15 MG/5ML syrup Take 5 mLs by mouth at bedtime as needed for cough. 10/14/23  Yes Rosevelt Constable, Harsimran Westman A,  NP  aspirin  EC (ASPIRIN  81) 81 MG tablet Take 1 tablet (81 mg total) by mouth daily. 09/10/23   Trenda Frisk, FNP  atorvastatin  (LIPITOR) 40 MG tablet Take 1 tablet (40 mg total) by mouth at bedtime. 02/12/23   Trenda Frisk, FNP  gabapentin  (NEURONTIN ) 100 MG capsule Take 1 capsule (100 mg total) by mouth 3 (three) times daily. 09/10/23   Trenda Frisk, FNP  loratadine  (CLARITIN ) 10 MG tablet TAKE 1 TABLET BY MOUTH EVERY DAY 04/30/23   Trenda Frisk, FNP  losartan  (COZAAR ) 25 MG tablet Take 1 tablet (25 mg total) by mouth daily. 03/07/23   Trenda Frisk, FNP    Family History Family History  Problem Relation Age of Onset   Throat cancer Mother    Osteoarthritis Mother    Hypertension Father    Diabetes  Mellitus II Sister    Asthma Sister    Diabetes Daughter    Diabetes Daughter    Ulcers Daughter    Other Neg Hx    Stomach cancer Neg Hx    Pancreatic cancer Neg Hx    Colon cancer Neg Hx     Social History Social History   Tobacco Use   Smoking status: Never   Smokeless tobacco: Never  Vaping Use   Vaping status: Never Used  Substance Use Topics   Alcohol use: No    Alcohol/week: 0.0 standard drinks of alcohol   Drug use: No     Allergies   Iodinated contrast media and Latex   Review of Systems Review of Systems  Respiratory:  Positive for cough.   Cardiovascular:  Positive for chest pain.   Per HPI  Physical Exam Triage Vital Signs ED Triage Vitals  Encounter Vitals Group     BP 10/14/23 1418 113/70     Systolic BP Percentile --      Diastolic BP Percentile --      Pulse Rate 10/14/23 1418 84     Resp 10/14/23 1418 17     Temp 10/14/23 1418 98.9 F (37.2 C)     Temp Source 10/14/23 1418 Oral     SpO2 10/14/23 1418 94 %     Weight --      Height --      Head Circumference --      Peak Flow --      Pain Score 10/14/23 1417 7     Pain Loc --      Pain Education --      Exclude from Growth Chart --    No data found.  Updated Vital Signs BP 113/70 (BP Location: Left Arm)   Pulse 84   Temp 98.9 F (37.2 C) (Oral)   Resp 17   LMP 02/01/2012   SpO2 94%   Visual Acuity Right Eye Distance:   Left Eye Distance:   Bilateral Distance:    Right Eye Near:   Left Eye Near:    Bilateral Near:     Physical Exam Vitals and nursing note reviewed.  Constitutional:      General: She is awake. She is not in acute distress.    Appearance: Normal appearance. She is well-developed and well-groomed. She is not ill-appearing.  HENT:     Right Ear: Tympanic membrane, ear canal and external ear normal.     Left Ear: Tympanic membrane, ear canal and external ear normal.     Nose: Congestion and rhinorrhea present.     Mouth/Throat:  Mouth: Mucous  membranes are moist.     Pharynx: Posterior oropharyngeal erythema and postnasal drip present. No oropharyngeal exudate.  Cardiovascular:     Rate and Rhythm: Normal rate and regular rhythm.  Pulmonary:     Effort: Pulmonary effort is normal.     Breath sounds: Normal breath sounds.  Chest:     Chest wall: Tenderness present.     Comments: Tenderness upon palpation to chest wall. Skin:    General: Skin is warm and dry.  Neurological:     Mental Status: She is alert.  Psychiatric:        Behavior: Behavior is cooperative.      UC Treatments / Results  Labs (all labs ordered are listed, but only abnormal results are displayed) Labs Reviewed  POC COVID19/FLU A&B COMBO    EKG   Radiology DG Chest 2 View Result Date: 10/14/2023 CLINICAL DATA:  Chest pain. EXAM: CHEST - 2 VIEW COMPARISON:  11/15/2022. FINDINGS: The heart size and mediastinal contours are within normal limits. No focal consolidation, pleural effusion, or pneumothorax. No acute osseous abnormality. IMPRESSION: No acute cardiopulmonary findings. Electronically Signed   By: Mannie Seek M.D.   On: 10/14/2023 15:43    Procedures Procedures (including critical care time)  Medications Ordered in UC Medications - No data to display  Initial Impression / Assessment and Plan / UC Course  I have reviewed the triage vital signs and the nursing notes.  Pertinent labs & imaging results that were available during my care of the patient were reviewed by me and considered in my medical decision making (see chart for details).     Patient is well-appearing.  Vital stable.  Upon assessment congestion and rhinorrhea are present, mild erythema and PND noted to pharynx.  Lungs clear bilaterally on auscultation.  Heart sounds normal.  Tenderness upon palpation to chest wall noted on exam.  EKG reveals normal sinus rhythm without ST elevation, depression, or acute cardiac findings. Chest x-ray ordered to rule out pneumonia.   Based on my interpretation there is no obvious pneumonia or active cardiopulmonary disease.  Radiology report confirms this.  Prescribed Tessalon  and Promethazine  DM as needed for cough.  Prescribed azelastine nasal spray to help with congestion.  Discussed over-the-counter medication as needed for symptoms.  Discussed return and strict ER precautions. Final Clinical Impressions(s) / UC Diagnoses   Final diagnoses:  Acute cough  Chest wall pain  Viral respiratory illness     Discharge Instructions      Your chest x-ray was negative for pneumonia.  Flu and COVID testing negative. I believe your symptoms are likely related to a viral illness. Take Tessalon  every 8 hours as needed for cough. Take Promethazine  DM cough syrup at bedtime as needed for cough.  This can make you drowsy so do not drive, work, or drink alcohol while taking this Use azelastine nasal spray twice daily to help with congestion. Otherwise you can take over-the-counter Mucinex  for cough and congestion if needed. Take 650 mg of Tylenol  every 4-6 hours as needed for sore throat, headache, chest pain, and body pain. Make sure you are staying hydrated and getting plenty of rest. Return here as needed.     ED Prescriptions     Medication Sig Dispense Auth. Provider   benzonatate  (TESSALON ) 100 MG capsule Take 1 capsule (100 mg total) by mouth every 8 (eight) hours. 21 capsule Levora Reas A, NP   promethazine -dextromethorphan (PROMETHAZINE -DM) 6.25-15 MG/5ML syrup Take 5 mLs by mouth  at bedtime as needed for cough. 118 mL Rosevelt Constable, Eliyanah Elgersma A, NP   azelastine (ASTELIN) 0.1 % nasal spray Place 2 sprays into both nostrils 2 (two) times daily. Use in each nostril as directed 30 mL Levora Reas A, NP      PDMP not reviewed this encounter.   Levora Reas A, NP 10/14/23 [phone]

## 2023-10-14 NOTE — ED Triage Notes (Signed)
 Pt reports generalized chest pain and cough for 3 days. Took Nyquil. Reports getting up yellow phlegm when coughing.

## 2023-10-14 NOTE — Discharge Instructions (Signed)
 Your chest x-ray was negative for pneumonia.  Flu and COVID testing negative. I believe your symptoms are likely related to a viral illness. Take Tessalon  every 8 hours as needed for cough. Take Promethazine  DM cough syrup at bedtime as needed for cough.  This can make you drowsy so do not drive, work, or drink alcohol while taking this Use azelastine nasal spray twice daily to help with congestion. Otherwise you can take over-the-counter Mucinex  for cough and congestion if needed. Take 650 mg of Tylenol  every 4-6 hours as needed for sore throat, headache, chest pain, and body pain. Make sure you are staying hydrated and getting plenty of rest. Return here as needed.

## 2023-10-16 NOTE — Telephone Encounter (Signed)
 Spoke with pt and she is aware of results anr recommendations per Brian Campanile garrett PA

## 2023-10-16 NOTE — Telephone Encounter (Signed)
 Patient is requesting a call to discuss results further. Please advise, thank you

## 2023-10-24 ENCOUNTER — Ambulatory Visit: Admitting: Physician Assistant

## 2023-10-25 ENCOUNTER — Telehealth: Payer: Self-pay

## 2023-10-25 ENCOUNTER — Telehealth: Payer: Self-pay | Admitting: Physician Assistant

## 2023-10-25 ENCOUNTER — Other Ambulatory Visit (HOSPITAL_COMMUNITY): Payer: Self-pay

## 2023-10-25 DIAGNOSIS — K5900 Constipation, unspecified: Secondary | ICD-10-CM

## 2023-10-25 DIAGNOSIS — R1032 Left lower quadrant pain: Secondary | ICD-10-CM

## 2023-10-25 MED ORDER — LINACLOTIDE 145 MCG PO CAPS: 145.0000 ug | ORAL_CAPSULE | Freq: Every day | ORAL | Status: DC

## 2023-10-25 MED ORDER — LINACLOTIDE 145 MCG PO CAPS: 145.0000 ug | ORAL_CAPSULE | Freq: Every day | ORAL | 5 refills | Status: DC

## 2023-10-25 NOTE — Telephone Encounter (Signed)
 Pt returned my call. Pt states she is taking Miralax 2 capfuls daily and getting "plenty of water". I expressed the importance of drinking at least 64 ounces of water. Pt states she is still not having regular bowel movements. Pt states she is having bowel movement every other day described as "little pieces". Pt denies any hematochezia, melena, nausea, vomiting, or fever. Pt states "I do sweat a lot". Pt c/o lower bilateral abdominal pain described as sharp pain. This pain occurs a couple of times weekly when she "lays on my side or back". The pain feels sharp in nature and "feels heavy like a bag of sand". Pt states the pain improves if she changes position or stands up.   Brian Campanile,  Please advise.  Thanks, Brand Siever

## 2023-10-25 NOTE — Addendum Note (Signed)
 Addended by: Tenleigh Byer K on: 10/25/2023 01:44 PM   Modules accepted: Orders

## 2023-10-25 NOTE — Telephone Encounter (Signed)
 Providing samples to pt for Linzess 145 mcg 1 tablet daily. Sent in RX for Linzess to pt's pharmacy. Will need a PA so I will send a message to our PA team to be monitoring for that.   I spoke to pt and informed her that samples of Linzess 145 mcg capsules will be available for her to pick up at our front desk on second floor. Pt verbalized understanding about the directions to take only 1 capsule daily before breakfast. She is also aware that our PA team will need to work on insurance approval and we will keep her informed.

## 2023-10-25 NOTE — Telephone Encounter (Addendum)
 Per recommendations following pelvic CT   Brigitte Canard, [name] to [facility]     10/08/23  5:11 PM Hi Ms. Villafuerte, Your abdominal pelvic CT shows: 1.  Diverticulosis but no diverticulitis. 2.  Moderate stool in the colon, consistent with constipation.  Please start taking OTC MiraLAX powder, mix 1 capful in a drink once or twice daily for constipation.  Drink 64 ounces of water daily. 3.  No acute abnormality to explain lower abdominal pain except for constipation.  No evidence of enlarged lymph nodes.  No masses or worrisome findings. Brigitte Canard, [name]

## 2023-10-25 NOTE — Telephone Encounter (Signed)
 Patient called and sated that she was last her at our office on April the 10 th and was prescribed medication for her problem. Patient is now stated that her medication has not been working and would like to speak to he nurse regarding her medication. Patient is requesting a call back. Please advise.

## 2023-10-25 NOTE — Telephone Encounter (Signed)
 Samantha Banks started pt on Linzess 145 mcg daily. I sent in RX, but with pt's insurance she may need a PA. Just wanted to keep you in the loop.   Thanks,  [employer] LPN

## 2023-10-28 ENCOUNTER — Other Ambulatory Visit (HOSPITAL_COMMUNITY): Payer: Self-pay

## 2023-10-28 ENCOUNTER — Telehealth: Payer: Self-pay

## 2023-10-28 NOTE — Telephone Encounter (Signed)
 From what you're saying it sounds like she needs to contact her current insurance to have them remove the terminated insurance from her profile. With it still showing, her Medicaid will not allow me to process any kind of test claim, and a PA will be denied stating she has alternative insurance.

## 2023-10-28 NOTE — Telephone Encounter (Signed)
 I spoke to pt who states "I have called my insurance who says they have terminated the [employer]" but "when I go to the pharmacy they say it is still there and I don't know what to do". Pt states "I really need my medication". She also said that she was told by her insurance once before to give her pharmacy a code like 01 and when they did that once it went through.

## 2023-10-28 NOTE — Telephone Encounter (Deleted)
 Pharmacy Patient Advocate Encounter   Received notification from Pt Calls Messages that prior authorization for linaclotide  (LINZESS ) 145 MCG CAPS capsules is required/requested.   Insurance verification completed.   The patient is insured through [employer] .   Per test claim: The current 30 day co-pay is, $4.00.  No PA needed at this time. This test claim was processed through [facility]- copay amounts may vary at other pharmacies due to pharmacy/plan contracts, or as the patient moves through the different stages of their insurance plan.

## 2023-10-28 NOTE — Telephone Encounter (Signed)
 Does patient have primary insurance? Her medicaid is stating she does and I will need that info to process a claim. Additionally, if she does not, she will need to contact the insurance and tell them to remove it from her profile

## 2023-10-28 NOTE — Telephone Encounter (Signed)
 The pt was trying to explain the insurance and her pharmacy. Something along the lines that they were showing an insurance in her file, but pt states she no longer has that insurance. I was quite confused. She maybe could explain it to you and it would be clear. I am sorry I couldn't offer any further information.

## 2023-10-28 NOTE — Telephone Encounter (Signed)
 ERROR

## 2023-10-28 NOTE — Telephone Encounter (Signed)
 Pharmacy Patient Advocate Encounter   Received notification from Pt Calls Messages that prior authorization for linaclotide  (LINZESS ) 145 MCG CAPS capsules is required/requested.   Insurance verification completed.   The patient is insured through [employer] .   Per test claim: The current 30 day co-pay is, $4.00.  No PA needed at this time. This test claim was processed through [facility]- copay amounts may vary at other pharmacies due to pharmacy/plan contracts, or as the patient moves through the different stages of their insurance plan.

## 2023-10-28 NOTE — Telephone Encounter (Signed)
 Pt informed and is very pleased with the outcome. Pt states that she is seeing some slight improvement since starting the Linzess  and increasing water intake. Pt encouraged to call back with further questions, concerns, new or worsening symptoms.

## 2023-10-29 ENCOUNTER — Encounter (INDEPENDENT_AMBULATORY_CARE_PROVIDER_SITE_OTHER): Payer: Self-pay

## 2023-11-15 ENCOUNTER — Other Ambulatory Visit: Payer: Self-pay

## 2023-11-15 MED FILL — Gabapentin Cap 100 MG: ORAL | 30 days supply | Qty: 90 | Fill #0 | Status: CN

## 2023-11-25 ENCOUNTER — Other Ambulatory Visit: Payer: Self-pay

## 2023-11-29 ENCOUNTER — Encounter: Payer: Self-pay | Admitting: Family

## 2023-11-29 ENCOUNTER — Ambulatory Visit: Admitting: Family

## 2023-11-29 DIAGNOSIS — G8929 Other chronic pain: Secondary | ICD-10-CM

## 2023-11-29 DIAGNOSIS — M2550 Pain in unspecified joint: Secondary | ICD-10-CM | POA: Diagnosis not present

## 2023-11-29 DIAGNOSIS — R7303 Prediabetes: Secondary | ICD-10-CM | POA: Diagnosis not present

## 2023-11-29 DIAGNOSIS — R202 Paresthesia of skin: Secondary | ICD-10-CM

## 2023-11-29 DIAGNOSIS — E782 Mixed hyperlipidemia: Secondary | ICD-10-CM | POA: Diagnosis not present

## 2023-11-29 DIAGNOSIS — Z0130 Encounter for examination of blood pressure without abnormal findings: Secondary | ICD-10-CM

## 2023-11-29 DIAGNOSIS — E538 Deficiency of other specified B group vitamins: Secondary | ICD-10-CM

## 2023-11-29 DIAGNOSIS — E559 Vitamin D deficiency, unspecified: Secondary | ICD-10-CM

## 2023-11-29 DIAGNOSIS — R519 Headache, unspecified: Secondary | ICD-10-CM

## 2023-11-29 DIAGNOSIS — Z789 Other specified health status: Secondary | ICD-10-CM

## 2023-11-29 DIAGNOSIS — M792 Neuralgia and neuritis, unspecified: Secondary | ICD-10-CM

## 2023-11-29 DIAGNOSIS — R413 Other amnesia: Secondary | ICD-10-CM

## 2023-11-29 DIAGNOSIS — R2689 Other abnormalities of gait and mobility: Secondary | ICD-10-CM

## 2023-11-29 DIAGNOSIS — Z7409 Other reduced mobility: Secondary | ICD-10-CM

## 2023-11-29 DIAGNOSIS — R42 Dizziness and giddiness: Secondary | ICD-10-CM

## 2023-11-29 MED ORDER — IBUPROFEN 600 MG PO TABS: 600.0000 mg | ORAL_TABLET | Freq: Three times a day (TID) | ORAL | 0 refills | Status: AC | PRN

## 2023-12-05 LAB — CMP14+EGFR
ALT: 24 IU/L (ref 0–32)
AST: 24 IU/L (ref 0–40)
Albumin: 4.3 g/dL (ref 3.9–4.9)
Alkaline Phosphatase: 93 IU/L (ref 44–121)
BUN/Creatinine Ratio: 18 (ref 12–28)
BUN: 12 mg/dL (ref 8–27)
Bilirubin Total: <0.2 mg/dL (ref 0.0–1.2)
CO2: 22 mmol/L (ref 20–29)
Calcium: 9.3 mg/dL (ref 8.7–10.3)
Chloride: 103 mmol/L (ref 96–106)
Creatinine, Ser: 0.67 mg/dL (ref 0.57–1.00)
Globulin, Total: 2.4 g/dL (ref 1.5–4.5)
Glucose: 83 mg/dL (ref 70–99)
Potassium: 4.6 mmol/L (ref 3.5–5.2)
Sodium: 141 mmol/L (ref 134–144)
Total Protein: 6.7 g/dL (ref 6.0–8.5)
eGFR: 99 mL/min/{1.73_m2} (ref >59)

## 2023-12-05 LAB — CBC WITH DIFFERENTIAL/PLATELET
Basophils Absolute: 0.1 10*3/uL (ref 0.0–0.2)
Basos: 1 %
EOS (ABSOLUTE): 0.2 10*3/uL (ref 0.0–0.4)
Eos: 3 %
Hematocrit: 44.1 % (ref 34.0–46.6)
Hemoglobin: 14 g/dL (ref 11.1–15.9)
Immature Grans (Abs): 0 10*3/uL (ref 0.0–0.1)
Immature Granulocytes: 0 %
Lymphocytes Absolute: 2.3 10*3/uL (ref 0.7–3.1)
Lymphs: 28 %
MCH: 29.8 pg (ref 26.6–33.0)
MCHC: 31.7 g/dL (ref 31.5–35.7)
MCV: 94 fL (ref 79–97)
Monocytes Absolute: 0.7 10*3/uL (ref 0.1–0.9)
Monocytes: 9 %
Neutrophils Absolute: 5 10*3/uL (ref 1.4–7.0)
Neutrophils: 59 %
Platelets: 235 10*3/uL (ref 150–450)
RBC: 4.7 x10E6/uL (ref 3.77–5.28)
RDW: 13.1 % (ref 11.7–15.4)
WBC: 8.3 10*3/uL (ref 3.4–10.8)

## 2023-12-05 LAB — LIPID PANEL
Chol/HDL Ratio: 4 ratio (ref 0.0–4.4)
Cholesterol, Total: 186 mg/dL (ref 100–199)
HDL: 46 mg/dL (ref >39)
LDL Chol Calc (NIH): 125 mg/dL — ABNORMAL HIGH (ref 0–99)
Triglycerides: 80 mg/dL (ref 0–149)
VLDL Cholesterol Cal: 15 mg/dL (ref 5–40)

## 2023-12-05 LAB — HEMOGLOBIN A1C
Est. average glucose Bld gHb Est-mCnc: 117 mg/dL
Hgb A1c MFr Bld: 5.7 % — ABNORMAL HIGH (ref 4.8–5.6)

## 2023-12-05 LAB — FOLATE RBC

## 2023-12-05 LAB — VITAMIN D 25 HYDROXY (VIT D DEFICIENCY, FRACTURES): Vit D, 25-Hydroxy: 52.3 ng/mL (ref 30.0–100.0)

## 2023-12-05 LAB — VITAMIN B12: Vitamin B-12: 494 pg/mL (ref 232–1245)

## 2023-12-05 LAB — TSH: TSH: 1.38 u[IU]/mL (ref 0.450–4.500)

## 2023-12-12 ENCOUNTER — Ambulatory Visit: Payer: Self-pay

## 2023-12-30 ENCOUNTER — Telehealth: Payer: Self-pay | Admitting: Family

## 2023-12-30 NOTE — Telephone Encounter (Signed)
 Patient left VM wanting to talk to [facility] about both her feet turning purple. Did not leave any further details. Need to call patient and get more info.

## 2024-01-01 NOTE — Telephone Encounter (Signed)
 Bottom of pt's feet were purple and sore and got less purple the following day but is still there. Pt is making an appt to be seen.

## 2024-01-17 ENCOUNTER — Emergency Department (HOSPITAL_BASED_OUTPATIENT_CLINIC_OR_DEPARTMENT_OTHER)
Admission: EM | Admit: 2024-01-17 | Discharge: 2024-01-17 | Disposition: A | Discharge status: Home / Self Care | Attending: Emergency Medicine | Admitting: Emergency Medicine

## 2024-01-17 ENCOUNTER — Encounter (HOSPITAL_BASED_OUTPATIENT_CLINIC_OR_DEPARTMENT_OTHER): Payer: Self-pay

## 2024-01-17 ENCOUNTER — Other Ambulatory Visit: Payer: Self-pay

## 2024-01-17 DIAGNOSIS — R0981 Nasal congestion: Secondary | ICD-10-CM | POA: Diagnosis not present

## 2024-01-17 DIAGNOSIS — R059 Cough, unspecified: Secondary | ICD-10-CM | POA: Diagnosis not present

## 2024-01-17 DIAGNOSIS — Z7982 Long term (current) use of aspirin: Secondary | ICD-10-CM | POA: Diagnosis not present

## 2024-01-17 DIAGNOSIS — R519 Headache, unspecified: Secondary | ICD-10-CM | POA: Diagnosis not present

## 2024-01-17 DIAGNOSIS — R1030 Lower abdominal pain, unspecified: Secondary | ICD-10-CM | POA: Diagnosis present

## 2024-01-17 DIAGNOSIS — M549 Dorsalgia, unspecified: Secondary | ICD-10-CM | POA: Diagnosis not present

## 2024-01-17 DIAGNOSIS — R109 Unspecified abdominal pain: Secondary | ICD-10-CM

## 2024-01-17 LAB — COMPREHENSIVE METABOLIC PANEL WITH GFR
ALT: 22 U/L (ref 0–44)
AST: 25 U/L (ref 15–41)
Albumin: 4 g/dL (ref 3.5–5.0)
Alkaline Phosphatase: 103 U/L (ref 38–126)
Anion gap: 14 (ref 5–15)
BUN: 7 mg/dL — ABNORMAL LOW (ref 8–23)
CO2: 21 mmol/L — ABNORMAL LOW (ref 22–32)
Calcium: 8.9 mg/dL (ref 8.9–10.3)
Chloride: 102 mmol/L (ref 98–111)
Creatinine, Ser: 0.53 mg/dL (ref 0.44–1.00)
GFR, Estimated: >60 mL/min (ref >60)
Glucose, Bld: 81 mg/dL (ref 70–99)
Potassium: 4.1 mmol/L (ref 3.5–5.1)
Sodium: 137 mmol/L (ref 135–145)
Total Bilirubin: 0.4 mg/dL (ref 0.0–1.2)
Total Protein: 6.7 g/dL (ref 6.5–8.1)

## 2024-01-17 LAB — CBC WITH DIFFERENTIAL/PLATELET
Abs Immature Granulocytes: 0.04 K/uL (ref 0.00–0.07)
Basophils Absolute: 0 K/uL (ref 0.0–0.1)
Basophils Relative: 1 %
Eosinophils Absolute: 0.1 K/uL (ref 0.0–0.5)
Eosinophils Relative: 1 %
HCT: 44.2 % (ref 36.0–46.0)
Hemoglobin: 14.4 g/dL (ref 12.0–15.0)
Immature Granulocytes: 1 %
Lymphocytes Relative: 16 %
Lymphs Abs: 1.3 K/uL (ref 0.7–4.0)
MCH: 29.6 pg (ref 26.0–34.0)
MCHC: 32.6 g/dL (ref 30.0–36.0)
MCV: 90.8 fL (ref 80.0–100.0)
Monocytes Absolute: 1.1 K/uL — ABNORMAL HIGH (ref 0.1–1.0)
Monocytes Relative: 14 %
Neutro Abs: 5.5 K/uL (ref 1.7–7.7)
Neutrophils Relative %: 67 %
Platelets: 138 K/uL — ABNORMAL LOW (ref 150–400)
RBC: 4.87 MIL/uL (ref 3.87–5.11)
RDW: 13.5 % (ref 11.5–15.5)
WBC: 8.2 K/uL (ref 4.0–10.5)
nRBC: 0 % (ref 0.0–0.2)

## 2024-01-17 LAB — RESP PANEL BY RT-PCR (RSV, FLU A&B, COVID)  RVPGX2
Influenza A by PCR: NEGATIVE
Influenza B by PCR: NEGATIVE
Resp Syncytial Virus by PCR: NEGATIVE
SARS Coronavirus 2 by RT PCR: NEGATIVE

## 2024-01-17 LAB — LIPASE, BLOOD: Lipase: 16 U/L (ref 11–51)

## 2024-01-17 NOTE — ED Notes (Signed)
 ED Provider at bedside.

## 2024-01-17 NOTE — ED Provider Notes (Signed)
 [place] EMERGENCY DEPARTMENT AT [facility] Provider Note   CSN: [id number] Arrival date & time: 01/17/24  1752     Patient presents with: Cough, Headache, Back Pain, and Abdominal Pain   Samantha Banks is a 63 y.o. female.   63 year old female presents with lower abdominal discomfort for several weeks.  States that she feels as if she has been constipated and has been taking Linzess .  No associated fever or emesis.  Has had some URI symptoms recently as well.  Denies any urinary symptoms.  States that there is no association with food.  Denies any vaginal bleeding or discharge.  Has not had a colonoscopy in the past       Prior to Admission medications   Medication Sig Start Date End Date Taking? Authorizing Provider  aspirin  EC (ASPIRIN  81) 81 MG tablet Take 1 tablet (81 mg total) by mouth daily. 09/10/23   Orlean Alan HERO, FNP  atorvastatin  (LIPITOR) 40 MG tablet Take 1 tablet (40 mg total) by mouth at bedtime. 02/12/23   Orlean Alan HERO, FNP  azelastine  (ASTELIN ) 0.1 % nasal spray Place 2 sprays into both nostrils 2 (two) times daily. Use in each nostril as directed 10/14/23   Johnie Flaming A, NP  benzonatate  (TESSALON ) 100 MG capsule Take 1 capsule (100 mg total) by mouth every 8 (eight) hours. Patient not taking: Reported on 11/29/2023 10/14/23   Johnie Flaming A, NP  gabapentin  (NEURONTIN ) 100 MG capsule Take 1 capsule (100 mg total) by mouth 3 (three) times daily. 09/10/23   Orlean Alan HERO, FNP  ibuprofen  (ADVIL ) 600 MG tablet Take 1 tablet (600 mg total) by mouth every 8 (eight) hours as needed for moderate pain (pain score 4-6) or mild pain (pain score 1-3). 11/29/23   Orlean Alan HERO, FNP  linaclotide  (LINZESS ) 145 MCG CAPS capsule Take 1 capsule (145 mcg total) by mouth daily before breakfast. 10/25/23   [place], [name]  linaclotide  (LINZESS ) 145 MCG CAPS capsule Take 1 capsule (145 mcg total) by mouth daily before breakfast. Patient not taking: Reported  on 11/29/2023 10/25/23   [place], [name]  loratadine  (CLARITIN ) 10 MG tablet TAKE 1 TABLET BY MOUTH EVERY DAY 04/30/23   Orlean Alan HERO, FNP  losartan  (COZAAR ) 25 MG tablet Take 1 tablet (25 mg total) by mouth daily. 03/07/23   Orlean Alan HERO, FNP  promethazine -dextromethorphan (PROMETHAZINE -DM) 6.25-15 MG/5ML syrup Take 5 mLs by mouth at bedtime as needed for cough. Patient not taking: Reported on 11/29/2023 10/14/23   Johnie Flaming A, NP    Allergies: Iodinated contrast media and Latex    Review of Systems  All other systems reviewed and are negative.   Updated Vital Signs BP (!) 145/90   Pulse 74   Temp 98.3 F (36.8 C) (Oral)   Resp 18   Ht 1.499 m (4' 11)   Wt 71.7 kg   LMP 02/01/2012   SpO2 (!) 84%   BMI 31.91 kg/m   Physical Exam Vitals and nursing note reviewed.  Constitutional:      General: She is not in acute distress.    Appearance: Normal appearance. She is well-developed. She is not toxic-appearing.  HENT:     Head: Normocephalic and atraumatic.  Eyes:     General: Lids are normal.     Conjunctiva/sclera: Conjunctivae normal.     Pupils: Pupils are equal, round, and reactive to light.  Neck:     Thyroid : No thyroid  mass.  Trachea: No tracheal deviation.  Cardiovascular:     Rate and Rhythm: Normal rate and regular rhythm.     Heart sounds: Normal heart sounds. No murmur heard.    No gallop.  Pulmonary:     Effort: Pulmonary effort is normal. No respiratory distress.     Breath sounds: Normal breath sounds. No stridor. No decreased breath sounds, wheezing, rhonchi or rales.  Abdominal:     General: There is no distension.     Palpations: Abdomen is soft.     Tenderness: There is no abdominal tenderness. There is no rebound.  Musculoskeletal:        General: No tenderness. Normal range of motion.     Cervical back: Normal range of motion and neck supple.  Skin:    General: Skin is warm and dry.     Findings: No abrasion or rash.   Neurological:     Mental Status: She is alert and oriented to person, place, and time. Mental status is at baseline.     GCS: GCS eye subscore is 4. GCS verbal subscore is 5. GCS motor subscore is 6.     Cranial Nerves: No cranial nerve deficit.     Sensory: No sensory deficit.     Motor: Motor function is intact.  Psychiatric:        Attention and Perception: Attention normal.        Speech: Speech normal.        Behavior: Behavior normal.     (all labs ordered are listed, but only abnormal results are displayed) Labs Reviewed  RESP PANEL BY RT-PCR (RSV, FLU A&B, COVID)  [id number]  CBC WITH DIFFERENTIAL/PLATELET  COMPREHENSIVE METABOLIC PANEL WITH GFR  URINALYSIS, W/ REFLEX TO CULTURE (INFECTION SUSPECTED)  LIPASE, BLOOD    EKG: None  Radiology: No results found.   Procedures   Medications Ordered in the ED - No data to display                                  Medical Decision Making Amount and/or Complexity of Data Reviewed Labs: ordered.   Labs here are reassuring.  The patient's respiratory panel negative.  Abdominal exam is benign.  Patient is on Linzess  for constipation.  She had abdominal CT bleeding after that she needs this given normal labs and vital signs.  Will discharge home and she will follow-up with her GI doctor     Final diagnoses:  None    ED Discharge Orders     None          Dasie Faden, MD 01/17/24 2154

## 2024-01-17 NOTE — ED Triage Notes (Signed)
 Abd pain and back pain onset 2 days ago. Nausea, cough, nasal drainage/congestion, headache. Denies vomiting or diarrhea.

## 2024-01-17 NOTE — ED Notes (Signed)
 Discharge instructions regarding home care of abdominal pain were reviewed. Pt was instructed to eat and drink small frequent amounts, to stay hydrated, to follow up with PCP if not feeling better, and to return with any new or worsening symptoms. Pt verbalized understanding

## 2024-01-20 NOTE — Assessment & Plan Note (Signed)
 Checking labs today.  Continue current therapy for lipid control. Will modify as needed based on labwork results.   -CMP w/eGFR -Lipid Panel

## 2024-01-20 NOTE — Assessment & Plan Note (Signed)
 Patient stable.  Well controlled with current therapy.   Continue current meds.

## 2024-01-20 NOTE — Progress Notes (Signed)
 Established Patient Office Visit  Subjective:  Patient ID: Samantha Banks, female    DOB: March 08, 1961  Age: 63 y.o. MRN: [id number]  Chief Complaint  Patient presents with   Follow-up    3 month follow up    Patient is here today for her 3 months follow up.  She has been feeling fairly well since last appointment.   She does not have additional concerns to discuss today.  Labs are due today.  She needs refills.   I have reviewed her active problem list, medication list, allergies, health maintenance, notes from last encounter, lab results for her appointment today.      No other concerns at this time.   Past Medical History:  Diagnosis Date   Allergic rhinitis    Arthritis    Atypical chest pain 01/23/2019   Dyspnea 09/20/2014   Headache    chronic migraines    No pertinent past medical history     Past Surgical History:  Procedure Laterality Date   CESAREAN SECTION  1986   CESAREAN SECTION  1990   CESAREAN SECTION  1991   COLONOSCOPY WITH PROPOFOL  N/A 12/17/2016   Procedure: COLONOSCOPY WITH PROPOFOL ;  Surgeon: Gaylyn Gladis PENNER, MD;  Location: [facility] ENDOSCOPY;  Service: Endoscopy;  Laterality: N/A;    Social History   Socioeconomic History   Marital status: Widowed    Spouse name: Not on file   Number of children: 3   Years of education: Not on file   Highest education level: Not on file  Occupational History   Occupation: cna  Tobacco Use   Smoking status: Never   Smokeless tobacco: Never  Vaping Use   Vaping status: Never Used  Substance and Sexual Activity   Alcohol use: No    Alcohol/week: 0.0 standard drinks of alcohol   Drug use: No   Sexual activity: Not Currently    Birth control/protection: None  Other Topics Concern   Not on file  Social History Narrative   Not on file   Social Drivers of Health   Financial Resource Strain: High Risk (01/06/2024)   Received from [facility]   Overall Financial Resource  Strain (CARDIA)    Difficulty of Paying Living Expenses: Hard  Food Insecurity: Food Insecurity Present (01/06/2024)   Received from [facility]   Hunger Vital Sign    Within the past 12 months, you worried that your food would run out before you got the money to buy more.: Often true    Within the past 12 months, the food you bought just didn't last and you didn't have money to get more.: Often true  Transportation Needs: No Transportation Needs (01/06/2024)   Received from [facility] - [facility]    In the past 12 months, has lack of transportation kept you from medical appointments or from getting medications?: No    Lack of Transportation (Non-Medical): No  Physical Activity: Not on file  Stress: Not on file  Social Connections: Not on file  Intimate Partner Violence: Not on file    Family History  Problem Relation Age of Onset   Throat cancer Mother    Osteoarthritis Mother    Hypertension Father    Diabetes Mellitus II Sister    Asthma Sister    Diabetes Daughter    Diabetes Daughter    Ulcers Daughter    Other Neg Hx    Stomach cancer Neg Hx  Pancreatic cancer Neg Hx    Colon cancer Neg Hx     Allergies  Allergen Reactions   Iodinated Contrast Media Hives    Pt states hives with previous injection of IV contrast, IV benadryl  given 1 hour prior to injection   Latex Rash and Other (See Comments)    Review of Systems  All other systems reviewed and are negative.      Objective:   BP 126/84   Pulse 70   Ht 4' 11 (1.499 m)   Wt 158 lb (71.7 kg)   LMP 02/01/2012   SpO2 99%   BMI 31.91 kg/m   Vitals:   11/29/23 1504  BP: 126/84  Pulse: 70  Height: 4' 11 (1.499 m)  Weight: 158 lb (71.7 kg)  SpO2: 99%  BMI (Calculated): 31.89    Physical Exam Vitals and nursing note reviewed.  Constitutional:      Appearance: Normal appearance. She is normal weight.  HENT:     Head: Normocephalic.  Eyes:      Extraocular Movements: Extraocular movements intact.     Conjunctiva/sclera: Conjunctivae normal.     Pupils: Pupils are equal, round, and reactive to light.  Cardiovascular:     Rate and Rhythm: Normal rate.  Pulmonary:     Effort: Pulmonary effort is normal.  Neurological:     General: No focal deficit present.     Mental Status: She is alert and oriented to person, place, and time. Mental status is at baseline.  Psychiatric:        Mood and Affect: Mood normal.        Behavior: Behavior normal.        Thought Content: Thought content normal.        Judgment: Judgment normal.      Results for orders placed or performed in visit on 11/29/23  CMP14+EGFR  Result Value Ref Range   Glucose 83 70 - 99 mg/dL   BUN 12 8 - 27 mg/dL   Creatinine, Ser 9.32 0.57 - 1.00 mg/dL   eGFR 99 >40 fO/fpw/8.26   BUN/Creatinine Ratio 18 12 - 28   Sodium 141 134 - 144 mmol/L   Potassium 4.6 3.5 - 5.2 mmol/L   Chloride 103 96 - 106 mmol/L   CO2 22 20 - 29 mmol/L   Calcium  9.3 8.7 - 10.3 mg/dL   Total Protein 6.7 6.0 - 8.5 g/dL   Albumin 4.3 3.9 - 4.9 g/dL   Globulin, Total 2.4 1.5 - 4.5 g/dL   Bilirubin Total <9.7 0.0 - 1.2 mg/dL   Alkaline Phosphatase 93 44 - 121 IU/L   AST 24 0 - 40 IU/L   ALT 24 0 - 32 IU/L  Lipid panel  Result Value Ref Range   Cholesterol, Total 186 100 - 199 mg/dL   Triglycerides 80 0 - 149 mg/dL   HDL 46 >60 mg/dL   VLDL Cholesterol Cal 15 5 - 40 mg/dL   LDL Chol Calc (NIH) 874 (H) 0 - 99 mg/dL   Chol/HDL Ratio 4.0 0.0 - 4.4 ratio  RBC Folate  Result Value Ref Range   Folate, Hemolysate CANCELED ng/mL  VITAMIN D  25 Hydroxy (Vit-D Deficiency, Fractures)  Result Value Ref Range   Vit D, 25-Hydroxy 52.3 30.0 - 100.0 ng/mL  Vitamin B12  Result Value Ref Range   Vitamin B-12 494 232 - 1,245 pg/mL  CBC with Diff  Result Value Ref Range   WBC 8.3 3.4 - 10.8 x10E3/uL  RBC 4.70 3.77 - 5.28 x10E6/uL   Hemoglobin 14.0 11.1 - 15.9 g/dL   Hematocrit 55.8 65.9 - 46.6  %   MCV 94 79 - 97 fL   MCH 29.8 26.6 - 33.0 pg   MCHC 31.7 31.5 - 35.7 g/dL   RDW 86.8 88.2 - 84.5 %   Platelets 235 150 - 450 x10E3/uL   Neutrophils 59 Not Estab. %   Lymphs 28 Not Estab. %   Monocytes 9 Not Estab. %   Eos 3 Not Estab. %   Basos 1 Not Estab. %   Neutrophils Absolute 5.0 1.4 - 7.0 x10E3/uL   Lymphocytes Absolute 2.3 0.7 - 3.1 x10E3/uL   Monocytes Absolute 0.7 0.1 - 0.9 x10E3/uL   EOS (ABSOLUTE) 0.2 0.0 - 0.4 x10E3/uL   Basophils Absolute 0.1 0.0 - 0.2 x10E3/uL   Immature Granulocytes 0 Not Estab. %   Immature Grans (Abs) 0.0 0.0 - 0.1 x10E3/uL  Hemoglobin A1c  Result Value Ref Range   Hgb A1c MFr Bld 5.7 (H) 4.8 - 5.6 %   Est. average glucose Bld gHb Est-mCnc 117 mg/dL  TSH  Result Value Ref Range   TSH 1.380 0.450 - 4.500 uIU/mL    Recent Results (from the past 2160 hours)  CMP14+EGFR     Status: None   Collection Time: 11/29/23  3:45 PM  Result Value Ref Range   Glucose 83 70 - 99 mg/dL   BUN 12 8 - 27 mg/dL   Creatinine, Ser 9.32 0.57 - 1.00 mg/dL   eGFR 99 >40 fO/fpw/8.26   BUN/Creatinine Ratio 18 12 - 28   Sodium 141 134 - 144 mmol/L   Potassium 4.6 3.5 - 5.2 mmol/L   Chloride 103 96 - 106 mmol/L   CO2 22 20 - 29 mmol/L   Calcium  9.3 8.7 - 10.3 mg/dL   Total Protein 6.7 6.0 - 8.5 g/dL   Albumin 4.3 3.9 - 4.9 g/dL   Globulin, Total 2.4 1.5 - 4.5 g/dL   Bilirubin Total <9.7 0.0 - 1.2 mg/dL   Alkaline Phosphatase 93 44 - 121 IU/L   AST 24 0 - 40 IU/L   ALT 24 0 - 32 IU/L  Lipid panel     Status: Abnormal   Collection Time: 11/29/23  3:45 PM  Result Value Ref Range   Cholesterol, Total 186 100 - 199 mg/dL   Triglycerides 80 0 - 149 mg/dL   HDL 46 >60 mg/dL   VLDL Cholesterol Cal 15 5 - 40 mg/dL   LDL Chol Calc (NIH) 874 (H) 0 - 99 mg/dL   Chol/HDL Ratio 4.0 0.0 - 4.4 ratio    Comment:                                   T. Chol/HDL Ratio                                             Men  Women                               1/2 Avg.Risk  3.4     3.3  Avg.Risk  5.0    4.4                                2X Avg.Risk  9.6    7.1                                3X Avg.Risk 23.4   11.0   RBC Folate     Status: None   Collection Time: 11/29/23  3:45 PM  Result Value Ref Range   Folate, Hemolysate CANCELED ng/mL    Comment: [facility] was unable to collect sufficient specimen to perform the following test(s), and is providing the patient with re-collection instructions.  Result canceled by the ancillary.   VITAMIN D  25 Hydroxy (Vit-D Deficiency, Fractures)     Status: None   Collection Time: 11/29/23  3:45 PM  Result Value Ref Range   Vit D, 25-Hydroxy 52.3 30.0 - 100.0 ng/mL    Comment: Vitamin D  deficiency has been defined by the Institute of Medicine and an Endocrine Society practice guideline as a level of serum 25-OH vitamin D  less than 20 ng/mL (1,2). The Endocrine Society went on to further define vitamin D  insufficiency as a level between 21 and 29 ng/mL (2). 1. IOM (Institute of Medicine). 2010. Dietary reference    intakes for calcium  and D. [place]: The    [employer]. 2. Holick MF, Binkley Gower, Bischoff-Ferrari HA, et al.    Evaluation, treatment, and prevention of vitamin D     deficiency: an Endocrine Society clinical practice    guideline. JCEM. 2011 Jul; 96(7):1911-30.   Vitamin B12     Status: None   Collection Time: 11/29/23  3:45 PM  Result Value Ref Range   Vitamin B-12 494 232 - 1,245 pg/mL  CBC with Diff     Status: None   Collection Time: 11/29/23  3:45 PM  Result Value Ref Range   WBC 8.3 3.4 - 10.8 x10E3/uL   RBC 4.70 3.77 - 5.28 x10E6/uL   Hemoglobin 14.0 11.1 - 15.9 g/dL   Hematocrit 55.8 65.9 - 46.6 %   MCV 94 79 - 97 fL   MCH 29.8 26.6 - 33.0 pg   MCHC 31.7 31.5 - 35.7 g/dL   RDW 86.8 88.2 - 84.5 %   Platelets 235 150 - 450 x10E3/uL   Neutrophils 59 Not Estab. %   Lymphs 28 Not Estab. %   Monocytes 9 Not Estab. %   Eos 3 Not Estab. %    Basos 1 Not Estab. %   Neutrophils Absolute 5.0 1.4 - 7.0 x10E3/uL   Lymphocytes Absolute 2.3 0.7 - 3.1 x10E3/uL   Monocytes Absolute 0.7 0.1 - 0.9 x10E3/uL   EOS (ABSOLUTE) 0.2 0.0 - 0.4 x10E3/uL   Basophils Absolute 0.1 0.0 - 0.2 x10E3/uL   Immature Granulocytes 0 Not Estab. %   Immature Grans (Abs) 0.0 0.0 - 0.1 x10E3/uL  Hemoglobin A1c     Status: Abnormal   Collection Time: 11/29/23  3:45 PM  Result Value Ref Range   Hgb A1c MFr Bld 5.7 (H) 4.8 - 5.6 %    Comment:          Prediabetes: 5.7 - 6.4          Diabetes: >6.4          Glycemic control for adults with diabetes: <7.0  Est. average glucose Bld gHb Est-mCnc 117 mg/dL  TSH     Status: None   Collection Time: 11/29/23  3:45 PM  Result Value Ref Range   TSH 1.380 0.450 - 4.500 uIU/mL  Resp panel by RT-PCR (RSV, Flu A&B, Covid) Anterior Nasal Swab     Status: None   Collection Time: 01/17/24  6:12 PM   Specimen: Anterior Nasal Swab  Result Value Ref Range   SARS Coronavirus 2 by RT PCR NEGATIVE NEGATIVE    Comment: (NOTE) SARS-CoV-2 target nucleic acids are NOT DETECTED.  The SARS-CoV-2 RNA is generally detectable in upper respiratory specimens during the acute phase of infection. The lowest concentration of SARS-CoV-2 viral copies this assay can detect is 138 copies/mL. A negative result does not preclude SARS-Cov-2 infection and should not be used as the sole basis for treatment or other patient management decisions. A negative result may occur with  improper specimen collection/handling, submission of specimen other than nasopharyngeal swab, presence of viral mutation(s) within the areas targeted by this assay, and inadequate number of viral copies(<138 copies/mL). A negative result must be combined with clinical observations, patient history, and epidemiological information. The expected result is Negative.  Fact Sheet for Patients:  BloggerCourse.com  Fact Sheet for Healthcare  Providers:  SeriousBroker.it  This test is no t yet approved or cleared by the [place]  FDA and  has been authorized for detection and/or diagnosis of SARS-CoV-2 by FDA under an Emergency Use Authorization (EUA). This EUA will remain  in effect (meaning this test can be used) for the duration of the COVID-19 declaration under Section 564(b)(1) of the Act, 21 U.S.C.section 360bbb-3(b)(1), unless the authorization is terminated  or revoked sooner.       Influenza A by PCR NEGATIVE NEGATIVE   Influenza B by PCR NEGATIVE NEGATIVE    Comment: (NOTE) The Xpert Xpress SARS-CoV-2/FLU/RSV plus assay is intended as an aid in the diagnosis of influenza from Nasopharyngeal swab specimens and should not be used as a sole basis for treatment. Nasal washings and aspirates are unacceptable for Xpert Xpress SARS-CoV-2/FLU/RSV testing.  Fact Sheet for Patients: BloggerCourse.com  Fact Sheet for Healthcare Providers: SeriousBroker.it  This test is not yet approved or cleared by the [place]  FDA and has been authorized for detection and/or diagnosis of SARS-CoV-2 by FDA under an Emergency Use Authorization (EUA). This EUA will remain in effect (meaning this test can be used) for the duration of the COVID-19 declaration under Section 564(b)(1) of the Act, 21 U.S.C. section 360bbb-3(b)(1), unless the authorization is terminated or revoked.     Resp Syncytial Virus by PCR NEGATIVE NEGATIVE    Comment: (NOTE) Fact Sheet for Patients: BloggerCourse.com  Fact Sheet for Healthcare Providers: SeriousBroker.it  This test is not yet approved or cleared by the [place]  FDA and has been authorized for detection and/or diagnosis of SARS-CoV-2 by FDA under an Emergency Use Authorization (EUA). This EUA will remain in effect (meaning this test can be used) for the  duration of the COVID-19 declaration under Section 564(b)(1) of the Act, 21 U.S.C. section 360bbb-3(b)(1), unless the authorization is terminated or revoked.  Performed at [facility], [address] [place], [place] [address]   CBC with Differential/Platelet     Status: Abnormal   Collection Time: 01/17/24  7:58 PM  Result Value Ref Range   WBC 8.2 4.0 - 10.5 K/uL   RBC 4.87 3.87 - 5.11 MIL/uL   Hemoglobin 14.4 12.0 - 15.0 g/dL  HCT 44.2 36.0 - 46.0 %   MCV 90.8 80.0 - 100.0 fL   MCH 29.6 26.0 - 34.0 pg   MCHC 32.6 30.0 - 36.0 g/dL   RDW 86.4 88.4 - 84.4 %   Platelets 138 (L) 150 - 400 K/uL    Comment: SPECIMEN CHECKED FOR CLOTS PLATELET COUNT CONFIRMED BY SMEAR PLATELETS APPEAR DECREASED    nRBC 0.0 0.0 - 0.2 %   Neutrophils Relative % 67 %   Neutro Abs 5.5 1.7 - 7.7 K/uL   Lymphocytes Relative 16 %   Lymphs Abs 1.3 0.7 - 4.0 K/uL   Monocytes Relative 14 %   Monocytes Absolute 1.1 (H) 0.1 - 1.0 K/uL   Eosinophils Relative 1 %   Eosinophils Absolute 0.1 0.0 - 0.5 K/uL   Basophils Relative 1 %   Basophils Absolute 0.0 0.0 - 0.1 K/uL   Immature Granulocytes 1 %   Abs Immature Granulocytes 0.04 0.00 - 0.07 K/uL    Comment: Performed at [facility], [address] [place], [place] [address]  Comprehensive metabolic panel with GFR     Status: Abnormal   Collection Time: 01/17/24  7:58 PM  Result Value Ref Range   Sodium 137 135 - 145 mmol/L   Potassium 4.1 3.5 - 5.1 mmol/L    Comment: Hemolysis at this level may affect result    Chloride 102 98 - 111 mmol/L   CO2 21 (L) 22 - 32 mmol/L   Glucose, Bld 81 70 - 99 mg/dL    Comment: Glucose reference range applies only to samples taken after fasting for at least 8 hours.   BUN 7 (L) 8 - 23 mg/dL   Creatinine, Ser 9.46 0.44 - 1.00 mg/dL   Calcium  8.9 8.9 - 10.3 mg/dL   Total Protein 6.7 6.5 - 8.1 g/dL   Albumin 4.0 3.5 - 5.0 g/dL   AST 25 15 - 41 U/L   ALT 22 0 - 44 U/L    Alkaline Phosphatase 103 38 - 126 U/L   Total Bilirubin 0.4 0.0 - 1.2 mg/dL   GFR, Estimated >39 >39 mL/min    Comment: (NOTE) Calculated using the CKD-EPI Creatinine Equation (2021)    Anion gap 14 5 - 15    Comment: Performed at [facility], [address] [place], [place] [address]  Lipase, blood     Status: None   Collection Time: 01/17/24  7:58 PM  Result Value Ref Range   Lipase 16 11 - 51 U/L    Comment: Performed at [facility], [address], [place], [place] [address]       Assessment & Plan Impaired mobility and ADLs Patient stable.  Well controlled with current therapy.   Continue current meds.   Polyarthralgia Patient stable.  Well controlled with current therapy.   Continue current meds.   Chronic nonintractable headache, unspecified headache type Patient stable.  Well controlled with current therapy.   Continue current meds.   Mixed hyperlipidemia Checking labs today.  Continue current therapy for lipid control. Will modify as needed based on labwork results.   -CMP w/eGFR -Lipid Panel  Prediabetes A1C Continues to be in prediabetic ranges.  Will reassess at follow up after next lab check.  Patient counseled on dietary choices and verbalized understanding.   -CBC w/Diff -CMP w/eGFR -Hemoglobin A1C  Vitamin D  deficiency, unspecified B12 deficiency due to diet Checking labs today.  Will continue supplements as needed.   - Vitamin D  - Vitamin  B12 - TSH     Return in about 3 months (around 02/29/2024).   Total time spent: 20 minutes  Samantha CHRISTELLA ARRANT, FNP  11/29/2023   This document may have been prepared by [facility] Voice Recognition software and as such may include unintentional dictation errors.

## 2024-02-04 NOTE — Assessment & Plan Note (Signed)
 Patient stable.  Well controlled with current therapy.   Continue current meds.

## 2024-02-04 NOTE — Assessment & Plan Note (Signed)
 Checking labs today.  Will continue supplements as needed.   - Vitamin D  - Vitamin B12 - TSH

## 2024-02-04 NOTE — Assessment & Plan Note (Signed)
 Patient stable.  Checking labs today - will call with results.   Well controlled with current therapy.   Continue current meds.

## 2024-02-04 NOTE — Progress Notes (Signed)
 Established Patient Office Visit  Subjective:  Patient ID: Samantha Banks, female    DOB: Oct 21, 1960  Age: 63 y.o. MRN: [id number]  Chief Complaint  Patient presents with   Follow-up    3 month follow up    Patient is here today for her 3 months follow up.  She has been feeling fairly well since last appointment.   She does have additional concerns to discuss today.  She has been continuing to have issues with her memory,her concentration, and her headaches.  She has seen both neurology and neurosurgery, neither of whom have found an issue that explains her symptoms. They have suggested that we need to continue monitoring.   Labs are due today.  She needs refills.   I have reviewed her active problem list, medication list, allergies, health maintenance, notes from last encounter, lab results for her appointment today.     No other concerns at this time.   Past Medical History:  Diagnosis Date   Allergic rhinitis    Arthritis    Atypical chest pain 01/23/2019   Dyspnea 09/20/2014   Headache    chronic migraines    No pertinent past medical history     Past Surgical History:  Procedure Laterality Date   CESAREAN SECTION  1986   CESAREAN SECTION  1990   CESAREAN SECTION  1991   COLONOSCOPY WITH PROPOFOL  N/A 12/17/2016   Procedure: COLONOSCOPY WITH PROPOFOL ;  Surgeon: Gaylyn Gladis PENNER, MD;  Location: [facility] ENDOSCOPY;  Service: Endoscopy;  Laterality: N/A;    Social History   Socioeconomic History   Marital status: Widowed    Spouse name: Not on file   Number of children: 3   Years of education: Not on file   Highest education level: Not on file  Occupational History   Occupation: cna  Tobacco Use   Smoking status: Never   Smokeless tobacco: Never  Vaping Use   Vaping status: Never Used  Substance and Sexual Activity   Alcohol use: No    Alcohol/week: 0.0 standard drinks of alcohol   Drug use: No   Sexual activity: Not Currently    Birth  control/protection: None  Other Topics Concern   Not on file  Social History Narrative   Not on file   Social Drivers of Health   Financial Resource Strain: High Risk (01/06/2024)   Received from [facility]   Overall Financial Resource Strain (CARDIA)    Difficulty of Paying Living Expenses: Hard  Food Insecurity: Food Insecurity Present (01/06/2024)   Received from [facility]   Hunger Vital Sign    Within the past 12 months, you worried that your food would run out before you got the money to buy more.: Often true    Within the past 12 months, the food you bought just didn't last and you didn't have money to get more.: Often true  Transportation Needs: No Transportation Needs (01/06/2024)   Received from [facility] - Transportation    In the past 12 months, has lack of transportation kept you from medical appointments or from getting medications?: No    Lack of Transportation (Non-Medical): No  Physical Activity: Not on file  Stress: Not on file  Social Connections: Not on file  Intimate Partner Violence: Not on file    Family History  Problem Relation Age of Onset   Throat cancer Mother    Osteoarthritis Mother    Hypertension Father  Diabetes Mellitus II Sister    Asthma Sister    Diabetes Daughter    Diabetes Daughter    Ulcers Daughter    Other Neg Hx    Stomach cancer Neg Hx    Pancreatic cancer Neg Hx    Colon cancer Neg Hx     Allergies  Allergen Reactions   Iodinated Contrast Media Hives    Pt states hives with previous injection of IV contrast, IV benadryl  given 1 hour prior to injection   Latex Rash and Other (See Comments)    Review of Systems  Neurological:  Positive for weakness and headaches.  Psychiatric/Behavioral:  Positive for memory loss.   All other systems reviewed and are negative.      Objective:   BP 124/84   Pulse 75   Ht 4' 11 (1.499 m)   Wt 160 lb (72.6 kg)    LMP 02/01/2012   SpO2 99%   BMI 32.32 kg/m   Vitals:   08/29/23 1431  BP: 124/84  Pulse: 75  Height: 4' 11 (1.499 m)  Weight: 160 lb (72.6 kg)  SpO2: 99%  BMI (Calculated): 32.3    Physical Exam Vitals and nursing note reviewed.  Constitutional:      Appearance: Normal appearance. She is normal weight.  HENT:     Head: Normocephalic.  Eyes:     Extraocular Movements: Extraocular movements intact.     Conjunctiva/sclera: Conjunctivae normal.     Pupils: Pupils are equal, round, and reactive to light.  Cardiovascular:     Rate and Rhythm: Normal rate.  Pulmonary:     Effort: Pulmonary effort is normal.  Neurological:     General: No focal deficit present.     Mental Status: She is alert and oriented to person, place, and time. Mental status is at baseline.     Motor: Weakness present.  Psychiatric:        Mood and Affect: Mood normal.        Behavior: Behavior normal.        Thought Content: Thought content normal.        Judgment: Judgment normal.      Results for orders placed or performed in visit on 08/29/23  Treponemal Antibodies, TPPA  Result Value Ref Range   Treponemal Antibodies, TPPA Non Reactive Non Reactive   Interpretation: Comment   Lipid panel  Result Value Ref Range   Cholesterol, Total 110 100 - 199 mg/dL   Triglycerides 55 0 - 149 mg/dL   HDL 42 >60 mg/dL   VLDL Cholesterol Cal 13 5 - 40 mg/dL   LDL Chol Calc (NIH) 55 0 - 99 mg/dL   Chol/HDL Ratio 2.6 0.0 - 4.4 ratio  VITAMIN D  25 Hydroxy (Vit-D Deficiency, Fractures)  Result Value Ref Range   Vit D, 25-Hydroxy 28.2 (L) 30.0 - 100.0 ng/mL  CMP14+EGFR  Result Value Ref Range   Glucose 87 70 - 99 mg/dL   BUN 13 8 - 27 mg/dL   Creatinine, Ser 9.38 0.57 - 1.00 mg/dL   eGFR 897 >40 fO/fpw/8.26   BUN/Creatinine Ratio 21 12 - 28   Sodium 144 134 - 144 mmol/L   Potassium 4.4 3.5 - 5.2 mmol/L   Chloride 105 96 - 106 mmol/L   CO2 21 20 - 29 mmol/L   Calcium  9.1 8.7 - 10.3 mg/dL   Total  Protein 6.4 6.0 - 8.5 g/dL   Albumin 4.5 3.9 - 4.9 g/dL   Globulin, Total 1.9 1.5 -  4.5 g/dL   Bilirubin Total <9.7 0.0 - 1.2 mg/dL   Alkaline Phosphatase 114 44 - 121 IU/L   AST 27 0 - 40 IU/L   ALT 32 0 - 32 IU/L  TSH  Result Value Ref Range   TSH 1.330 0.450 - 4.500 uIU/mL  Hemoglobin A1c  Result Value Ref Range   Hgb A1c MFr Bld 5.9 (H) 4.8 - 5.6 %   Est. average glucose Bld gHb Est-mCnc 123 mg/dL  Vitamin A87  Result Value Ref Range   Vitamin B-12 622 232 - 1,245 pg/mL  CBC with Diff  Result Value Ref Range   WBC 9.7 3.4 - 10.8 x10E3/uL   RBC 4.52 3.77 - 5.28 x10E6/uL   Hemoglobin 13.1 11.1 - 15.9 g/dL   Hematocrit 58.6 65.9 - 46.6 %   MCV 91 79 - 97 fL   MCH 29.0 26.6 - 33.0 pg   MCHC 31.7 31.5 - 35.7 g/dL   RDW 87.3 88.2 - 84.5 %   Platelets 228 150 - 450 x10E3/uL   Neutrophils 62 Not Estab. %   Lymphs 25 Not Estab. %   Monocytes 9 Not Estab. %   Eos 3 Not Estab. %   Basos 1 Not Estab. %   Neutrophils Absolute 6.0 1.4 - 7.0 x10E3/uL   Lymphocytes Absolute 2.4 0.7 - 3.1 x10E3/uL   Monocytes Absolute 0.8 0.1 - 0.9 x10E3/uL   EOS (ABSOLUTE) 0.3 0.0 - 0.4 x10E3/uL   Basophils Absolute 0.1 0.0 - 0.2 x10E3/uL   Immature Granulocytes 0 Not Estab. %   Immature Grans (Abs) 0.0 0.0 - 0.1 x10E3/uL  RBC Folate  Result Value Ref Range   Folate, Hemolysate 381.0 Not Estab. ng/mL   Folate, RBC 923 >498 ng/mL  RPR w/reflex to TrepSure  Result Value Ref Range   RPR Non Reactive Non Reactive  Sed Rate (ESR)  Result Value Ref Range   Sed Rate 13 0 - 40 mm/hr  Rheumatoid Arthritis Profile  Result Value Ref Range   Rheumatoid fact SerPl-aCnc <10.0 <14.0 IU/mL   Cyclic Citrullin Peptide Ab 6 0 - 19 units    Recent Results (from the past 2160 hours)  CMP14+EGFR     Status: None   Collection Time: 11/29/23  3:45 PM  Result Value Ref Range   Glucose 83 70 - 99 mg/dL   BUN 12 8 - 27 mg/dL   Creatinine, Ser 9.32 0.57 - 1.00 mg/dL   eGFR 99 >40 fO/fpw/8.26   BUN/Creatinine  Ratio 18 12 - 28   Sodium 141 134 - 144 mmol/L   Potassium 4.6 3.5 - 5.2 mmol/L   Chloride 103 96 - 106 mmol/L   CO2 22 20 - 29 mmol/L   Calcium  9.3 8.7 - 10.3 mg/dL   Total Protein 6.7 6.0 - 8.5 g/dL   Albumin 4.3 3.9 - 4.9 g/dL   Globulin, Total 2.4 1.5 - 4.5 g/dL   Bilirubin Total <9.7 0.0 - 1.2 mg/dL   Alkaline Phosphatase 93 44 - 121 IU/L   AST 24 0 - 40 IU/L   ALT 24 0 - 32 IU/L  Lipid panel     Status: Abnormal   Collection Time: 11/29/23  3:45 PM  Result Value Ref Range   Cholesterol, Total 186 100 - 199 mg/dL   Triglycerides 80 0 - 149 mg/dL   HDL 46 >60 mg/dL   VLDL Cholesterol Cal 15 5 - 40 mg/dL   LDL Chol Calc (NIH) 874 (H) 0 - 99  mg/dL   Chol/HDL Ratio 4.0 0.0 - 4.4 ratio    Comment:                                   T. Chol/HDL Ratio                                             Men  Women                               1/2 Avg.Risk  3.4    3.3                                   Avg.Risk  5.0    4.4                                2X Avg.Risk  9.6    7.1                                3X Avg.Risk 23.4   11.0   RBC Folate     Status: None   Collection Time: 11/29/23  3:45 PM  Result Value Ref Range   Folate, Hemolysate CANCELED ng/mL    Comment: [facility] was unable to collect sufficient specimen to perform the following test(s), and is providing the patient with re-collection instructions.  Result canceled by the ancillary.   VITAMIN D  25 Hydroxy (Vit-D Deficiency, Fractures)     Status: None   Collection Time: 11/29/23  3:45 PM  Result Value Ref Range   Vit D, 25-Hydroxy 52.3 30.0 - 100.0 ng/mL    Comment: Vitamin D  deficiency has been defined by the Institute of Medicine and an Endocrine Society practice guideline as a level of serum 25-OH vitamin D  less than 20 ng/mL (1,2). The Endocrine Society went on to further define vitamin D  insufficiency as a level between 21 and 29 ng/mL (2). 1. IOM (Institute of Medicine). 2010. Dietary reference    intakes for  calcium  and D. [place]: The    [employer]. 2. Holick MF, [place], Bischoff-Ferrari HA, et al.    Evaluation, treatment, and prevention of vitamin D     deficiency: an Endocrine Society clinical practice    guideline. JCEM. 2011 Jul; 96(7):1911-30.   Vitamin B12     Status: None   Collection Time: 11/29/23  3:45 PM  Result Value Ref Range   Vitamin B-12 494 232 - 1,245 pg/mL  CBC with Diff     Status: None   Collection Time: 11/29/23  3:45 PM  Result Value Ref Range   WBC 8.3 3.4 - 10.8 x10E3/uL   RBC 4.70 3.77 - 5.28 x10E6/uL   Hemoglobin 14.0 11.1 - 15.9 g/dL   Hematocrit 55.8 65.9 - 46.6 %   MCV 94 79 - 97 fL   MCH 29.8 26.6 - 33.0 pg   MCHC 31.7 31.5 - 35.7 g/dL   RDW 86.8 88.2 - 84.5 %   Platelets 235 150 - 450 x10E3/uL   Neutrophils 59 Not Estab. %  Lymphs 28 Not Estab. %   Monocytes 9 Not Estab. %   Eos 3 Not Estab. %   Basos 1 Not Estab. %   Neutrophils Absolute 5.0 1.4 - 7.0 x10E3/uL   Lymphocytes Absolute 2.3 0.7 - 3.1 x10E3/uL   Monocytes Absolute 0.7 0.1 - 0.9 x10E3/uL   EOS (ABSOLUTE) 0.2 0.0 - 0.4 x10E3/uL   Basophils Absolute 0.1 0.0 - 0.2 x10E3/uL   Immature Granulocytes 0 Not Estab. %   Immature Grans (Abs) 0.0 0.0 - 0.1 x10E3/uL  Hemoglobin A1c     Status: Abnormal   Collection Time: 11/29/23  3:45 PM  Result Value Ref Range   Hgb A1c MFr Bld 5.7 (H) 4.8 - 5.6 %    Comment:          Prediabetes: 5.7 - 6.4          Diabetes: >6.4          Glycemic control for adults with diabetes: <7.0    Est. average glucose Bld gHb Est-mCnc 117 mg/dL  TSH     Status: None   Collection Time: 11/29/23  3:45 PM  Result Value Ref Range   TSH 1.380 0.450 - 4.500 uIU/mL  Resp panel by RT-PCR (RSV, Flu A&B, Covid) Anterior Nasal Swab     Status: None   Collection Time: 01/17/24  6:12 PM   Specimen: Anterior Nasal Swab  Result Value Ref Range   SARS Coronavirus 2 by RT PCR NEGATIVE NEGATIVE    Comment: (NOTE) SARS-CoV-2 target nucleic acids are  NOT DETECTED.  The SARS-CoV-2 RNA is generally detectable in upper respiratory specimens during the acute phase of infection. The lowest concentration of SARS-CoV-2 viral copies this assay can detect is 138 copies/mL. A negative result does not preclude SARS-Cov-2 infection and should not be used as the sole basis for treatment or other patient management decisions. A negative result may occur with  improper specimen collection/handling, submission of specimen other than nasopharyngeal swab, presence of viral mutation(s) within the areas targeted by this assay, and inadequate number of viral copies(<138 copies/mL). A negative result must be combined with clinical observations, patient history, and epidemiological information. The expected result is Negative.  Fact Sheet for Patients:  BloggerCourse.com  Fact Sheet for Healthcare Providers:  SeriousBroker.it  This test is no t yet approved or cleared by the [place]  FDA and  has been authorized for detection and/or diagnosis of SARS-CoV-2 by FDA under an Emergency Use Authorization (EUA). This EUA will remain  in effect (meaning this test can be used) for the duration of the COVID-19 declaration under Section 564(b)(1) of the Act, 21 U.S.C.section 360bbb-3(b)(1), unless the authorization is terminated  or revoked sooner.       Influenza A by PCR NEGATIVE NEGATIVE   Influenza B by PCR NEGATIVE NEGATIVE    Comment: (NOTE) The Xpert Xpress SARS-CoV-2/FLU/RSV plus assay is intended as an aid in the diagnosis of influenza from Nasopharyngeal swab specimens and should not be used as a sole basis for treatment. Nasal washings and aspirates are unacceptable for Xpert Xpress SARS-CoV-2/FLU/RSV testing.  Fact Sheet for Patients: BloggerCourse.com  Fact Sheet for Healthcare Providers: SeriousBroker.it  This test is not yet approved  or cleared by the [place]  FDA and has been authorized for detection and/or diagnosis of SARS-CoV-2 by FDA under an Emergency Use Authorization (EUA). This EUA will remain in effect (meaning this test can be used) for the duration of the COVID-19 declaration under Section 564(b)(1) of the  Act, 21 U.S.C. section 360bbb-3(b)(1), unless the authorization is terminated or revoked.     Resp Syncytial Virus by PCR NEGATIVE NEGATIVE    Comment: (NOTE) Fact Sheet for Patients: BloggerCourse.com  Fact Sheet for Healthcare Providers: SeriousBroker.it  This test is not yet approved or cleared by the [place]  FDA and has been authorized for detection and/or diagnosis of SARS-CoV-2 by FDA under an Emergency Use Authorization (EUA). This EUA will remain in effect (meaning this test can be used) for the duration of the COVID-19 declaration under Section 564(b)(1) of the Act, 21 U.S.C. section 360bbb-3(b)(1), unless the authorization is terminated or revoked.  Performed at [facility], [address] [place], [place] [address]   CBC with Differential/Platelet     Status: Abnormal   Collection Time: 01/17/24  7:58 PM  Result Value Ref Range   WBC 8.2 4.0 - 10.5 K/uL   RBC 4.87 3.87 - 5.11 MIL/uL   Hemoglobin 14.4 12.0 - 15.0 g/dL   HCT 55.7 63.9 - 53.9 %   MCV 90.8 80.0 - 100.0 fL   MCH 29.6 26.0 - 34.0 pg   MCHC 32.6 30.0 - 36.0 g/dL   RDW 86.4 88.4 - 84.4 %   Platelets 138 (L) 150 - 400 K/uL    Comment: SPECIMEN CHECKED FOR CLOTS PLATELET COUNT CONFIRMED BY SMEAR PLATELETS APPEAR DECREASED    nRBC 0.0 0.0 - 0.2 %   Neutrophils Relative % 67 %   Neutro Abs 5.5 1.7 - 7.7 K/uL   Lymphocytes Relative 16 %   Lymphs Abs 1.3 0.7 - 4.0 K/uL   Monocytes Relative 14 %   Monocytes Absolute 1.1 (H) 0.1 - 1.0 K/uL   Eosinophils Relative 1 %   Eosinophils Absolute 0.1 0.0 - 0.5 K/uL   Basophils Relative 1 %    Basophils Absolute 0.0 0.0 - 0.1 K/uL   Immature Granulocytes 1 %   Abs Immature Granulocytes 0.04 0.00 - 0.07 K/uL    Comment: Performed at [facility], [address], [place], [place] [address]  Comprehensive metabolic panel with GFR     Status: Abnormal   Collection Time: 01/17/24  7:58 PM  Result Value Ref Range   Sodium 137 135 - 145 mmol/L   Potassium 4.1 3.5 - 5.1 mmol/L    Comment: Hemolysis at this level may affect result    Chloride 102 98 - 111 mmol/L   CO2 21 (L) 22 - 32 mmol/L   Glucose, Bld 81 70 - 99 mg/dL    Comment: Glucose reference range applies only to samples taken after fasting for at least 8 hours.   BUN 7 (L) 8 - 23 mg/dL   Creatinine, Ser 9.46 0.44 - 1.00 mg/dL   Calcium  8.9 8.9 - 10.3 mg/dL   Total Protein 6.7 6.5 - 8.1 g/dL   Albumin 4.0 3.5 - 5.0 g/dL   AST 25 15 - 41 U/L   ALT 22 0 - 44 U/L   Alkaline Phosphatase 103 38 - 126 U/L   Total Bilirubin 0.4 0.0 - 1.2 mg/dL   GFR, Estimated >39 >39 mL/min    Comment: (NOTE) Calculated using the CKD-EPI Creatinine Equation (2021)    Anion gap 14 5 - 15    Comment: Performed at [facility], [address] [place], [place] [address]  Lipase, blood     Status: None   Collection Time: 01/17/24  7:58 PM  Result Value Ref Range   Lipase 16 11 -  51 U/L    Comment: Performed at [facility], [address], [place], [place] [address]       Assessment & Plan Dizziness and giddiness Impaired gait and mobility Impaired mobility and ADLs Radicular pain in right arm Polyarthralgia Paresthesia Patient stable.  Checking labs today - will call with results.   Well controlled with current therapy.   Continue current meds.    Chronic nonintractable headache, unspecified headache type Patient stable.  Well controlled with current therapy.   Continue current meds.   Mixed hyperlipidemia Checking labs today.  Continue current therapy for lipid  control. Will modify as needed based on labwork results.   -CMP w/eGFR -Lipid Panel  Prediabetes A1C Continues to be in prediabetic ranges.  Will reassess at follow up after next lab check.  Patient counseled on dietary choices and verbalized understanding.   -CBC w/Diff -CMP w/eGFR -Hemoglobin A1C  Vitamin D  deficiency, unspecified B12 deficiency due to diet Memory change Checking labs today.  Will continue supplements as needed.   - Vitamin D  - Vitamin B12 - TSH     Return in about 3 months (around 11/29/2023) for F/U.   Total time spent: 20 minutes  ALAN CHRISTELLA ARRANT, FNP  08/29/2023   This document may have been prepared by [place] Voice Recognition software and as such may include unintentional dictation errors.

## 2024-02-04 NOTE — Assessment & Plan Note (Signed)
 Checking labs today.  Continue current therapy for lipid control. Will modify as needed based on labwork results.   -CMP w/eGFR -Lipid Panel

## 2024-02-13 ENCOUNTER — Ambulatory Visit: Admitting: Physician Assistant

## 2024-02-15 IMAGING — CR DG CHEST 2V
2 series · 2 of 2 positions shown · non-contrast
Comparison: 01/26/2021

CLINICAL DATA: Chest achiness for 1 month, headache, intermittent
shortness of breath

EXAM:
CHEST - 2 VIEW

[chest pa]
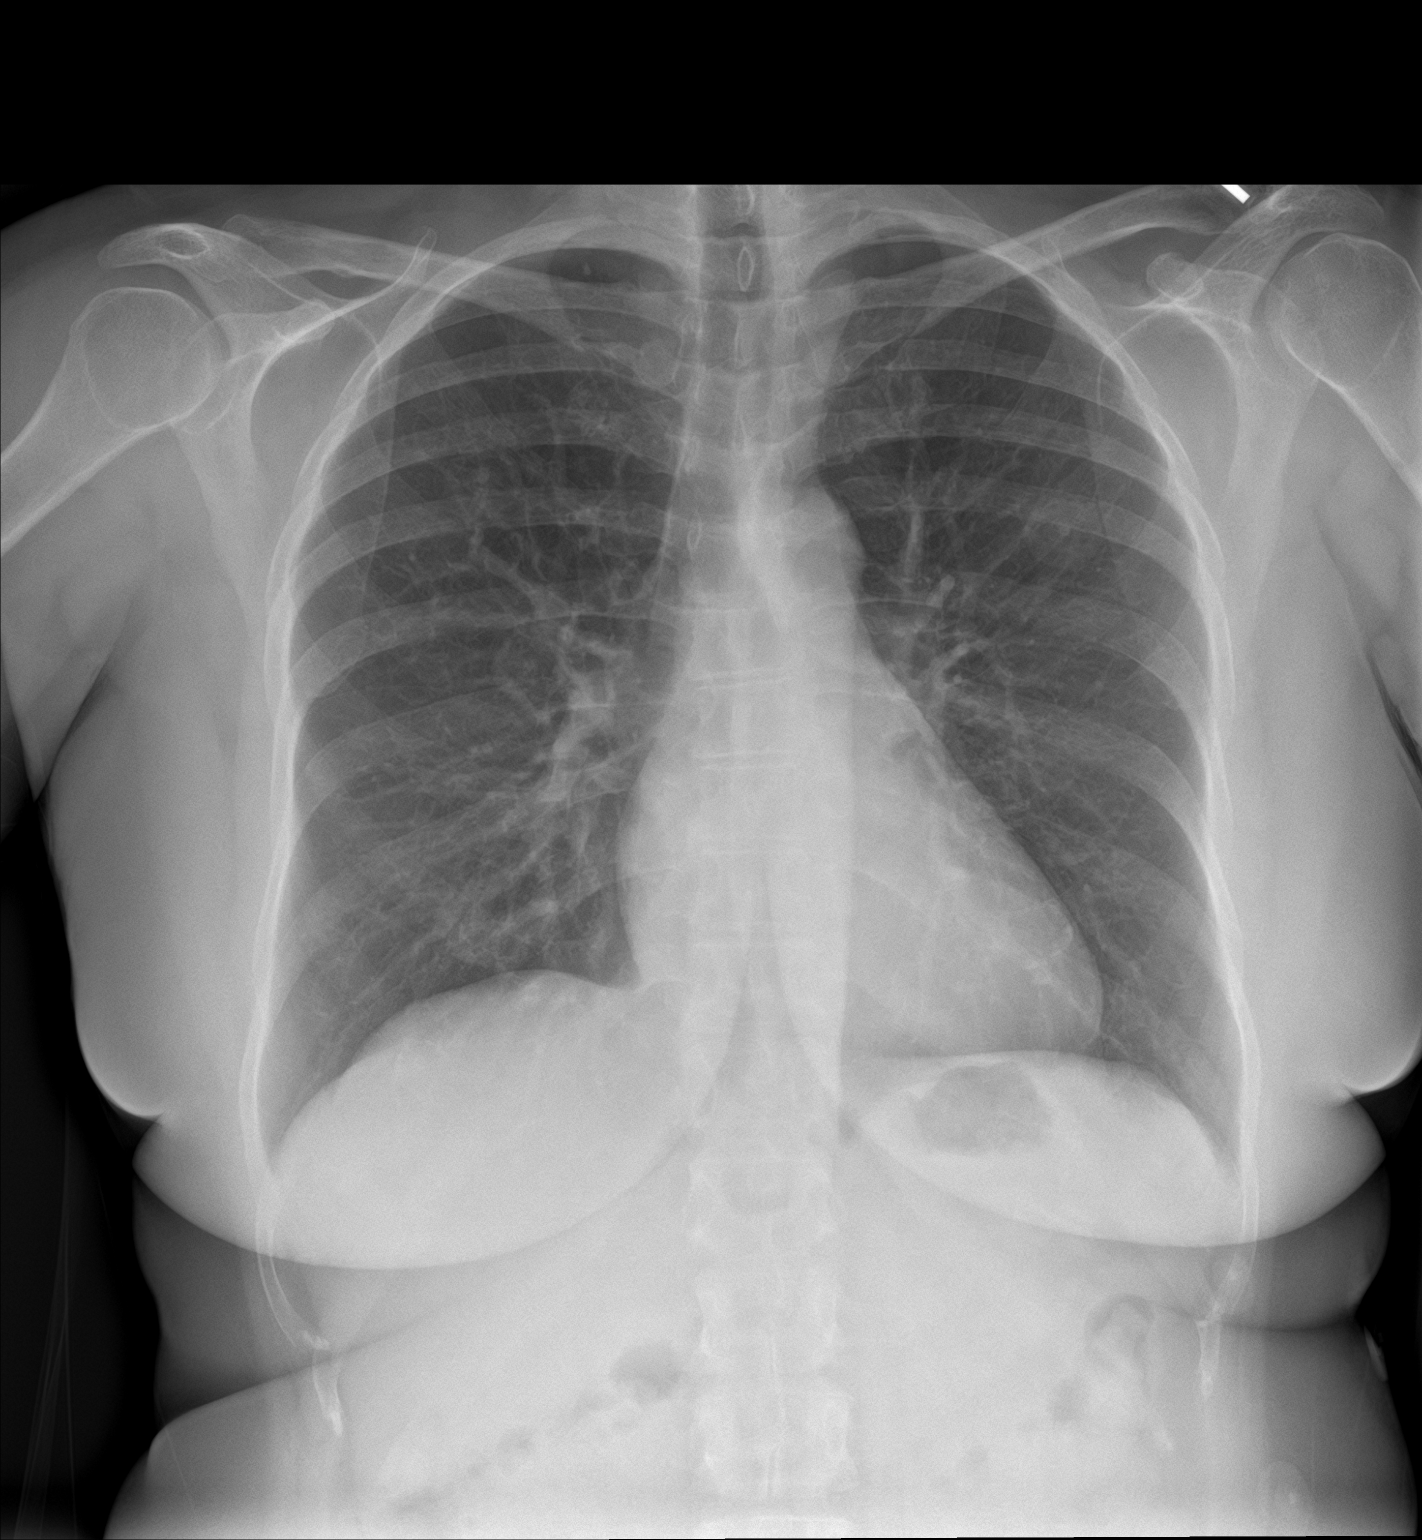

[chest lat]
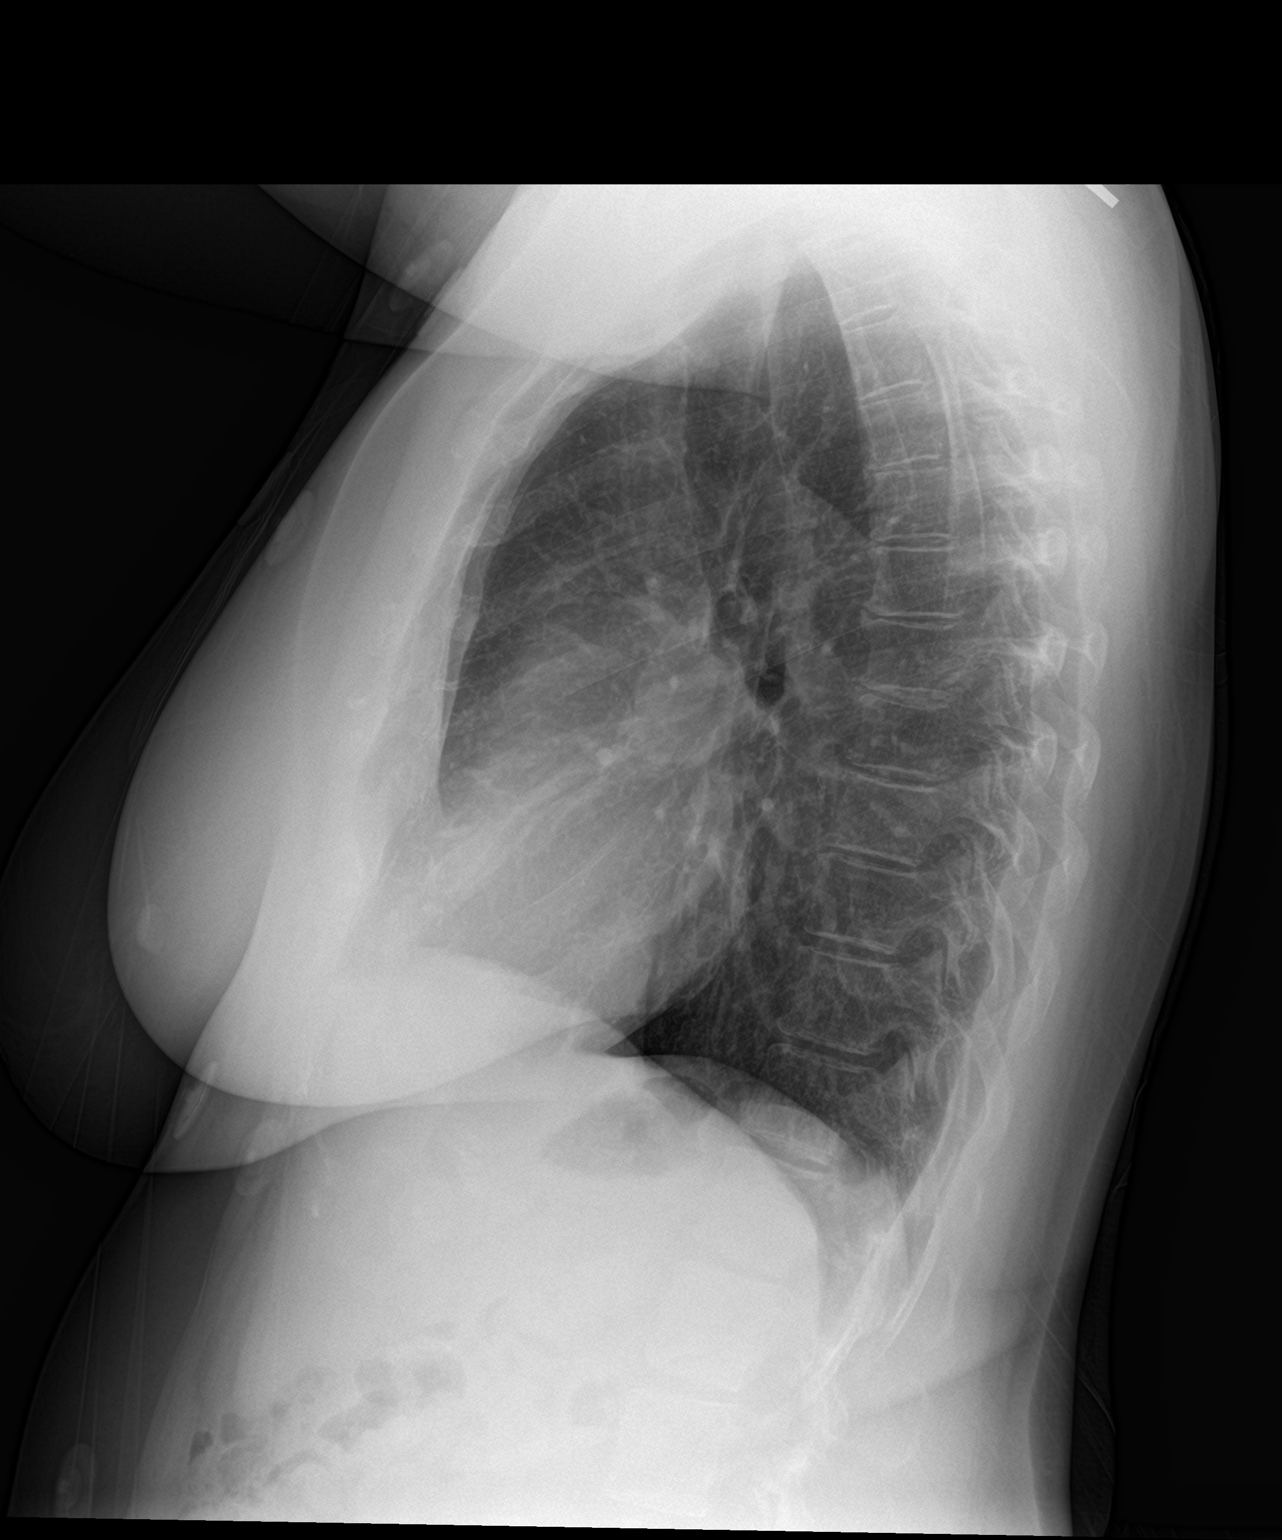

[2 of 2 positions shown; findings below may reference images not displayed]

FINDINGS: Normal heart size, mediastinal contours, and pulmonary vascularity.

Lungs clear.

No pleural effusion or pneumothorax.

Bones unremarkable.
IMPRESSION: Normal exam.

## 2024-02-15 IMAGING — US US EXTREM LOW VENOUS*L*
1 series · 14 of 24 positions shown · non-contrast
Comparison: None.

CLINICAL DATA: LEFT leg pain.

EXAM:
LEFT LOWER EXTREMITY VENOUS DOPPLER ULTRASOUND
TECHNIQUE: Gray-scale sonography with compression, as well as color and duplex
ultrasound, were performed to evaluate the deep venous system(s)
from the level of the common femoral vein through the popliteal and
proximal calf veins.

[Series 1: us venous img lower uni left (dvt) · portal-venous · 14 of 32 slices shown]
[im 1/32]
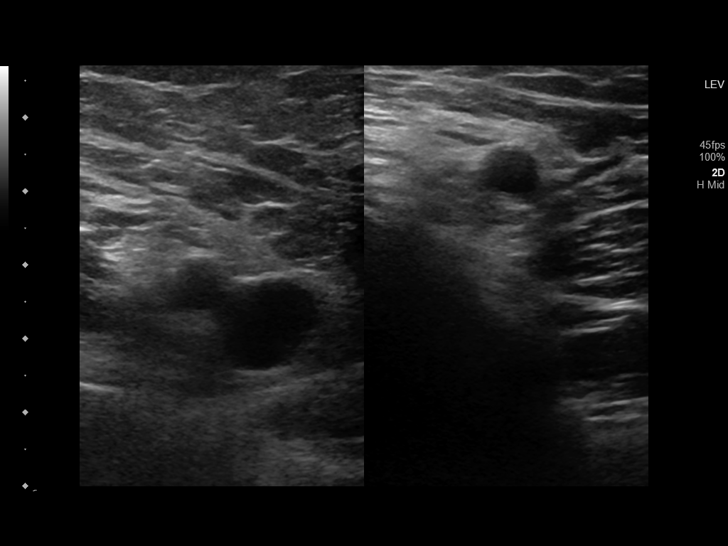
[im 3/32]
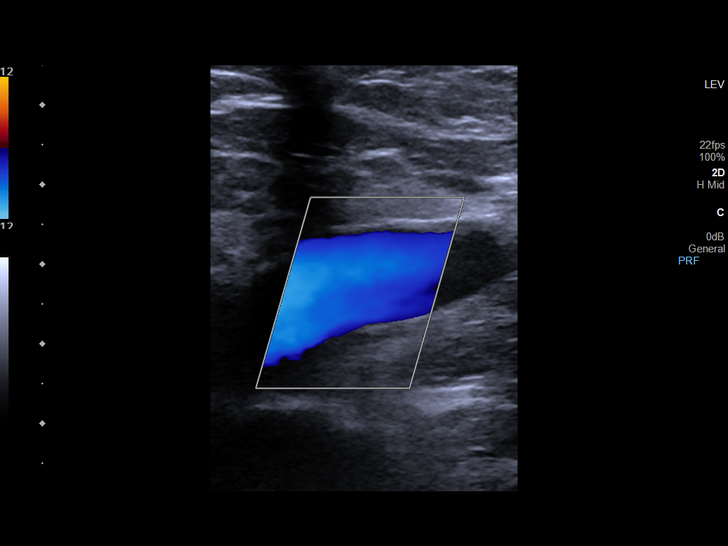
[im 6/32]
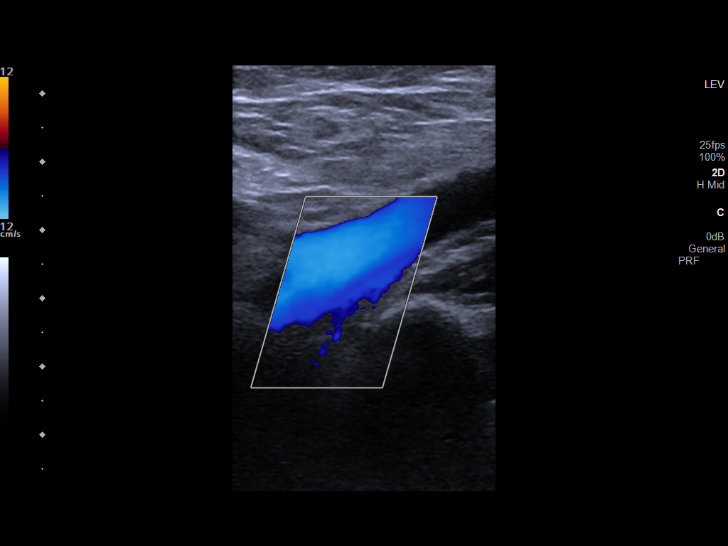
[im 9/32]
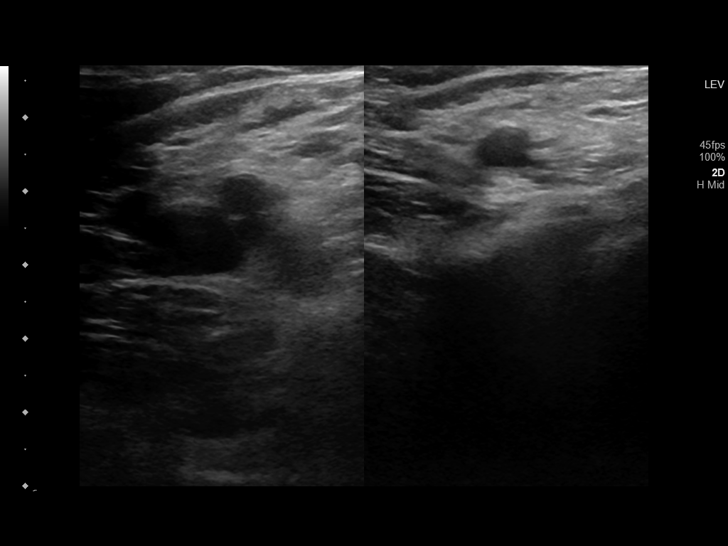
[im 10/32]
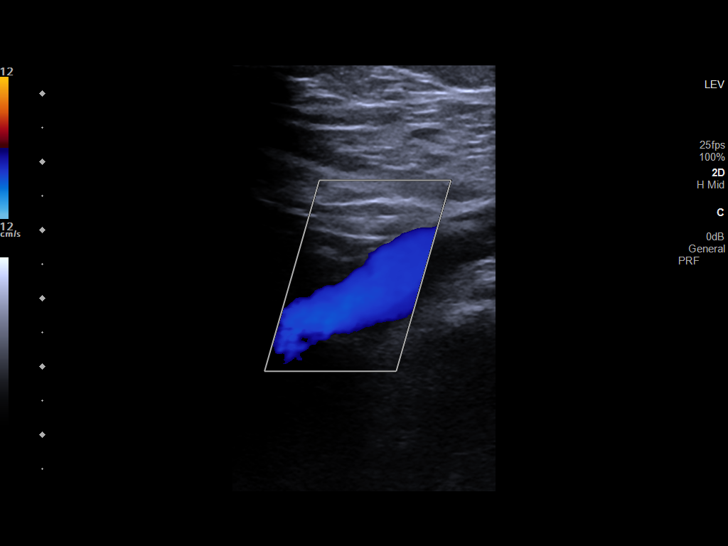
[im 13/32]
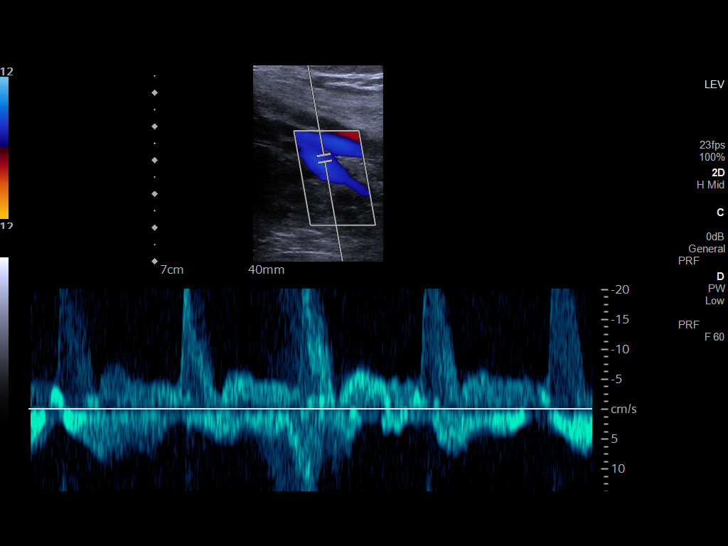
[im 15/32]
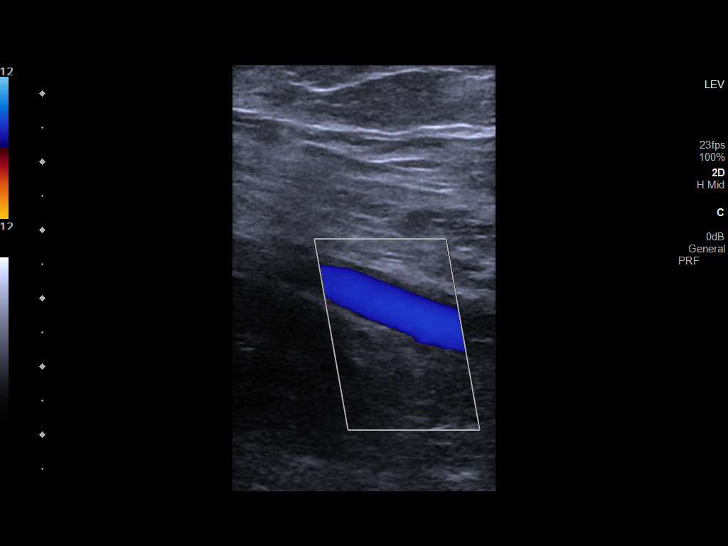
[im 17/32]
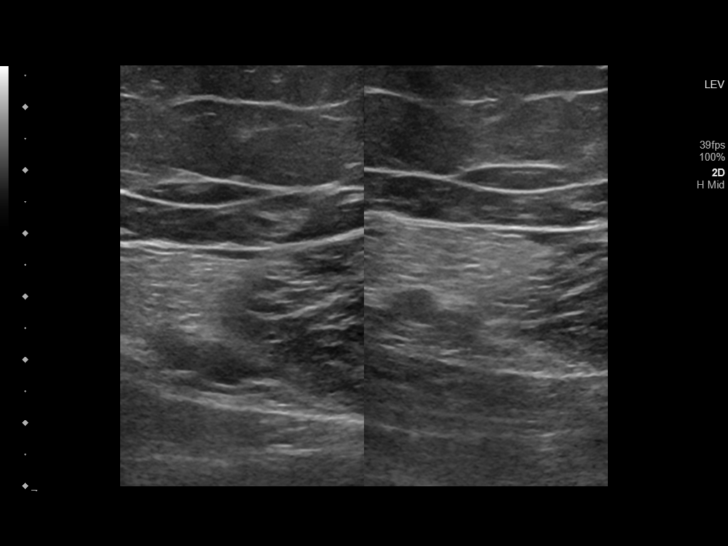
[im 19/32]
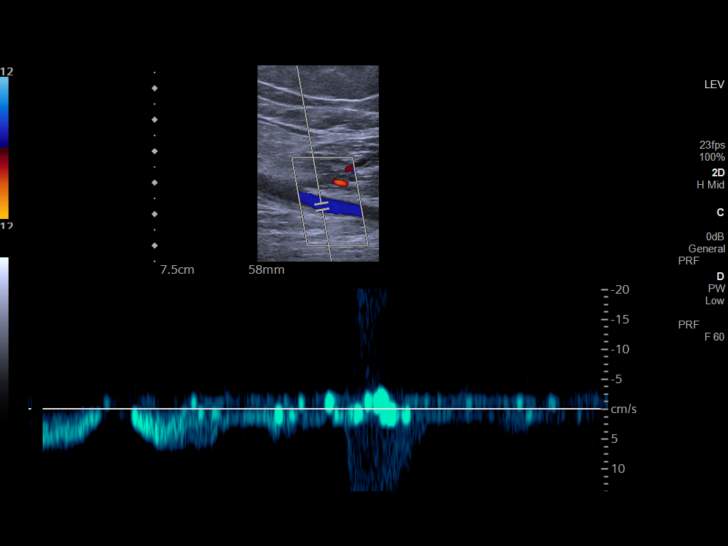
[im 22/32]
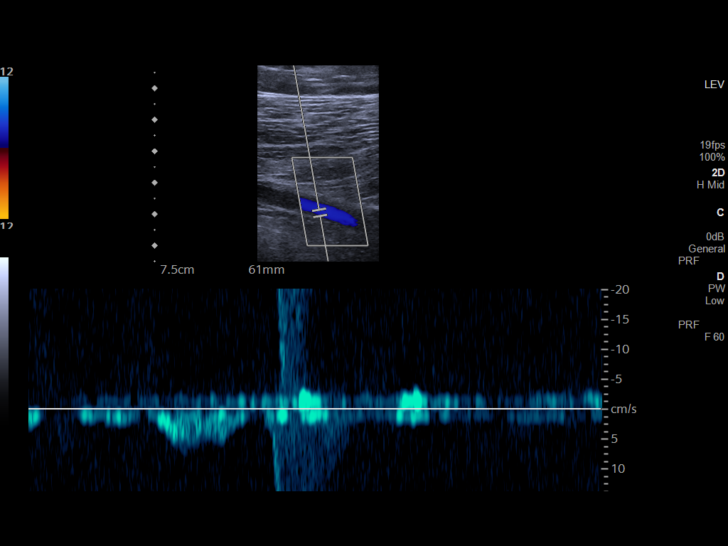
[im 25/32]
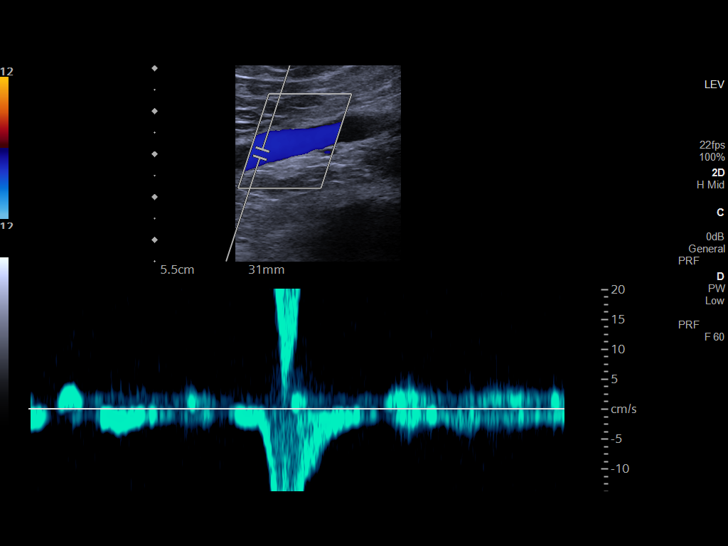
[im 26/32]
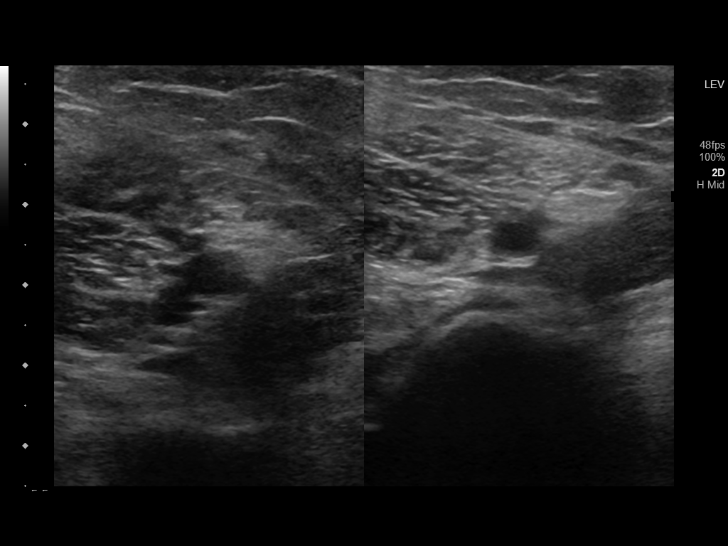
[im 29/32]
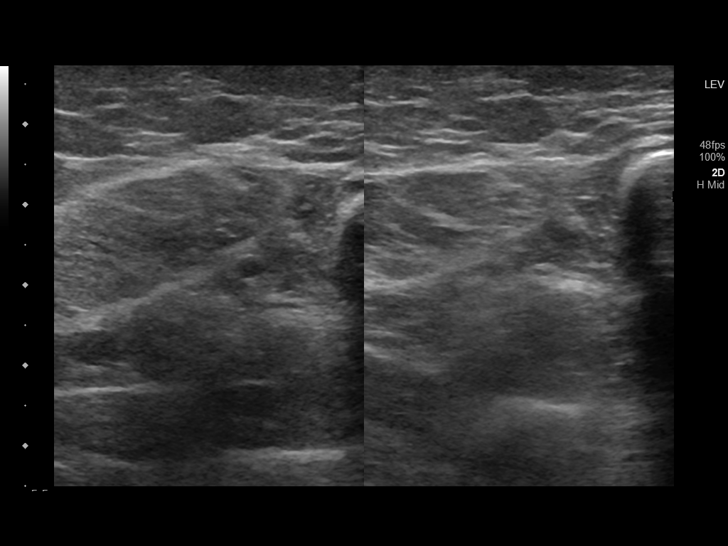
[im 32/32]
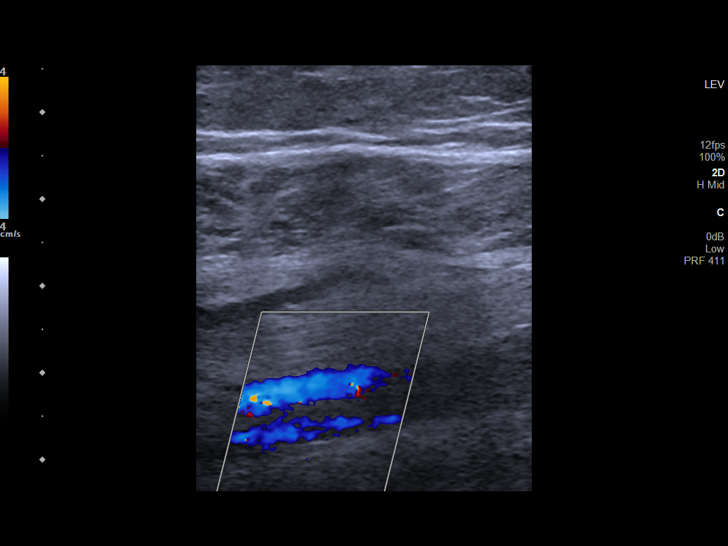

[14 of 24 positions shown; findings below may reference images not displayed]

FINDINGS: VENOUS

Normal compressibility of the LEFT common femoral, superficial
femoral, and popliteal veins, as well as the visualized calf veins.
Visualized portions of profunda femoral vein and great saphenous
vein unremarkable. No filling defects to suggest DVT on grayscale or
color Doppler imaging. Doppler waveforms show normal direction of
venous flow, normal respiratory plasticity and response to
augmentation.

Limited views of the contralateral common femoral vein are
unremarkable.

OTHER

No evidence of superficial thrombophlebitis or abnormal fluid
collection.

Limitations: none
IMPRESSION: No evidence of femoropopliteal DVT within the LEFT lower extremity.

## 2024-03-02 ENCOUNTER — Encounter: Payer: Self-pay | Admitting: Family

## 2024-03-02 ENCOUNTER — Ambulatory Visit: Admitting: Family

## 2024-03-02 DIAGNOSIS — E782 Mixed hyperlipidemia: Secondary | ICD-10-CM

## 2024-03-02 DIAGNOSIS — Z7409 Other reduced mobility: Secondary | ICD-10-CM

## 2024-03-02 DIAGNOSIS — R519 Headache, unspecified: Secondary | ICD-10-CM | POA: Diagnosis not present

## 2024-03-02 DIAGNOSIS — G8929 Other chronic pain: Secondary | ICD-10-CM

## 2024-03-02 DIAGNOSIS — R7303 Prediabetes: Secondary | ICD-10-CM | POA: Diagnosis not present

## 2024-03-02 DIAGNOSIS — M2550 Pain in unspecified joint: Secondary | ICD-10-CM

## 2024-03-02 DIAGNOSIS — E559 Vitamin D deficiency, unspecified: Secondary | ICD-10-CM

## 2024-03-02 DIAGNOSIS — E538 Deficiency of other specified B group vitamins: Secondary | ICD-10-CM

## 2024-03-02 NOTE — Progress Notes (Signed)
 Established Patient Office Visit  Subjective:  Patient ID: Samantha Banks, female    DOB: [birth year]  Age: 63 y.o. MRN: [id number]  Chief Complaint  Patient presents with   Follow-up    3 month follow up    Patient is here today for her 3 months follow up.  She has been feeling fairly well since last appointment.   She does not have additional concerns to discuss today.  She has been to see Neuro, and has been set up with PMR and ortho as well.   Labs are due today.  She needs refills.   I have reviewed her active problem list, medication list, allergies, notes from last encounter, lab results for her appointment today.    No other concerns at this time.   Past Medical History:  Diagnosis Date   Allergic rhinitis    Arthritis    Atypical chest pain 01/23/2019   Dyspnea 09/20/2014   Headache    chronic migraines    No pertinent past medical history     Past Surgical History:  Procedure Laterality Date   CESAREAN SECTION  1986   CESAREAN SECTION  1990   CESAREAN SECTION  1991   COLONOSCOPY WITH PROPOFOL  N/A 12/17/2016   Procedure: COLONOSCOPY WITH PROPOFOL ;  Surgeon: Gaylyn Gladis PENNER, MD;  Location: [facility] ENDOSCOPY;  Service: Endoscopy;  Laterality: N/A;    Social History   Socioeconomic History   Marital status: Widowed    Spouse name: Not on file   Number of children: 3   Years of education: Not on file   Highest education level: Not on file  Occupational History   Occupation: cna  Tobacco Use   Smoking status: Never   Smokeless tobacco: Never  Vaping Use   Vaping status: Never Used  Substance and Sexual Activity   Alcohol use: No    Alcohol/week: 0.0 standard drinks of alcohol   Drug use: No   Sexual activity: Not Currently    Birth control/protection: None  Other Topics Concern   Not on file  Social History Narrative   Not on file   Social Drivers of Health   Financial Resource Strain: High Risk (01/06/2024)   Received from [facility]   Overall Financial Resource Strain (CARDIA)    Difficulty of Paying Living Expenses: Hard  Food Insecurity: Food Insecurity Present (01/06/2024)   Received from [facility]   Hunger Vital Sign    Within the past 12 months, you worried that your food would run out before you got the money to buy more.: Often true    Within the past 12 months, the food you bought just didn't last and you didn't have money to get more.: Often true  Transportation Needs: No Transportation Needs (01/06/2024)   Received from [facility] - Transportation    In the past 12 months, has lack of transportation kept you from medical appointments or from getting medications?: No    Lack of Transportation (Non-Medical): No  Physical Activity: Not on file  Stress: Not on file  Social Connections: Not on file  Intimate Partner Violence: Not on file    Family History  Problem Relation Age of Onset   Throat cancer Mother    Osteoarthritis Mother    Hypertension Father    Diabetes Mellitus II Sister    Asthma Sister    Diabetes Daughter    Diabetes Daughter    Ulcers Daughter  Other Neg Hx    Stomach cancer Neg Hx    Pancreatic cancer Neg Hx    Colon cancer Neg Hx     Allergies  Allergen Reactions   Iodinated Contrast Media Hives    Pt states hives with previous injection of IV contrast, IV benadryl  given 1 hour prior to injection   Latex Rash and Other (See Comments)    Review of Systems  All other systems reviewed and are negative.      Objective:   BP 126/82   Pulse 75   Ht 4' 11 (1.499 m)   Wt 157 lb 3.2 oz (71.3 kg)   LMP 02/01/2012   SpO2 99%   BMI 31.75 kg/m   Vitals:   03/02/24 1431  BP: 126/82  Pulse: 75  Height: 4' 11 (1.499 m)  Weight: 157 lb 3.2 oz (71.3 kg)  SpO2: 99%  BMI (Calculated): 31.73    Physical Exam Vitals and nursing note reviewed.  Constitutional:      General: She is awake.      Appearance: Normal appearance. She is well-developed, well-groomed and overweight.  HENT:     Head: Normocephalic and atraumatic.  Eyes:     Extraocular Movements: Extraocular movements intact.     Conjunctiva/sclera: Conjunctivae normal.     Pupils: Pupils are equal, round, and reactive to light.  Cardiovascular:     Rate and Rhythm: Normal rate.     Pulses: Normal pulses.  Pulmonary:     Effort: Pulmonary effort is normal.  Neurological:     General: No focal deficit present.     Mental Status: She is alert and oriented to person, place, and time. Mental status is at baseline.  Psychiatric:        Mood and Affect: Mood normal.        Behavior: Behavior normal. Behavior is cooperative.        Thought Content: Thought content normal.        Judgment: Judgment normal.      No results found for any visits on 03/02/24.  Recent Results (from the past 2160 hours)  Resp panel by RT-PCR (RSV, Flu A&B, Covid) Anterior Nasal Swab     Status: None   Collection Time: 01/17/24  6:12 PM   Specimen: Anterior Nasal Swab  Result Value Ref Range   SARS Coronavirus 2 by RT PCR NEGATIVE NEGATIVE    Comment: (NOTE) SARS-CoV-2 target nucleic acids are NOT DETECTED.  The SARS-CoV-2 RNA is generally detectable in upper respiratory specimens during the acute phase of infection. The lowest concentration of SARS-CoV-2 viral copies this assay can detect is 138 copies/mL. A negative result does not preclude SARS-Cov-2 infection and should not be used as the sole basis for treatment or other patient management decisions. A negative result may occur with  improper specimen collection/handling, submission of specimen other than nasopharyngeal swab, presence of viral mutation(s) within the areas targeted by this assay, and inadequate number of viral copies(<138 copies/mL). A negative result must be combined with clinical observations, patient history, and epidemiological information. The expected  result is Negative.  Fact Sheet for Patients:  BloggerCourse.com  Fact Sheet for Healthcare Providers:  SeriousBroker.it  This test is no t yet approved or cleared by the [place]  FDA and  has been authorized for detection and/or diagnosis of SARS-CoV-2 by FDA under an Emergency Use Authorization (EUA). This EUA will remain  in effect (meaning this test can be used) for the duration of the  COVID-19 declaration under Section 564(b)(1) of the Act, 21 U.S.C.section 360bbb-3(b)(1), unless the authorization is terminated  or revoked sooner.       Influenza A by PCR NEGATIVE NEGATIVE   Influenza B by PCR NEGATIVE NEGATIVE    Comment: (NOTE) The Xpert Xpress SARS-CoV-2/FLU/RSV plus assay is intended as an aid in the diagnosis of influenza from Nasopharyngeal swab specimens and should not be used as a sole basis for treatment. Nasal washings and aspirates are unacceptable for Xpert Xpress SARS-CoV-2/FLU/RSV testing.  Fact Sheet for Patients: BloggerCourse.com  Fact Sheet for Healthcare Providers: SeriousBroker.it  This test is not yet approved or cleared by the [place]  FDA and has been authorized for detection and/or diagnosis of SARS-CoV-2 by FDA under an Emergency Use Authorization (EUA). This EUA will remain in effect (meaning this test can be used) for the duration of the COVID-19 declaration under Section 564(b)(1) of the Act, 21 U.S.C. section 360bbb-3(b)(1), unless the authorization is terminated or revoked.     Resp Syncytial Virus by PCR NEGATIVE NEGATIVE    Comment: (NOTE) Fact Sheet for Patients: BloggerCourse.com  Fact Sheet for Healthcare Providers: SeriousBroker.it  This test is not yet approved or cleared by the [place]  FDA and has been authorized for detection and/or diagnosis of SARS-CoV-2  by FDA under an Emergency Use Authorization (EUA). This EUA will remain in effect (meaning this test can be used) for the duration of the COVID-19 declaration under Section 564(b)(1) of the Act, 21 U.S.C. section 360bbb-3(b)(1), unless the authorization is terminated or revoked.  Performed at [facility], [address], [place], [place] [address]   CBC with Differential/Platelet     Status: Abnormal   Collection Time: 01/17/24  7:58 PM  Result Value Ref Range   WBC 8.2 4.0 - 10.5 K/uL   RBC 4.87 3.87 - 5.11 MIL/uL   Hemoglobin 14.4 12.0 - 15.0 g/dL   HCT 55.7 63.9 - 53.9 %   MCV 90.8 80.0 - 100.0 fL   MCH 29.6 26.0 - 34.0 pg   MCHC 32.6 30.0 - 36.0 g/dL   RDW 86.4 88.4 - 84.4 %   Platelets 138 (L) 150 - 400 K/uL    Comment: SPECIMEN CHECKED FOR CLOTS PLATELET COUNT CONFIRMED BY SMEAR PLATELETS APPEAR DECREASED    nRBC 0.0 0.0 - 0.2 %   Neutrophils Relative % 67 %   Neutro Abs 5.5 1.7 - 7.7 K/uL   Lymphocytes Relative 16 %   Lymphs Abs 1.3 0.7 - 4.0 K/uL   Monocytes Relative 14 %   Monocytes Absolute 1.1 (H) 0.1 - 1.0 K/uL   Eosinophils Relative 1 %   Eosinophils Absolute 0.1 0.0 - 0.5 K/uL   Basophils Relative 1 %   Basophils Absolute 0.0 0.0 - 0.1 K/uL   Immature Granulocytes 1 %   Abs Immature Granulocytes 0.04 0.00 - 0.07 K/uL    Comment: Performed at [facility], [address] [place], [place] [address]  Comprehensive metabolic panel with GFR     Status: Abnormal   Collection Time: 01/17/24  7:58 PM  Result Value Ref Range   Sodium 137 135 - 145 mmol/L   Potassium 4.1 3.5 - 5.1 mmol/L    Comment: Hemolysis at this level may affect result    Chloride 102 98 - 111 mmol/L   CO2 21 (L) 22 - 32 mmol/L   Glucose, Bld 81 70 - 99 mg/dL    Comment: Glucose reference range applies only to samples taken  after fasting for at least 8 hours.   BUN 7 (L) 8 - 23 mg/dL   Creatinine, Ser 9.46 0.44 - 1.00 mg/dL   Calcium  8.9 8.9 - 10.3  mg/dL   Total Protein 6.7 6.5 - 8.1 g/dL   Albumin 4.0 3.5 - 5.0 g/dL   AST 25 15 - 41 U/L   ALT 22 0 - 44 U/L   Alkaline Phosphatase 103 38 - 126 U/L   Total Bilirubin 0.4 0.0 - 1.2 mg/dL   GFR, Estimated >39 >39 mL/min    Comment: (NOTE) Calculated using the CKD-EPI Creatinine Equation (2021)    Anion gap 14 5 - 15    Comment: Performed at [facility], [address], [place], [place] [address]  Lipase, blood     Status: None   Collection Time: 01/17/24  7:58 PM  Result Value Ref Range   Lipase 16 11 - 51 U/L    Comment: Performed at [facility], [address], [place], [place] [address]       Assessment & Plan Polyarthralgia Impaired mobility and ADLs Patient is seen by Ortho and PMR, who manage these conditions.  She is well controlled with current therapy.   Will defer to them for further changes to plan of care.  Chronic nonintractable headache, unspecified headache type Patient is seen by neurology, who manage this condition.  She is well controlled with current therapy.   Will defer to them for further changes to plan of care.  Mixed hyperlipidemia Checking labs today.  Continue current therapy for lipid control. Will modify as needed based on labwork results.   -CMP w/eGFR -Lipid Panel  Prediabetes A1C Continues to be in prediabetic ranges.  Will reassess at follow up after next lab check.  Patient counseled on dietary choices and verbalized understanding.   -CBC w/Diff -CMP w/eGFR -Hemoglobin A1C  Vitamin D  deficiency, unspecified B12 deficiency due to diet Checking labs today.  Will continue supplements as needed.   - Vitamin D  - Vitamin B12 - TSH     Return in about 3 months (around 06/01/2024).   Total time spent: 20 minutes  ALAN CHRISTELLA ARRANT, FNP  03/02/2024   This document may have been prepared by [facility] Voice Recognition software and as such may include unintentional dictation errors.

## 2024-03-02 NOTE — Assessment & Plan Note (Signed)
 Patient is seen by neurology, who manage this condition.  She is well controlled with current therapy.   Will defer to them for further changes to plan of care.

## 2024-03-02 NOTE — Assessment & Plan Note (Signed)
 Checking labs today.  Continue current therapy for lipid control. Will modify as needed based on labwork results.   -CMP w/eGFR -Lipid Panel

## 2024-03-02 NOTE — Assessment & Plan Note (Signed)
 Patient is seen by Ortho and PMR, who manage these conditions.  She is well controlled with current therapy.   Will defer to them for further changes to plan of care.

## 2024-03-05 LAB — CMP14+EGFR
ALT: 39 IU/L — ABNORMAL HIGH (ref 0–32)
AST: 31 IU/L (ref 0–40)
Albumin: 4.6 g/dL (ref 3.9–4.9)
Alkaline Phosphatase: 123 IU/L (ref 49–135)
BUN/Creatinine Ratio: 15 (ref 12–28)
BUN: 9 mg/dL (ref 8–27)
Bilirubin Total: <0.2 mg/dL (ref 0.0–1.2)
CO2: 20 mmol/L (ref 20–29)
Calcium: 9.5 mg/dL (ref 8.7–10.3)
Chloride: 102 mmol/L (ref 96–106)
Creatinine, Ser: 0.62 mg/dL (ref 0.57–1.00)
Globulin, Total: 2.2 g/dL (ref 1.5–4.5)
Glucose: 91 mg/dL (ref 70–99)
Potassium: 4.5 mmol/L (ref 3.5–5.2)
Sodium: 141 mmol/L (ref 134–144)
Total Protein: 6.8 g/dL (ref 6.0–8.5)
eGFR: 101 mL/min/1.73 (ref >59)

## 2024-03-05 LAB — CBC WITH DIFFERENTIAL/PLATELET

## 2024-03-05 LAB — LIPID PANEL
Chol/HDL Ratio: 2.5 ratio (ref 0.0–4.4)
Cholesterol, Total: 111 mg/dL (ref 100–199)
HDL: 45 mg/dL (ref >39)
LDL Chol Calc (NIH): 48 mg/dL (ref 0–99)
Triglycerides: 96 mg/dL (ref 0–149)
VLDL Cholesterol Cal: 18 mg/dL (ref 5–40)

## 2024-03-05 LAB — VITAMIN B12

## 2024-03-05 LAB — HEMOGLOBIN A1C

## 2024-03-05 LAB — VITAMIN D 25 HYDROXY (VIT D DEFICIENCY, FRACTURES): Vit D, 25-Hydroxy: 55.6 ng/mL (ref 30.0–100.0)

## 2024-03-05 LAB — TSH: TSH: 1.21 u[IU]/mL (ref 0.450–4.500)

## 2024-03-10 ENCOUNTER — Other Ambulatory Visit: Payer: Self-pay | Admitting: Family

## 2024-03-11 ENCOUNTER — Other Ambulatory Visit: Payer: Self-pay

## 2024-03-11 MED ORDER — ATORVASTATIN CALCIUM 40 MG PO TABS
40.0000 mg | ORAL_TABLET | Freq: Every day | ORAL | 1 refills | Status: DC
  Filled 2024-03-11: qty 90, 90d supply, fill #0

## 2024-03-12 ENCOUNTER — Other Ambulatory Visit: Payer: Self-pay

## 2024-03-16 ENCOUNTER — Other Ambulatory Visit: Payer: Self-pay

## 2024-03-19 ENCOUNTER — Telehealth: Payer: Self-pay | Admitting: Family

## 2024-03-19 ENCOUNTER — Other Ambulatory Visit: Payer: Self-pay | Admitting: Family

## 2024-03-19 MED ORDER — ATORVASTATIN CALCIUM 40 MG PO TABS
40.0000 mg | ORAL_TABLET | Freq: Every day | ORAL | 1 refills | Status: AC
Start: 2024-03-19 — End: ?

## 2024-03-19 NOTE — Telephone Encounter (Signed)
 Patient called in stating Dr. Lane said they can do her a DEXA at [facility] but we need to fax them the order. She wants one done because she has never had one. Please place order if appropriate.

## 2024-05-14 ENCOUNTER — Ambulatory Visit: Payer: Self-pay

## 2024-05-14 NOTE — Telephone Encounter (Signed)
-----   Message from CNA [place] O sent at 05/12/2024  4:24 PM EST ----- Per alan verbal please call the patient and see if she  can come back and get the CBC, B12 and A1C checked they were cancelled due to not enough specimen in the tubes also remind the patient to  drink lot of water , otherwise her labs were good  ----- Message ----- From: Interface, [facility] Lab Results In Sent: 03/05/2024  11:35 AM EST To: Alan CHRISTELLA Arrant, FNP

## 2024-05-22 ENCOUNTER — Other Ambulatory Visit

## 2024-06-01 ENCOUNTER — Ambulatory Visit: Admitting: Family

## 2024-06-01 ENCOUNTER — Encounter: Payer: Self-pay | Admitting: Family

## 2024-06-01 DIAGNOSIS — E538 Deficiency of other specified B group vitamins: Secondary | ICD-10-CM

## 2024-06-01 DIAGNOSIS — Z789 Other specified health status: Secondary | ICD-10-CM

## 2024-06-01 DIAGNOSIS — M2550 Pain in unspecified joint: Secondary | ICD-10-CM

## 2024-06-01 DIAGNOSIS — E559 Vitamin D deficiency, unspecified: Secondary | ICD-10-CM

## 2024-06-01 DIAGNOSIS — R7303 Prediabetes: Secondary | ICD-10-CM

## 2024-06-01 DIAGNOSIS — E782 Mixed hyperlipidemia: Secondary | ICD-10-CM

## 2024-06-01 DIAGNOSIS — R519 Headache, unspecified: Secondary | ICD-10-CM

## 2024-06-01 MED ORDER — VALACYCLOVIR HCL 1 G PO TABS: 1000.0000 mg | ORAL_TABLET | Freq: Three times a day (TID) | ORAL | 0 refills | Status: DC

## 2024-06-01 MED ORDER — LINACLOTIDE 290 MCG PO CAPS: 290.0000 ug | ORAL_CAPSULE | Freq: Every day | ORAL | 3 refills | Status: DC

## 2024-06-01 NOTE — Progress Notes (Unsigned)
 "  Established Patient Office Visit  Subjective:  Patient ID: Samantha Banks, female    DOB: Dec 22, 1960  Age: 63 y.o. MRN: [id number]  Chief Complaint  Patient presents with   Follow-up    3 month follow up    HPI  No other concerns at this time.   Past Medical History:  Diagnosis Date   Allergic rhinitis    Arthritis    Atypical chest pain 01/23/2019   Dyspnea 09/20/2014   Headache    chronic migraines    No pertinent past medical history     Past Surgical History:  Procedure Laterality Date   CESAREAN SECTION  1986   CESAREAN SECTION  1990   CESAREAN SECTION  1991   COLONOSCOPY WITH PROPOFOL  N/A 12/17/2016   Procedure: COLONOSCOPY WITH PROPOFOL ;  Surgeon: Gaylyn Gladis PENNER, MD;  Location: [facility] ENDOSCOPY;  Service: Endoscopy;  Laterality: N/A;    Social History   Socioeconomic History   Marital status: Widowed    Spouse name: Not on file   Number of children: 3   Years of education: Not on file   Highest education level: Not on file  Occupational History   Occupation: cna  Tobacco Use   Smoking status: Never   Smokeless tobacco: Never  Vaping Use   Vaping status: Never Used  Substance and Sexual Activity   Alcohol use: No    Alcohol/week: 0.0 standard drinks of alcohol   Drug use: No   Sexual activity: Not Currently    Birth control/protection: None  Other Topics Concern   Not on file  Social History Narrative   Not on file   Social Drivers of Health   Tobacco Use: Low Risk (06/01/2024)   Patient History    Smoking Tobacco Use: Never    Smokeless Tobacco Use: Never    Passive Exposure: Not on file  Financial Resource Strain: High Risk (01/06/2024)   Received from [facility]   Overall Financial Resource Strain (CARDIA)    Difficulty of Paying Living Expenses: Hard  Food Insecurity: Food Insecurity Present (01/06/2024)   Received from [facility]   Epic    Within the past 12  months, you worried that your food would run out before you got the money to buy more.: Often true    Within the past 12 months, the food you bought just didn't last and you didn't have money to get more.: Often true  Transportation Needs: No Transportation Needs (01/06/2024)   Received from [facility] Transportation    In the past 12 months, has lack of transportation kept you from medical appointments or from getting medications?: No    Lack of Transportation (Non-Medical): No  Physical Activity: Not on file  Stress: Not on file  Social Connections: Not on file  Intimate Partner Violence: Not on file  Depression (PHQ2-9): Medium Risk (08/29/2023)   Depression (PHQ2-9)    PHQ-2 Score: 5  Alcohol Screen: Not on file  Housing: High Risk (01/06/2024)   Received from [facility]   Epic    In the last 12 months, was there a time when you were not able to pay the mortgage or rent on time?: Yes    In the past 12 months, how many times have you moved where you were living?: 1    At any time in the past 12 months, were you homeless or living in a shelter (including now)?:  No  Utilities: Not At Risk (01/06/2024)   Received from [facility]   Epic    In the past 12 months has the electric, gas, oil, or water company threatened to shut off services in your home?: No  Health Literacy: Not on file    Family History  Problem Relation Age of Onset   Throat cancer Mother    Osteoarthritis Mother    Hypertension Father    Diabetes Mellitus II Sister    Asthma Sister    Diabetes Daughter    Diabetes Daughter    Ulcers Daughter    Other Neg Hx    Stomach cancer Neg Hx    Pancreatic cancer Neg Hx    Colon cancer Neg Hx     Allergies[1]  Review of Systems  All other systems reviewed and are negative.      Objective:   BP 122/78   Pulse 92   Ht 4' 11 (1.499 m)   Wt 157 lb 9.6 oz (71.5 kg)   LMP  02/01/2012   SpO2 99%   BMI 31.83 kg/m   Vitals:   06/01/24 1426  BP: 122/78  Pulse: 92  Height: 4' 11 (1.499 m)  Weight: 157 lb 9.6 oz (71.5 kg)  SpO2: 99%  BMI (Calculated): 31.81    Physical Exam Vitals and nursing note reviewed.  Constitutional:      Appearance: Normal appearance. She is normal weight.  HENT:     Head: Normocephalic.  Eyes:     Extraocular Movements: Extraocular movements intact.     Conjunctiva/sclera: Conjunctivae normal.     Pupils: Pupils are equal, round, and reactive to light.  Cardiovascular:     Rate and Rhythm: Normal rate.  Pulmonary:     Effort: Pulmonary effort is normal.  Neurological:     General: No focal deficit present.     Mental Status: She is alert and oriented to person, place, and time. Mental status is at baseline.  Psychiatric:        Mood and Affect: Mood normal.        Behavior: Behavior normal.        Thought Content: Thought content normal.      No results found for any visits on 06/01/24.  No results found for this or any previous visit (from the past 2160 hours).     Assessment & Plan:   Assessment & Plan     No follow-ups on file.   Total time spent: {AMA time spent:29001} minutes  ALAN CHRISTELLA ARRANT, FNP  06/01/2024   This document may have been prepared by [facility] Voice Recognition software and as such may include unintentional dictation errors.       [1] Allergies Allergen Reactions   Iodinated Contrast Media Hives    Pt states hives with previous injection of IV contrast, IV benadryl  given 1 hour prior to injection   Latex Rash and Other (See Comments)  "

## 2024-06-02 ENCOUNTER — Ambulatory Visit (INDEPENDENT_AMBULATORY_CARE_PROVIDER_SITE_OTHER)

## 2024-06-02 ENCOUNTER — Ambulatory Visit

## 2024-06-02 DIAGNOSIS — M722 Plantar fascial fibromatosis: Secondary | ICD-10-CM | POA: Diagnosis not present

## 2024-06-02 DIAGNOSIS — M7662 Achilles tendinitis, left leg: Secondary | ICD-10-CM | POA: Diagnosis not present

## 2024-06-02 MED ORDER — METHYLPREDNISOLONE 4 MG PO TBPK: ORAL_TABLET | ORAL | 0 refills | Status: AC

## 2024-06-02 NOTE — Patient Instructions (Signed)

## 2024-06-02 NOTE — Progress Notes (Signed)
 "  Subjective:  Patient ID: Samantha Banks, female    DOB: 22-Dec-1960,  MRN: [id number]  Chief Complaint  Patient presents with   Plantar Fasciitis    Rm2 left foot heel pain    Discussed the use of AI scribe software for clinical note transcription with the patient, who gave verbal consent to proceed.  History of Present Illness Samantha Banks is a 63 year old female who presents with chronic left foot pain.  She has longstanding left foot pain now mainly at the posterior heel, described as soreness worsened by palpation and by most shoes, including boots and heels.  She previously had midfoot and plantar left foot pain, which are now less prominent. She denies current pain in other areas of the foot and has no pain with deep palpation of the plantar aspect.  She recalls atraumatic left foot fractures attributed to bone fragility, treated with a walking boot that gave only temporary relief, though she does not recall timing or specific details.      Review of Systems: Negative except as noted in the HPI. Denies N/V/F/Ch.  Past Medical History:  Diagnosis Date   Allergic rhinitis    Arthritis    Atypical chest pain 01/23/2019   Dyspnea 09/20/2014   Headache    chronic migraines    No pertinent past medical history    Current Medications[1]  Tobacco Use History[2]  Allergies[3] Objective:    Physical Exam MSK: Pain to palpation at the achilles tendon insertion, extending 2 cm proximal. Decreased ankle joint dorsiflexion with knee flexed and extended, indicating gastrocnemius equinus. 5/5 muscle strength.  N/V: Palpable pulses and intact sensation in left foot.  Derm: No open wounds or lesions on left foot.   Radiographs: Taken and reviewed. No plantar or posterior calcaneal spur. Prominence of posterosuperior calcaneus. Minor pes planus foot shape in left foot. Elevation of medial column in longitudinal arch with faulting at navicular cuneiform joint. Increased  talonavicular joint uncoverage angle.      Assessment:   1. Plantar fasciitis of left foot   2. Achilles tendinitis of left lower extremity      Plan:  Patient was evaluated and treated and all questions answered.  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 Imaging showed no heel spur or calcaneal deformity. Aim to restore function and reduce pain non-surgically. - Printed Achilles tendon and gastrocnemius stretching exercises for regular performance. - Provided heel wedges to decrease tension on Achilles insertion during ambulation. - Prescribed Medrol  Dosepak to reduce local inflammation. - Advised use of heel lifts in shoes and continuation of stretching exercises. - Discussed further options including physical therapy or MRI if symptoms persist.  Plantar fasciitis of the left foot Plantar fasciitis with previous midfoot and plantar pain.  - Reinforced importance of stretching exercises due to interrelated tightness in Achilles tendon and plantar fascia.   RTC 3-4 weeks for repeat evaluation  Prentice Ovens, DPM   No follow-ups on file.   Prentice Ovens, DPM AACFAS Fellowship Trained Podiatric Surgeon [facility]     [1]  Current Outpatient Medications:    methylPREDNISolone  (MEDROL  DOSEPAK) 4 MG TBPK tablet, 1 tapered pack oral methylprednisolone , Disp: 1 each, Rfl: 0   aspirin  EC (ASPIRIN  81) 81 MG tablet, Take 1 tablet (81 mg total) by mouth daily., Disp: 90 tablet, Rfl: 1  atorvastatin  (LIPITOR) 40 MG tablet, Take 1 tablet (40 mg total) by mouth at bedtime., Disp: 90 tablet, Rfl: 1   azelastine  (ASTELIN ) 0.1 % nasal spray, Place 2 sprays into both nostrils 2 (two) times daily. Use in each nostril as directed (Patient not taking: Reported on 06/01/2024), Disp: 30 mL, Rfl: 0   benzonatate   (TESSALON ) 100 MG capsule, Take 1 capsule (100 mg total) by mouth every 8 (eight) hours. (Patient not taking: Reported on 06/01/2024), Disp: 21 capsule, Rfl: 0   gabapentin  (NEURONTIN ) 100 MG capsule, Take 1 capsule (100 mg total) by mouth 3 (three) times daily. (Patient not taking: Reported on 06/01/2024), Disp: 90 capsule, Rfl: 3   ibuprofen  (ADVIL ) 600 MG tablet, Take 1 tablet (600 mg total) by mouth every 8 (eight) hours as needed for moderate pain (pain score 4-6) or mild pain (pain score 1-3)., Disp: 90 tablet, Rfl: 0   linaclotide  (LINZESS ) 290 MCG CAPS capsule, Take 1 capsule (290 mcg total) by mouth daily before breakfast., Disp: 30 capsule, Rfl: 3   loratadine  (CLARITIN ) 10 MG tablet, TAKE 1 TABLET BY MOUTH EVERY DAY, Disp: 30 tablet, Rfl: 0   losartan  (COZAAR ) 25 MG tablet, Take 1 tablet (25 mg total) by mouth daily. (Patient not taking: Reported on 06/01/2024), Disp: 90 tablet, Rfl: 1   nortriptyline (PAMELOR) 10 MG capsule, Take 30 mg by mouth at bedtime., Disp: , Rfl:    promethazine -dextromethorphan (PROMETHAZINE -DM) 6.25-15 MG/5ML syrup, Take 5 mLs by mouth at bedtime as needed for cough. (Patient not taking: Reported on 06/01/2024), Disp: 118 mL, Rfl: 0 [2]  Social History Tobacco Use  Smoking Status Never  Smokeless Tobacco Never  [3]  Allergies Allergen Reactions   Iodinated Contrast Media Hives    Pt states hives with previous injection of IV contrast, IV benadryl  given 1 hour prior to injection   Latex Rash and Other (See Comments)   "

## 2024-06-05 LAB — CMP14+EGFR

## 2024-06-05 LAB — VITAMIN B12

## 2024-06-05 LAB — LIPID PANEL

## 2024-06-05 LAB — CBC WITH DIFFERENTIAL/PLATELET
Basophils Absolute: 0.1 x10E3/uL (ref 0.0–0.2)
Basos: 1 %
EOS (ABSOLUTE): 0.3 x10E3/uL (ref 0.0–0.4)
Eos: 4 %
Hematocrit: 41.9 % (ref 34.0–46.6)
Hemoglobin: 13.7 g/dL (ref 11.1–15.9)
Immature Grans (Abs): 0 x10E3/uL (ref 0.0–0.1)
Immature Granulocytes: 0 %
Lymphocytes Absolute: 2.4 x10E3/uL (ref 0.7–3.1)
Lymphs: 28 %
MCH: 30.5 pg (ref 26.6–33.0)
MCHC: 32.7 g/dL (ref 31.5–35.7)
MCV: 93 fL (ref 79–97)
Monocytes Absolute: 0.7 x10E3/uL (ref 0.1–0.9)
Monocytes: 8 %
Neutrophils Absolute: 5 x10E3/uL (ref 1.4–7.0)
Neutrophils: 59 %
Platelets: 233 x10E3/uL (ref 150–450)
RBC: 4.49 x10E6/uL (ref 3.77–5.28)
RDW: 12.5 % (ref 11.7–15.4)
WBC: 8.5 x10E3/uL (ref 3.4–10.8)

## 2024-06-05 LAB — TSH

## 2024-06-05 LAB — HEMOGLOBIN A1C
Est. average glucose Bld gHb Est-mCnc: 120 mg/dL
Hgb A1c MFr Bld: 5.8 % — ABNORMAL HIGH (ref 4.8–5.6)

## 2024-06-05 LAB — VITAMIN D 25 HYDROXY (VIT D DEFICIENCY, FRACTURES)

## 2024-06-08 ENCOUNTER — Other Ambulatory Visit: Payer: Self-pay

## 2024-06-08 ENCOUNTER — Ambulatory Visit: Payer: Self-pay

## 2024-06-08 DIAGNOSIS — E559 Vitamin D deficiency, unspecified: Secondary | ICD-10-CM

## 2024-06-08 DIAGNOSIS — E039 Hypothyroidism, unspecified: Secondary | ICD-10-CM

## 2024-06-08 DIAGNOSIS — E538 Deficiency of other specified B group vitamins: Secondary | ICD-10-CM

## 2024-06-08 DIAGNOSIS — E782 Mixed hyperlipidemia: Secondary | ICD-10-CM

## 2024-06-08 DIAGNOSIS — I10 Essential (primary) hypertension: Secondary | ICD-10-CM

## 2024-06-08 NOTE — Telephone Encounter (Signed)
 Patient left VM stating that she has a question about a medication but did not state which medication or what her question is.

## 2024-06-09 ENCOUNTER — Telehealth: Payer: Self-pay

## 2024-06-09 NOTE — Telephone Encounter (Signed)
 Pt hasn't started her Valacyclovir  that was prescribed to her on 12/22 as pt states she wasn't sure why she was given them and wanted an explanation before taking huge horse pills

## 2024-06-12 IMAGING — DX DG CHEST 2V
2 series · 2 of 2 positions shown · non-contrast
Comparison: Prior chest radiographs 08/04/2021 and earlier.

CLINICAL DATA: Provided history: Patient reports wheezing.

EXAM:
CHEST - 2 VIEW

[chest pa]
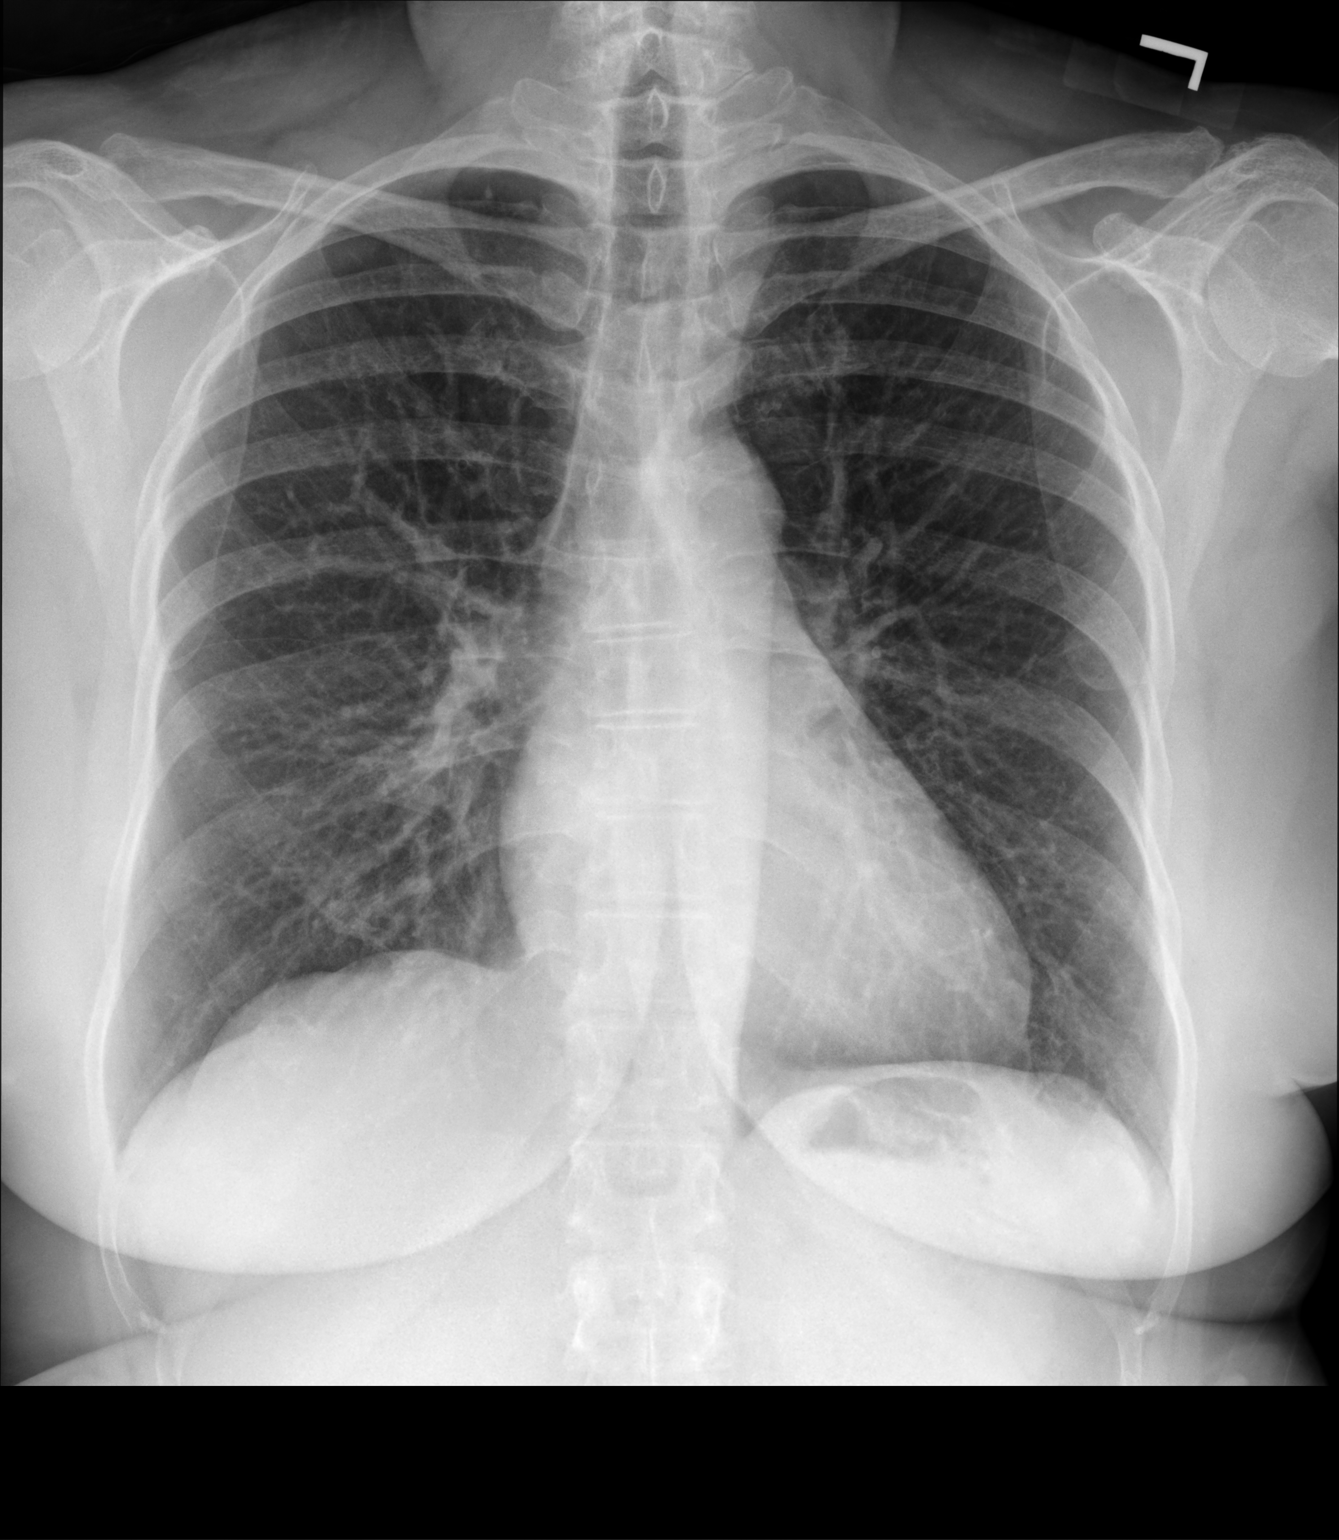

[chest lat]
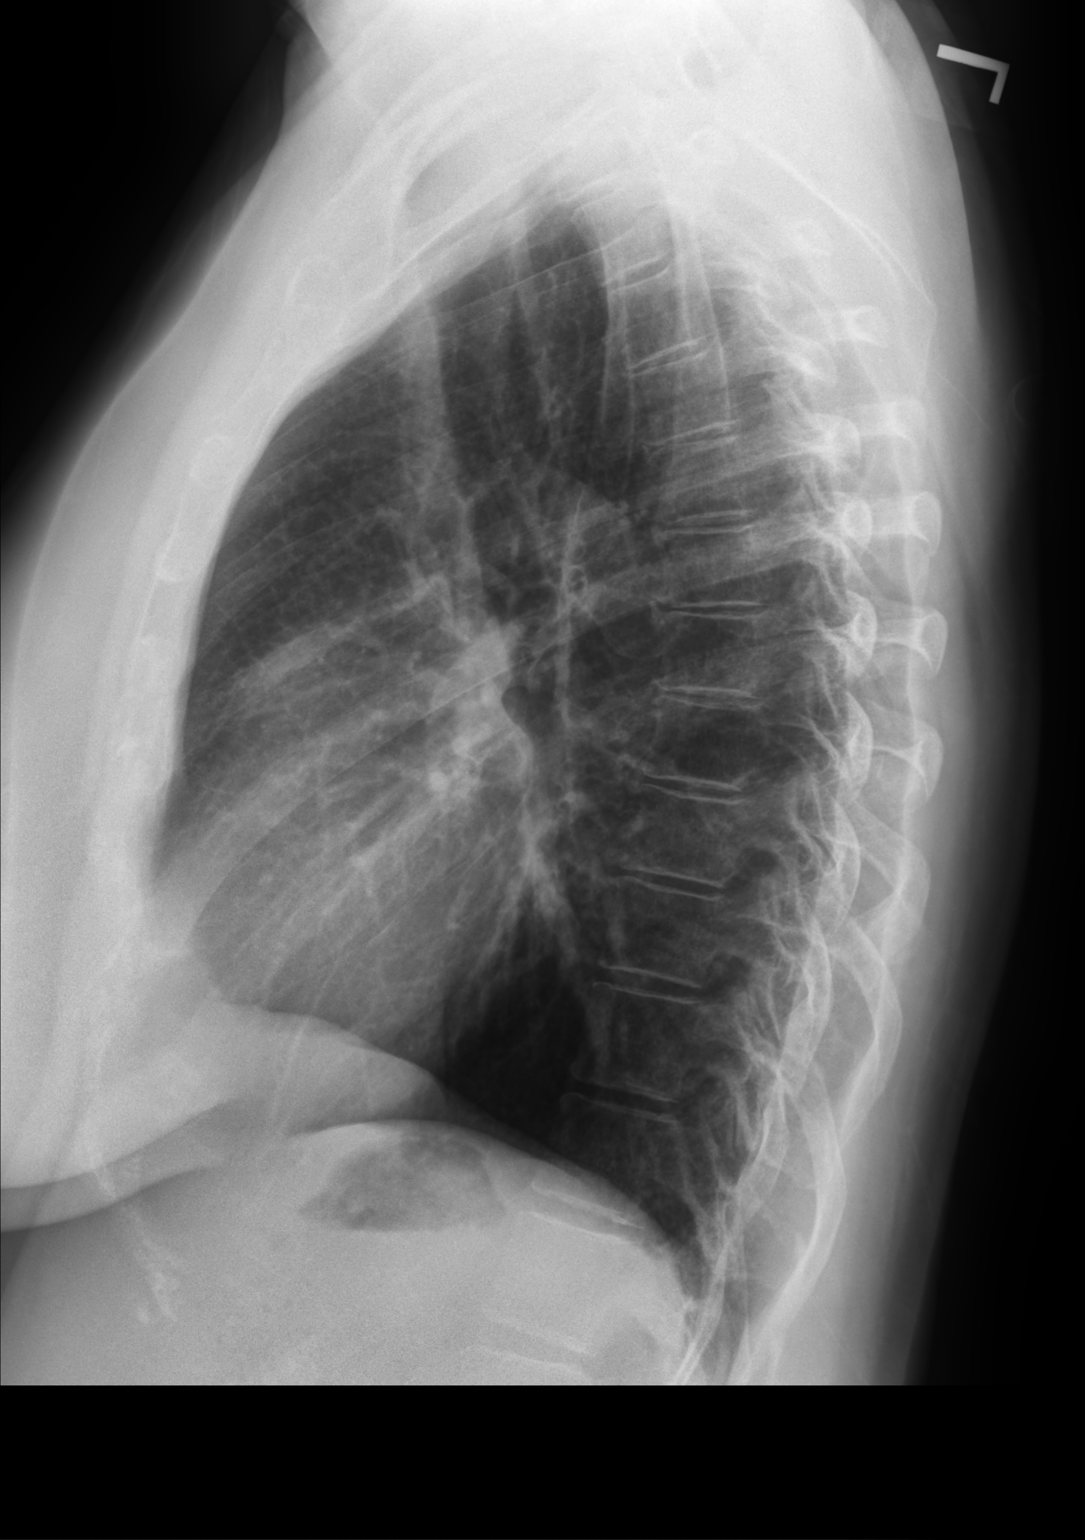

[2 of 2 positions shown; findings below may reference images not displayed]

FINDINGS: Heart size within normal limits. No appreciable airspace
consolidation. No evidence of pleural effusion or pneumothorax. No
acute bony abnormality identified. Mild dextrocurvature of the upper
to midthoracic spine.
IMPRESSION: No evidence of active cardiopulmonary disease.

Mild dextrocurvature of the upper to midthoracic spine.

## 2024-06-12 NOTE — Telephone Encounter (Signed)
 Pt.notified

## 2024-07-07 ENCOUNTER — Ambulatory Visit

## 2024-07-08 ENCOUNTER — Emergency Department (HOSPITAL_COMMUNITY)

## 2024-07-08 ENCOUNTER — Telehealth: Payer: Self-pay

## 2024-07-08 ENCOUNTER — Other Ambulatory Visit: Payer: Self-pay

## 2024-07-08 ENCOUNTER — Emergency Department (HOSPITAL_COMMUNITY)
Admission: EM | Admit: 2024-07-08 | Discharge: 2024-07-09 | Discharge status: Against Medical Advice | Attending: Emergency Medicine | Admitting: Emergency Medicine

## 2024-07-08 DIAGNOSIS — R110 Nausea: Secondary | ICD-10-CM | POA: Diagnosis not present

## 2024-07-08 DIAGNOSIS — Z5321 Procedure and treatment not carried out due to patient leaving prior to being seen by health care provider: Secondary | ICD-10-CM | POA: Insufficient documentation

## 2024-07-08 DIAGNOSIS — R1031 Right lower quadrant pain: Secondary | ICD-10-CM | POA: Insufficient documentation

## 2024-07-08 LAB — CBC WITH DIFFERENTIAL/PLATELET
Abs Immature Granulocytes: 0.05 10*3/uL (ref 0.00–0.07)
Basophils Absolute: 0.1 10*3/uL (ref 0.0–0.1)
Basophils Relative: 1 %
Eosinophils Absolute: 0.3 10*3/uL (ref 0.0–0.5)
Eosinophils Relative: 3 %
HCT: 43 % (ref 36.0–46.0)
Hemoglobin: 14 g/dL (ref 12.0–15.0)
Immature Granulocytes: 1 %
Lymphocytes Relative: 24 %
Lymphs Abs: 2.3 10*3/uL (ref 0.7–4.0)
MCH: 30.3 pg (ref 26.0–34.0)
MCHC: 32.6 g/dL (ref 30.0–36.0)
MCV: 93.1 fL (ref 80.0–100.0)
Monocytes Absolute: 0.9 10*3/uL (ref 0.1–1.0)
Monocytes Relative: 9 %
Neutro Abs: 6.1 10*3/uL (ref 1.7–7.7)
Neutrophils Relative %: 62 %
Platelets: 233 10*3/uL (ref 150–400)
RBC: 4.62 MIL/uL (ref 3.87–5.11)
RDW: 13.7 % (ref 11.5–15.5)
WBC: 9.7 10*3/uL (ref 4.0–10.5)
nRBC: 0 % (ref 0.0–0.2)

## 2024-07-08 LAB — URINALYSIS, ROUTINE W REFLEX MICROSCOPIC
Bacteria, UA: NONE SEEN
Bilirubin Urine: NEGATIVE
Glucose, UA: NEGATIVE mg/dL
Hgb urine dipstick: NEGATIVE
Ketones, ur: NEGATIVE mg/dL
Nitrite: NEGATIVE
Protein, ur: NEGATIVE mg/dL
Specific Gravity, Urine: 1.009 (ref 1.005–1.030)
pH: 6 (ref 5.0–8.0)

## 2024-07-08 LAB — COMPREHENSIVE METABOLIC PANEL WITH GFR
ALT: 56 U/L — ABNORMAL HIGH (ref 0–44)
AST: 39 U/L (ref 15–41)
Albumin: 3.9 g/dL (ref 3.5–5.0)
Alkaline Phosphatase: 100 U/L (ref 38–126)
Anion gap: 11 (ref 5–15)
BUN: 15 mg/dL (ref 8–23)
CO2: 25 mmol/L (ref 22–32)
Calcium: 8.8 mg/dL — ABNORMAL LOW (ref 8.9–10.3)
Chloride: 104 mmol/L (ref 98–111)
Creatinine, Ser: 0.63 mg/dL (ref 0.44–1.00)
GFR, Estimated: >60 mL/min
Glucose, Bld: 106 mg/dL — ABNORMAL HIGH (ref 70–99)
Potassium: 3.9 mmol/L (ref 3.5–5.1)
Sodium: 141 mmol/L (ref 135–145)
Total Bilirubin: <0.2 mg/dL (ref 0.0–1.2)
Total Protein: 6.4 g/dL — ABNORMAL LOW (ref 6.5–8.1)

## 2024-07-08 LAB — LIPASE, BLOOD: Lipase: 23 U/L (ref 11–51)

## 2024-07-08 NOTE — Telephone Encounter (Signed)
 Patient states she is having abdominal pain- since yesterday -pain is worsening.  she takes Linzess  daily- last BM this morning -but she doesn't have a BM daily- sometimes its a little bit of watery stool . Denies vomiting- Has nausea. I scheduled the patient 07/21/24 w/you.Please advise.

## 2024-07-08 NOTE — ED Triage Notes (Addendum)
 Pt arrives via POV. Pt endorses RLQ pain that started yesterday. Tender to touch. Endorses nausea but denies v/d. Hx of this same event and went to GI and they told her she had stool in her colon.

## 2024-07-08 NOTE — Telephone Encounter (Signed)
 Spoke with patient- she was advised to go to ED and I also let her know I would call and mover her appointment up if I get cancellation.  This patient will need to go to the ER if she is having worsening abdominal pain. Looks like she was last seen by Ellouise Console, PA, so since I have not seen her prior, she will need to go to the ER if symptoms are severe. Thank

## 2024-07-08 NOTE — ED Triage Notes (Signed)
 The pt has had abd pain for 2 days with diarrhea

## 2024-07-08 NOTE — ED Provider Triage Note (Signed)
 Emergency Medicine Provider Triage Evaluation Note  Samantha Banks , a 64 y.o. female  was evaluated in triage.  Pt complains of abdominal pain.  Reportedly has been experiencing right lower quadrant pain since yesterday.  Has history of GI issues currently managed with Linzess .  States she has been nausea but no vomiting or diarrhea.  Has had similar symptoms in the past and states that she was advised by GI that this is likely due to increased stool burden.  Review of Systems  Positive: As above Negative: As above  Physical Exam  BP 132/70   Pulse 88   Temp 97.6 F (36.4 C)   Resp 17   Ht 4' 11 (1.499 m)   Wt 69.9 kg   LMP 02/01/2012   SpO2 98%   BMI 31.10 kg/m  Gen:   Awake, no distress   Resp:  Normal effort  MSK:   Moves extremities without difficulty  Other:  TTP in the RLQ. Guarding noted. Slight suprapubic pain as well.  Medical Decision Making  Medically screening exam initiated at 8:58 PM.  Appropriate orders placed.  Mirian A Dufner was informed that the remainder of the evaluation will be completed by another provider, this initial triage assessment does not replace that evaluation, and the importance of remaining in the ED until their evaluation is complete.    Imogen Maddalena A, [name] 07/08/24 2100

## 2024-07-09 ENCOUNTER — Ambulatory Visit: Admitting: Family Medicine

## 2024-07-09 ENCOUNTER — Other Ambulatory Visit
Admission: RE | Admit: 2024-07-09 | Discharge: 2024-07-09 | Disposition: A | Discharge status: Home / Self Care | Source: Ambulatory Visit | Attending: Family Medicine | Admitting: Family Medicine

## 2024-07-09 ENCOUNTER — Other Ambulatory Visit: Payer: Self-pay

## 2024-07-09 ENCOUNTER — Encounter: Payer: Self-pay | Admitting: Family Medicine

## 2024-07-09 VITALS — BP 118/80 | HR 87 | Temp 97.8°F | Ht 59.0 in | Wt 159.0 lb

## 2024-07-09 DIAGNOSIS — R1032 Left lower quadrant pain: Secondary | ICD-10-CM | POA: Diagnosis not present

## 2024-07-09 DIAGNOSIS — R1084 Generalized abdominal pain: Secondary | ICD-10-CM

## 2024-07-09 DIAGNOSIS — K5909 Other constipation: Secondary | ICD-10-CM

## 2024-07-09 DIAGNOSIS — K5900 Constipation, unspecified: Secondary | ICD-10-CM

## 2024-07-09 DIAGNOSIS — Z860100 Personal history of colon polyps, unspecified: Secondary | ICD-10-CM

## 2024-07-09 MED ORDER — PEG 3350-KCL-NA BICARB-NACL 420 G PO SOLR: 4000.0000 mL | Freq: Once | ORAL | 0 refills | Status: AC

## 2024-07-09 MED ORDER — LUBIPROSTONE 8 MCG PO CAPS: 8.0000 ug | ORAL_CAPSULE | Freq: Two times a day (BID) | ORAL | 1 refills | Status: AC

## 2024-07-09 NOTE — Progress Notes (Signed)
 "   07/09/2024 Samantha Banks [id number] [birth year]  Gastroenterology Office Note    Referring Provider: Orlean Alan HERO, FNP Primary Care Physician:  Orlean Alan HERO, FNP  Primary GI Provider: Celestia Rima, NP; Jinny Carmine, MD    Chief Complaint   Chief Complaint  Patient presents with   New Patient (Initial Visit)    RLQ pain worsening over the past 3 days-sometimes constipation-had BM -mushy     History of Present Illness   Samantha Banks is a 64 y.o. female presenting today due to abdominal pain.  Discussed the use of AI scribe software for clinical note transcription with the patient, who gave verbal consent to proceed.  Chronic abdominal pain and constipation have persisted for over a year, with prior evaluation and partial improvement after treatment. Symptoms recurred and intensified recently. Currently, right lower quadrant abdominal pain is persistent, rated 7 out of 10, present for several months and worsening over the past few days. Pain is described as deep, sometimes present with standing or walking. She reports bloating and a sensation of something is sitting in a bag inside her abdomen. Pain improves after a bowel movement. No recent injury or strenuous activity reported.  In April 2025, a CT scan revealed moderate stool burden, and Linzess  was initiated with partial relief. She feels that Linzess  is no longer working for her. Constipation remains significant, with infrequent, small, hard bowel movements; last occurred yesterday morning. Occasional watery stools occur, but not daily and never more than a few times per day. She denies frequent diarrhea, hematochezia, or melena.  She states that sometimes she may have watery stools but only once a day. No specific food triggers identified, though she avoids milk due to discomfort. Fluid intake is approximately three 16-ounce bottles of water per day. No alcohol consumption. She has tried multiple OTC  laxatives and MiraLAX and reports they were unhelpful. She denies nausea, vomiting, fever, chills, or recent weight loss.  Last colonoscopy was in 2018, and she is overdue for repeat colorectal cancer screening.  Patient seen in the emergency room on 07/08/2024 with right lower quadrant abdominal pain and nausea.  07/08/2024 CT abdomen pelvis Normal appendix with no acute findings in the abdomen or pelvis.   Patient seen by Ellouise Console, PA on 09/19/2023 for evaluation of lower abdominal pain for 6 months.   08/2020 last Abdominal Pelvic CT without contrast (allergic to contrast): Multiple small central mesenteric lymph nodes (enteritis or mesenteric adenitis).  Diverticulosis but no diverticulitis.  No acute abnormality.   12/2016 last Colonoscopy by Dr. Luellen @ [facility]: 1 small 4 mm tubular adenoma and 2 small hyperplastic polyps removed.  Diverticulosis.  7-year repeat colonoscopy due 12/2023.  Past Medical History:  Diagnosis Date   Allergic rhinitis    Arthritis    Atypical chest pain 01/23/2019   Dyspnea 09/20/2014   Headache    chronic migraines    No pertinent past medical history     Past Surgical History:  Procedure Laterality Date   CESAREAN SECTION  1986   CESAREAN SECTION  1990   CESAREAN SECTION  1991   COLONOSCOPY WITH PROPOFOL  N/A 12/17/2016   Procedure: COLONOSCOPY WITH PROPOFOL ;  Surgeon: Gaylyn Gladis PENNER, MD;  Location: [facility] ENDOSCOPY;  Service: Endoscopy;  Laterality: N/A;    Current Outpatient Medications  Medication Sig Dispense Refill   aspirin  EC (ASPIRIN  81) 81 MG tablet Take 1 tablet (81 mg total) by mouth daily. 90 tablet 1   atorvastatin  (LIPITOR)  40 MG tablet Take 1 tablet (40 mg total) by mouth at bedtime. 90 tablet 1   benzonatate  (TESSALON ) 100 MG capsule Take 1 capsule (100 mg total) by mouth every 8 (eight) hours. 21 capsule 0   ibuprofen  (ADVIL ) 600 MG tablet Take 1 tablet (600 mg total) by mouth every 8 (eight) hours as needed for moderate pain  (pain score 4-6) or mild pain (pain score 1-3). 90 tablet 0   loratadine  (CLARITIN ) 10 MG tablet TAKE 1 TABLET BY MOUTH EVERY DAY 30 tablet 0   lubiprostone  (AMITIZA ) 8 MCG capsule Take 1 capsule (8 mcg total) by mouth 2 (two) times daily with a meal. 60 capsule 1   nortriptyline (PAMELOR) 10 MG capsule Take 30 mg by mouth at bedtime.     polyethylene glycol-electrolytes (NULYTELY) 420 g solution Take 4,000 mLs by mouth once for 1 dose. Use as directed for your colonoscopy preparation 4000 mL 0   methylPREDNISolone  (MEDROL  DOSEPAK) 4 MG TBPK tablet 1 tapered pack oral methylprednisolone  1 each 0   No current facility-administered medications for this visit.    Allergies as of 07/09/2024 - Review Complete 07/09/2024  Allergen Reaction Noted   Iodinated contrast media Hives 12/14/2015   Latex Rash and Other (See Comments) 02/02/2012    Family History  Problem Relation Age of Onset   Throat cancer Mother    Osteoarthritis Mother    Hypertension Father    Diabetes Mellitus II Sister    Asthma Sister    Diabetes Daughter    Diabetes Daughter    Ulcers Daughter    Other Neg Hx    Stomach cancer Neg Hx    Pancreatic cancer Neg Hx    Colon cancer Neg Hx     Social History   Socioeconomic History   Marital status: Widowed    Spouse name: Not on file   Number of children: 3   Years of education: Not on file   Highest education level: Not on file  Occupational History   Occupation: cna  Tobacco Use   Smoking status: Never   Smokeless tobacco: Never  Vaping Use   Vaping status: Never Used  Substance and Sexual Activity   Alcohol use: No    Alcohol/week: 0.0 standard drinks of alcohol   Drug use: No   Sexual activity: Not Currently    Birth control/protection: None  Other Topics Concern   Not on file  Social History Narrative   Not on file   Social Drivers of Health   Tobacco Use: Low Risk (07/09/2024)   Patient History    Smoking Tobacco Use: Never    Smokeless  Tobacco Use: Never    Passive Exposure: Not on file  Financial Resource Strain: High Risk (01/06/2024)   Received from [facility]   Overall Financial Resource Strain (CARDIA)    Difficulty of Paying Living Expenses: Hard  Food Insecurity: Food Insecurity Present (01/06/2024)   Received from [facility]   Epic    Within the past 12 months, you worried that your food would run out before you got the money to buy more.: Often true    Within the past 12 months, the food you bought just didn't last and you didn't have money to get more.: Often true  Transportation Needs: No Transportation Needs (01/06/2024)   Received from [facility] - Transportation    In the past 12 months, has lack of transportation kept you from  medical appointments or from getting medications?: No    Lack of Transportation (Non-Medical): No  Physical Activity: Not on file  Stress: Not on file  Social Connections: Not on file  Intimate Partner Violence: Not on file  Depression (PHQ2-9): Medium Risk (08/29/2023)   Depression (PHQ2-9)    PHQ-2 Score: 5  Alcohol Screen: Not on file  Housing: High Risk (01/06/2024)   Received from [facility]   Epic    In the last 12 months, was there a time when you were not able to pay the mortgage or rent on time?: Yes    In the past 12 months, how many times have you moved where you were living?: 1    At any time in the past 12 months, were you homeless or living in a shelter (including now)?: No  Utilities: Not At Risk (01/06/2024)   Received from [facility]   Epic    In the past 12 months has the electric, gas, oil, or water company threatened to shut off services in your home?: No  Health Literacy: Not on file     RELEVANT GI HISTORY, IMAGING AND LABS: CBC    Component Value Date/Time   WBC 9.7 07/08/2024 2106   RBC 4.62 07/08/2024 2106   HGB 14.0 07/08/2024 2106   HGB 13.7  06/01/2024 1535   HCT 43.0 07/08/2024 2106   HCT 41.9 06/01/2024 1535   PLT 233 07/08/2024 2106   PLT 233 06/01/2024 1535   MCV 93.1 07/08/2024 2106   MCV 93 06/01/2024 1535   MCH 30.3 07/08/2024 2106   MCHC 32.6 07/08/2024 2106   RDW 13.7 07/08/2024 2106   RDW 12.5 06/01/2024 1535   LYMPHSABS 2.3 07/08/2024 2106   LYMPHSABS 2.4 06/01/2024 1535   MONOABS 0.9 07/08/2024 2106   EOSABS 0.3 07/08/2024 2106   EOSABS 0.3 06/01/2024 1535   BASOSABS 0.1 07/08/2024 2106   BASOSABS 0.1 06/01/2024 1535   Recent Labs    08/29/23 1518 11/29/23 1545 01/17/24 1958 03/02/24 1536 06/01/24 1535 07/08/24 2106  HGB 13.1 14.0 14.4 CANCELED 13.7 14.0    CMP     Component Value Date/Time   NA 141 07/08/2024 2106   NA CANCELED 06/01/2024 1535   K 3.9 07/08/2024 2106   CL 104 07/08/2024 2106   CO2 25 07/08/2024 2106   GLUCOSE 106 (H) 07/08/2024 2106   BUN 15 07/08/2024 2106   BUN CANCELED 06/01/2024 1535   CREATININE 0.63 07/08/2024 2106   CALCIUM  8.8 (L) 07/08/2024 2106   PROT 6.4 (L) 07/08/2024 2106   PROT CANCELED 06/01/2024 1535   ALBUMIN 3.9 07/08/2024 2106   ALBUMIN CANCELED 06/01/2024 1535   AST 39 07/08/2024 2106   ALT 56 (H) 07/08/2024 2106   ALKPHOS 100 07/08/2024 2106   BILITOT <0.2 07/08/2024 2106   BILITOT CANCELED 06/01/2024 1535   GFRNONAA >60 07/08/2024 2106   GFRAA >60 07/04/2019 0752      Latest Ref Rng & Units 07/08/2024    9:06 PM 06/01/2024    3:35 PM 03/02/2024    3:36 PM  Hepatic Function  Total Protein 6.5 - 8.1 g/dL 6.4  CANCELED  6.8   Albumin 3.5 - 5.0 g/dL 3.9  CANCELED  4.6   AST 15 - 41 U/L 39  CANCELED  31   ALT 0 - 44 U/L 56  CANCELED  39   Alk Phosphatase 38 - 126 U/L 100  CANCELED  123   Total Bilirubin  0.0 - 1.2 mg/dL <9.7  CANCELED  <9.7       Review of Systems   All systems reviewed and negative except where noted in HPI.    Physical Exam  BP 118/80   Pulse 87   Temp 97.8 F (36.6 C)   Ht 4' 11 (1.499 m)   Wt 159 lb (72.1 kg)    LMP 02/01/2012   SpO2 99%   BMI 32.11 kg/m  Patient's last menstrual period was 02/01/2012. General:   Alert and oriented. Pleasant and cooperative. Well-nourished and well-developed. In no acute distress.  Head:  Normocephalic and atraumatic. Eyes:  Without icterus Ears:  Normal auditory acuity. Neck:  Supple; no masses or thyromegaly. Lungs:  Respirations even and unlabored.  Clear throughout to auscultation.   No wheezes, crackles, or rhonchi. No acute distress. Heart:  Regular rate and rhythm; no murmurs, clicks, rubs, or gallops. Abdomen:  Normal bowel sounds.  No bruits.  TTP epigastric, RLQ, Soft, non-distended without masses, hepatosplenomegaly or hernias noted.  No guarding or rebound tenderness. Negative Carnett sign.   Rectal:  Deferred. Msk:  Symmetrical without gross deformities. Normal posture. Extremities:  Without edema. Neurologic:  Alert and  oriented x4;  grossly normal neurologically. Skin:  Intact without significant lesions or rashes. Psych:  Alert and cooperative. Normal mood and affect.   Assessment & Plan   Samantha Banks is a 64 y.o. female presenting today with worsening abdominal pain and constipation.  Chronic abdominal pain and constipation. Worsening RLQ pain. likely irritable bowel syndrome.  Recent CT with no abnormalities. - Ordered celiac panel and CRP. - discontinued Linzess .  Will trial Amitiza  8 mcg BID. - Advised to avoid ibuprofen . - Recommended increasing water intake to four bottles daily. - Consider consider increasing nortriptyline dosage in future  Colorectal cancer screening. Screening overdue. Proceed with colonoscopy in near future: the risks, benefits, and alternatives have been discussed with the patient in detail, which include, but are not limited to: bleeding, infection, perforation & drug reaction. The patient states understanding and desires to proceed. ons.   Follow-up in 6 weeks   Samantha Bohr, DNP, AGNP-C [facility] Gastroenter[facility]  "

## 2024-07-09 NOTE — Patient Instructions (Signed)
 Please go have labs done today  Medication has been sent to your pharmacy for bowel prep.

## 2024-07-09 NOTE — ED Notes (Signed)
 Pt stated she is leaving due to wait time . Pt seen leaving the ED

## 2024-07-10 ENCOUNTER — Ambulatory Visit: Payer: Self-pay | Admitting: Family Medicine

## 2024-07-10 LAB — C-REACTIVE PROTEIN: CRP: <0.5 mg/dL

## 2024-07-13 LAB — MISC LABCORP TEST (SEND OUT): Labcorp test code: 165118

## 2024-07-24 ENCOUNTER — Ambulatory Visit: Admitting: Family Medicine

## 2024-07-29 ENCOUNTER — Ambulatory Visit: Admit: 2024-07-29

## 2024-08-10 ENCOUNTER — Ambulatory Visit: Admitting: Family Medicine

## 2024-08-20 ENCOUNTER — Ambulatory Visit: Admitting: Family Medicine

## 2024-09-01 ENCOUNTER — Encounter: Admitting: Family
